# Patient Record
Sex: Male | Born: 1970 | Race: White | Hispanic: No | Marital: Married | Smoking: Former smoker
Health system: Southern US, Community
[De-identification: ages and names within clinical notes are randomized; demographics above are authoritative.]

## PROBLEM LIST (undated history)

## (undated) DIAGNOSIS — Z9221 Personal history of antineoplastic chemotherapy: Secondary | ICD-10-CM

## (undated) DIAGNOSIS — Z21 Asymptomatic human immunodeficiency virus [HIV] infection status: Secondary | ICD-10-CM

## (undated) DIAGNOSIS — N289 Disorder of kidney and ureter, unspecified: Secondary | ICD-10-CM

## (undated) DIAGNOSIS — I10 Essential (primary) hypertension: Secondary | ICD-10-CM

## (undated) DIAGNOSIS — C801 Malignant (primary) neoplasm, unspecified: Secondary | ICD-10-CM

## (undated) DIAGNOSIS — G629 Polyneuropathy, unspecified: Secondary | ICD-10-CM

## (undated) DIAGNOSIS — B20 Human immunodeficiency virus [HIV] disease: Secondary | ICD-10-CM

## (undated) HISTORY — PX: FINGER SURGERY: SHX640

## (undated) HISTORY — PX: WISDOM TOOTH EXTRACTION: SHX21

## (undated) HISTORY — PX: APPENDECTOMY: SHX54

## (undated) HISTORY — DX: Essential (primary) hypertension: I10

## (undated) HISTORY — PX: SHOULDER SURGERY: SHX246

---

## 2002-03-23 ENCOUNTER — Ambulatory Visit (HOSPITAL_COMMUNITY): Admission: RE | Admit: 2002-03-23 | Discharge: 2002-03-24 | Payer: Self-pay | Admitting: Surgery

## 2002-03-23 ENCOUNTER — Encounter (INDEPENDENT_AMBULATORY_CARE_PROVIDER_SITE_OTHER): Payer: Self-pay | Admitting: *Deleted

## 2004-10-09 ENCOUNTER — Encounter: Admission: RE | Admit: 2004-10-09 | Discharge: 2004-10-09 | Payer: Self-pay | Admitting: General Surgery

## 2008-01-11 ENCOUNTER — Emergency Department (HOSPITAL_COMMUNITY): Admission: EM | Admit: 2008-01-11 | Discharge: 2008-01-11 | Payer: Self-pay | Admitting: Emergency Medicine

## 2010-12-05 NOTE — Op Note (Signed)
NAMEGARRISON, Joshua Zhang                        ACCOUNT NO.:  [id number]   MEDICAL RECORD NO.:  [id number]                   PATIENT TYPE:  OIB   LOCATION:  2899                                 FACILITY:  [facility]   PHYSICIAN:  Velora Heckler, M.D.                DATE OF BIRTH:  03-May-1971   DATE OF PROCEDURE:  03/23/2002  DATE OF DISCHARGE:                                 OPERATIVE REPORT   PREOPERATIVE DIAGNOSIS:  Pilonidal cyst with abscess.   POSTOPERATIVE DIAGNOSIS:  Pilonidal cyst with abscess.   PROCEDURE:  Excision, pilonidal cyst, with drainage of abscess and open  packing.   SURGEON:  Velora Heckler, M.D.   ANESTHESIA:  General per Quita Skye. Krista Blue, M.D.   ESTIMATED BLOOD LOSS:  Minimal.   PREPARATION:  Betadine.   COMPLICATIONS:  None.   INDICATIONS:  The patient is a 40 year old white male who presented to our  office at [facility] on 03/22/02 with pilonidal cyst with  abscess.  The patient was seen and evaluated by Gita Kudo, M.D.  He  was scheduled for surgical intervention on 03/23/02 at [facility].   DESCRIPTION OF PROCEDURE:  The procedure was done in OR #3 at the [facility].  The patient was brought to the operating room and  following induction of general anesthesia, the patient is turned to a prone  position on the operating room table.  The buttocks and natal cleft is  prepped and draped in the usual strict aseptic fashion.  After ascertaining  that an adequate level of anesthesia had been obtained, an elliptical  incision is made in the natal cleft so as to excise the pilonidal sinus  tract in the midline.  Dissection is carried into the subcutaneous tissue.  A large abscess cavity is encountered, tracking into the left buttock.  This  is also excised and the abscess wall debrided.  The pilonidal tissue is  excised down to the sacrum and coccyx.  It is submitted to pathology for  review.  Hemostasis is  obtained with the electrocautery.  The wound is  copiously irrigated with warm saline.  Local field block is placed with  Marcaine with epinephrine.  The wound was packed with Betadine-soaked Kerlix  gauze.  Dry gauze dressings and an ABD pad are placed.  The patient is  awakened from anesthesia and brought to the recovery room in stable  condition.  The patient tolerated the procedure well.                                               Velora Heckler, M.D.    TMG/MEDQ  D:  03/23/2002  T:  03/24/2002  Job:  [id number]   cc:   Teena Irani.  Arlyce Dice, M.D.

## 2010-12-05 NOTE — Op Note (Signed)
   NAMEQASIM, DIVELEY                        ACCOUNT NO.:  [id number]   MEDICAL RECORD NO.:  [id number]                   PATIENT TYPE:  OIB   LOCATION:  2899                                 FACILITY:  [facility]   PHYSICIAN:  Velora Heckler, M.D.                DATE OF BIRTH:  02-27-1971   DATE OF PROCEDURE:  03/23/2002  DATE OF DISCHARGE:                                 OPERATIVE REPORT   ADDENDUM:  I saw him in the office yesterday.  His chart number is [id number].  He underwent excision, pilonidal cyst, with drainage of abscess and open  packing by Gerkin, no assist.  He will go up to the floor.                                               Velora Heckler, M.D.    TMG/MEDQ  D:  03/23/2002  T:  03/24/2002  Job:  [id number]

## 2016-04-22 DIAGNOSIS — B360 Pityriasis versicolor: Secondary | ICD-10-CM | POA: Insufficient documentation

## 2016-11-05 ENCOUNTER — Telehealth: Payer: Self-pay

## 2016-11-05 NOTE — Telephone Encounter (Signed)
Referral received from [facility].   Patient tested positive through testing for life insurance.   His wife tested negative but is interested in Prep.   We will schedule patient for appointment.

## 2016-11-10 ENCOUNTER — Ambulatory Visit: Payer: Self-pay | Admitting: Internal Medicine

## 2016-11-11 ENCOUNTER — Encounter: Payer: Self-pay | Admitting: Internal Medicine

## 2016-11-11 ENCOUNTER — Other Ambulatory Visit (HOSPITAL_COMMUNITY)
Admission: RE | Admit: 2016-11-11 | Discharge: 2016-11-11 | Disposition: A | Discharge status: Home / Self Care | Payer: BLUE CROSS/BLUE SHIELD | Source: Ambulatory Visit | Attending: Internal Medicine | Admitting: Internal Medicine

## 2016-11-11 ENCOUNTER — Ambulatory Visit (INDEPENDENT_AMBULATORY_CARE_PROVIDER_SITE_OTHER): Payer: BLUE CROSS/BLUE SHIELD | Admitting: Internal Medicine

## 2016-11-11 DIAGNOSIS — B20 Human immunodeficiency virus [HIV] disease: Secondary | ICD-10-CM

## 2016-11-11 LAB — CBC
HEMATOCRIT: 45.2 % (ref 38.5–50.0)
Hemoglobin: 15.8 g/dL (ref 13.2–17.1)
MCH: 32.6 pg (ref 27.0–33.0)
MCHC: 35 g/dL (ref 32.0–36.0)
MCV: 93.2 fL (ref 80.0–100.0)
MPV: 9 fL (ref 7.5–12.5)
PLATELETS: 237 10*3/uL (ref 140–400)
RBC: 4.85 MIL/uL (ref 4.20–5.80)
RDW: 12.9 % (ref 11.0–15.0)
WBC: 6.9 10*3/uL (ref 3.8–10.8)

## 2016-11-11 MED ORDER — DARUNAVIR-COBICISTAT 800-150 MG PO TABS: 1.0000 | ORAL_TABLET | Freq: Every day | ORAL | 6 refills | Status: DC

## 2016-11-11 MED ORDER — BICTEGRAVIR-EMTRICITAB-TENOFOV 50-200-25 MG PO TABS: 1.0000 | ORAL_TABLET | Freq: Every day | ORAL | 6 refills | Status: DC

## 2016-11-11 NOTE — Progress Notes (Signed)
HPI: Joshua Zhang is a 46 y.o. male who is here for his initial HIV visit today.   Allergies: Not on File  Vitals: Temp: 98 F (36.7 C) (04/25 1026) Temp Source: Oral (04/25 1026) BP: 163/107 (04/25 1026) Pulse Rate: 87 (04/25 1026)  Past Medical History: No past medical history on file.  Social History: Social History   Social History  . Marital status: Single    Spouse name: N/A  . Number of children: N/A  . Years of education: N/A   Social History Main Topics  . Smoking status: Former Research scientist (life sciences)  . Smokeless tobacco: Never Used  . Alcohol use Yes     Comment: occasionally   . Drug use: No  . Sexual activity: Not Currently     Comment: not for last 3 weeks    Other Topics Concern  . None   Social History Narrative  . None    Previous Regimen: None  Current Regimen: None  Labs: No results found for: HIV1RNAQUANT, HIV1RNAVL, CD4TABS, HEPBSAB, HEPBSAG, HCVAB  CrCl: CrCl cannot be calculated (No order found.).  Lipids: No results found for: CHOL, TRIG, HDL, CHOLHDL, VLDL, LDLCALC  Assessment: Joshua Zhang was recently dx with HIV through the health department as part of the life insurance screening. He was denied coverage because of the test. Since he has no labs, Dr. Megan Zhang will start him on Prezcobix/Biktarvy until we know the genotype. Counseled him on side effects and how to take it. He has [employer]. Both meds will require prior authorization as well has his wife Joshua Zhang for PreP. Her test was neg yesterday.   Recommendations:  Start Prezcobix/Biktarvy Joshua Zhang did his PA  Joshua Zhang, PharmD, BCPS, AAHIVP, CPP Clinical Infectious Disease Pharmacist [place] [facility] 11/11/2016, 12:10 PM

## 2016-11-11 NOTE — Patient Instructions (Signed)
We are starting you on two medications today - Bictarvy and Prezcobix. These medications will need to be taken once a day together. It is best to take them around the same time every day.   Labs today.   Back to see Dr. Megan Salon in 3 weeks.

## 2016-11-11 NOTE — Assessment & Plan Note (Addendum)
I discussed with Joshua Zhang treatment options and side effects, benefits of treatment, adherence to therapy and long-term outcomes.  I discussed the process of HIV acquisition/transmission and informed him of the benefits of treatment in preventing opportunistic infections and end organ damage. Counseled regarding routine HIV follow-up care, routine blood monitoring, importance of medication adherence, safe sex practices including abstinence for the next 4-6 weeks while he is started on therapy and Joshua Zhang starts on Prep. He interacted appropriately and all questions have been answered at this time. He spent time talking with our pharmacist Joshua Zhang regarding successful practices of ART and understands to reach out to our clinic in the future with questions.   We decided to start Joshua Zhang on Bictarvy and Prezcobix today while we await resistance testing. He will receive medications from the Joshua Zhang as of now ([facility]ua Zhang's if insurance requires specialty pharmacy). Will return to clinic in 3 weeks to review lab work and continue HIV education.

## 2016-11-11 NOTE — Progress Notes (Signed)
Patient Active Problem List   Diagnosis Date Noted  . HIV disease ([place]) 11/11/2016    Patient's Medications  New Prescriptions   BICTEGRAVIR-EMTRICITABINE-TENOFOVIR AF (BIKTARVY) 50-200-25 MG TABS TABLET    Take 1 tablet by mouth daily.   DARUNAVIR-COBICISTAT (PREZCOBIX) 800-150 MG TABLET    Take 1 tablet by mouth daily with breakfast. Swallow whole. Do NOT crush, break or chew tablets. Take with food.  Previous Medications   MULTIPLE VITAMIN (MULTIVITAMIN) TABLET    Take 1 tablet by mouth daily.  Modified Medications   No medications on file  Discontinued Medications   No medications on file    Subjective: Joshua Zhang is here today for his first visit for HIV care with his wife, Joshua Zhang. He was tested recently at the [facility] as part of a requirement for life insurance and was found to be positive. 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 He has never been tested to his knowledge in the past. Since learning of his positive test he admits that he has had some occasional episodes of sadness, concern over his wife and their relationship as well as how this will affect his life in the long-run. No other concerns regarding his health at this time. He is currently in a monogamous relationship with his wife and has not engaged in sexual activity in last 3 weeks since learning of his diagnosis. They have not used condoms as Joshua Zhang has an IUD for contraception. He denies any other sexual encounters (including MSM) outside of their marriage as well as denies IV drug use or other high risk behaviors. No other people know of his infection at this time.   Joshua Zhang is anxious to learn of her lab results today and learn about the possibility of getting started on PreP.   Review of Systems: Review of Systems  Constitutional: Negative for chills, fever, malaise/fatigue and weight loss.  HENT: Negative for sore throat.    Eyes: Negative for blurred vision and pain.  Respiratory: Negative for cough, sputum production and shortness of breath.   Cardiovascular: Negative for chest pain and leg swelling.  Gastrointestinal: Negative for abdominal pain, diarrhea and vomiting.  Genitourinary: Negative for dysuria, flank pain and urgency.  Musculoskeletal: Negative for joint pain, myalgias and neck pain.  Skin: Negative for rash.  Neurological: Negative for dizziness and headaches.  Psychiatric/Behavioral: Negative for substance abuse. Depression: Reports episodes of saddness since learning of diagnosis. The patient is nervous/anxious.    History reviewed. No pertinent past medical history.  Social History  Substance Use Topics  . Smoking status: Former Research scientist (life sciences)  . Smokeless tobacco: Never Used     Comment: Smoked in 75s  . Alcohol use Yes     Comment: occasionally     Family History  Problem Relation Age of Onset  . Aortic aneurysm Father     Not on File  Objective:  Vitals:   11/11/16 1026  BP: (!) 163/107  Pulse: 87  Temp: 98 F (36.7 C)  TempSrc: Oral  Weight: 201 lb 6.4 oz (91.4 kg)   There is no height or weight on file to calculate BMI.  Physical Exam  Constitutional: He is oriented to person, place, and time and well-developed, well-nourished, and in no distress.  Seated comfortably in chair today with his wife  HENT:  Mouth/Throat: Mucous membranes are normal. Normal dentition (small chipped tooth). No dental abscesses.  Eyes: No scleral icterus.  Cardiovascular: Normal rate, regular rhythm and normal heart sounds.   Pulmonary/Chest: Effort normal and breath sounds normal.  Abdominal: Soft. He exhibits no distension. There is no tenderness.  Musculoskeletal: Normal range of motion.  Lymphadenopathy:    He has no cervical adenopathy.  Neurological: He is alert and oriented to person, place, and time.  Skin: Skin is warm and dry. No rash noted.  Psychiatric: Mood and affect  normal.  Nervous appearing today but engaged in discussion of his care.   Vitals reviewed.   Lab Results No results found for: WBC, HGB, HCT, MCV, PLT No results found for: CREATININE, BUN, NA, K, CL, CO2 No results found for: ALT, AST, GGT, ALKPHOS, BILITOT  No results found for: CHOL, HDL, LDLCALC, LDLDIRECT, TRIG, CHOLHDL No results found for: HIV1RNAQUANT, HIV1RNAVL, CD4TABS No results found for: HIV1GENOSEQ No results found for: HAV No results found for: HEPBSAG, HEPBSAB No results found for: HCVAB No results found for: CHLAMYDIAWP, N No results found for: GCPROBEAPT No results found for: QUANTGOLD   Problem List Items Addressed This Visit      Other   HIV disease ([place])    I discussed with Joshua Zhang treatment options and side effects, benefits of treatment, adherence to therapy and long-term outcomes.  I discussed the process of HIV acquisition/transmission and informed him of the benefits of treatment in preventing opportunistic infections and end organ damage. Counseled regarding routine HIV follow-up care, routine blood monitoring, importance of medication adherence, safe sex practices including abstinence for the next 4-6 weeks while he is started on therapy and Joshua Zhang starts on Prep. He interacted appropriately and all questions have been answered at this time. He spent time talking with our pharmacist Joshua Zhang regarding successful practices of ART and understands to reach out to our clinic in the future with questions.   We decided to start [facility] on Bictarvy and Prezcobix today while we await resistance testing. He will receive medications from the Joshua Zhang as of now ([facility] Zhang's if insurance requires specialty pharmacy). Will return to clinic in 3 weeks to review lab work and continue HIV education.        Relevant Medications   bictegravir-emtricitabine-tenofovir AF (BIKTARVY) 50-200-25 MG TABS tablet   darunavir-cobicistat (PREZCOBIX) 800-150 MG tablet    Other Relevant Orders   T-helper cell (CD4)- ([facility] clinic only)   HIV 1 RNA quant-no reflex-bld   CBC   Comprehensive metabolic panel   RPR   Lipid panel   HIV-1 genotypr plus   HIV-1 Integrase Genotype   HLA B*[id number]   Urine cytology ancillary only   Quantiferon tb gold assay (blood)   Hepatitis A Ab, Total   Hepatitis B Surface AntiGEN   Hepatitis B Surface AntiBODY   Hepatitis C Antibody      Janene Madeira, MSN, NP-C [facility] Infectious Disease [facility] Cell: [phone] Pager: [phone]  11/11/16 12:20 PM

## 2016-11-12 LAB — COMPREHENSIVE METABOLIC PANEL
ALK PHOS: 82 U/L (ref 40–115)
ALT: 26 U/L (ref 9–46)
AST: 28 U/L (ref 10–40)
Albumin: 4.3 g/dL (ref 3.6–5.1)
BILIRUBIN TOTAL: 0.7 mg/dL (ref 0.2–1.2)
BUN: 17 mg/dL (ref 7–25)
CALCIUM: 9.3 mg/dL (ref 8.6–10.3)
CO2: 20 mmol/L (ref 20–31)
CREATININE: 0.95 mg/dL (ref 0.60–1.35)
Chloride: 105 mmol/L (ref 98–110)
GLUCOSE: 100 mg/dL — AB (ref 65–99)
Potassium: 3.9 mmol/L (ref 3.5–5.3)
SODIUM: 139 mmol/L (ref 135–146)
Total Protein: 7.1 g/dL (ref 6.1–8.1)

## 2016-11-12 LAB — LIPID PANEL
Cholesterol: 243 mg/dL — ABNORMAL HIGH (ref <200)
HDL: 47 mg/dL (ref >40)
LDL CALC: 134 mg/dL — AB (ref <100)
TRIGLYCERIDES: 311 mg/dL — AB (ref <150)
Total CHOL/HDL Ratio: 5.2 Ratio — ABNORMAL HIGH (ref <5.0)
VLDL: 62 mg/dL — AB (ref <30)

## 2016-11-12 LAB — HEPATITIS B SURFACE ANTIBODY,QUALITATIVE: HEP B S AB: NEGATIVE

## 2016-11-12 LAB — URINE CYTOLOGY ANCILLARY ONLY
Chlamydia: NEGATIVE
Neisseria Gonorrhea: NEGATIVE

## 2016-11-12 LAB — RPR

## 2016-11-12 LAB — HEPATITIS B SURFACE ANTIGEN: Hepatitis B Surface Ag: NEGATIVE

## 2016-11-12 LAB — HEPATITIS A ANTIBODY, TOTAL: HEP A TOTAL AB: NONREACTIVE

## 2016-11-12 LAB — HEPATITIS C ANTIBODY: HCV AB: NEGATIVE

## 2016-11-12 MED FILL — BIKTARVY 50-200-25 MG TABS: 50-200-25 | 30 days supply | Qty: 30 | Fill #0

## 2016-11-13 LAB — QUANTIFERON TB GOLD ASSAY (BLOOD)
Interferon Gamma Release Assay: NEGATIVE
Mitogen-Nil: 8.5 IU/mL
QUANTIFERON NIL VALUE: 0.05 [IU]/mL
Quantiferon Tb Ag Minus Nil Value: 0.01 IU/mL

## 2016-11-13 LAB — T-HELPER CELL (CD4) - (RCID CLINIC ONLY)
CD4 % Helper T Cell: 25 % — ABNORMAL LOW (ref 33–55)
CD4 T Cell Abs: 290 /uL — ABNORMAL LOW (ref 400–2700)

## 2016-11-13 LAB — HIV-1 RNA QUANT-NO REFLEX-BLD
HIV 1 RNA Quant: 15400 copies/mL — ABNORMAL HIGH
HIV-1 RNA Quant, Log: 4.19 Log copies/mL — ABNORMAL HIGH

## 2016-11-16 MED FILL — PREZCOBIX 800 MG-150 MG TAB: 800-150 | 30 days supply | Qty: 30 | Fill #0

## 2016-11-19 ENCOUNTER — Encounter: Payer: Self-pay | Admitting: *Deleted

## 2016-11-19 LAB — HIV-1 INTEGRASE GENOTYPE

## 2016-11-19 LAB — HLA B*5701: HLA-B 5701 W/RFLX HLA-B HIGH: POSITIVE — AB

## 2016-11-20 LAB — HIV-1 GENOTYPR PLUS

## 2016-12-01 ENCOUNTER — Encounter: Payer: Self-pay | Admitting: Internal Medicine

## 2016-12-01 ENCOUNTER — Ambulatory Visit (INDEPENDENT_AMBULATORY_CARE_PROVIDER_SITE_OTHER): Payer: BLUE CROSS/BLUE SHIELD | Admitting: Pharmacist Clinician (PhC)/ Clinical Pharmacy Specialist

## 2016-12-01 VITALS — BP 161/99 | HR 99 | Temp 98.7°F | Wt 202.0 lb

## 2016-12-01 DIAGNOSIS — B20 Human immunodeficiency virus [HIV] disease: Secondary | ICD-10-CM

## 2016-12-01 NOTE — Progress Notes (Signed)
HPI: Joshua Zhang is a 46 y.o. male who is here for follow up with [place] after starting his ART.  Allergies: Not on File  Vitals: Temp: 98.7 F (37.1 C) (05/15 0939) Temp Source: Oral (05/15 0939) BP: 161/99 (05/15 0939) Pulse Rate: 99 (05/15 0939)  Past Medical History: No past medical history on file.  Social History: Social History   Social History  . Marital status: Married    Spouse name: [name]  . Number of children: 1  . Years of education: some college    Occupational History  . Sales     Previously professional Chiropodist    Social History Main Topics  . Smoking status: Former Research scientist (life sciences)  . Smokeless tobacco: Never Used     Comment: Smoked in 48s  . Alcohol use Yes     Comment: occasionally   . Drug use: No  . Sexual activity: Not Currently    Partners: Female     Comment: not for last 3 weeks, no condom use   Other Topics Concern  . None   Social History Narrative  . None    Previous Regimen: None  Current Regimen: Prezcobix/Biktarvy  Labs: HIV 1 RNA Quant (copies/mL)  Date Value  11/11/2016 15,400 (H)   CD4 T Cell Abs (/uL)  Date Value  11/11/2016 290 (L)   Hep B S Ab (no units)  Date Value  11/11/2016 NEG   Hepatitis B Surface Ag (no units)  Date Value  11/11/2016 NEGATIVE   HCV Ab (no units)  Date Value  11/11/2016 NEGATIVE    CrCl: CrCl cannot be calculated (Unknown ideal weight.).  Lipids:    Component Value Date/Time   CHOL 243 (H) 11/11/2016 1140   TRIG 311 (H) 11/11/2016 1140   HDL 47 11/11/2016 1140   CHOLHDL 5.2 (H) 11/11/2016 1140   VLDL 62 (H) 11/11/2016 1140   LDLCALC 134 (H) 11/11/2016 1140    Assessment: [place] was recently dx with HIV and is here with his wife. He started on Biktarvy and Prezcobix while waiting for the genotype. His genotype came back with wild type virus. Therefore, we are going to stop the Prezcobix today. He tolerates them fine so far without any side effects. He is going to come  back and see me in 4 wks for labs. I'll schedule him to come back to see Dr. Megan Salon at a later time at that visit. Encourage him to not miss any doses.   Recommendations:  Stop Prezcobix Cont Biktarvy 1 PO qday F/u with me in 4 wks for labs  Onnie Boer, PharmD, BCPS, AAHIVP, CPP Clinical Infectious [place] for Infectious Disease 12/01/2016, 10:03 AM

## 2016-12-15 MED FILL — BIKTARVY 50-200-25 MG TABS: 50-200-25 | 30 days supply | Qty: 30 | Fill #1

## 2016-12-29 ENCOUNTER — Ambulatory Visit (INDEPENDENT_AMBULATORY_CARE_PROVIDER_SITE_OTHER): Payer: BLUE CROSS/BLUE SHIELD | Admitting: Pharmacist Clinician (PhC)/ Clinical Pharmacy Specialist

## 2016-12-29 DIAGNOSIS — B20 Human immunodeficiency virus [HIV] disease: Secondary | ICD-10-CM

## 2016-12-29 NOTE — Patient Instructions (Signed)
Come back and see me in July for labs F/u with Dr. Megan Salon in Sept

## 2016-12-29 NOTE — Progress Notes (Signed)
HPI: Joshua Zhang is a 46 y.o. male who is here for his f/u with pharmacy after a recent start on his ART.   Allergies: Not on File  Vitals:    Past Medical History: No past medical history on file.  Social History: Social History   Social History  . Marital status: Married    Spouse name: [name]  . Number of children: 1  . Years of education: some college    Occupational History  . Sales     Previously professional Chiropodist    Social History Main Topics  . Smoking status: Former Research scientist (life sciences)  . Smokeless tobacco: Never Used     Comment: Smoked in 61s  . Alcohol use Yes     Comment: occasionally   . Drug use: No  . Sexual activity: Not Currently    Partners: Female     Comment: not for last 3 weeks, no condom use   Other Topics Concern  . Not on file   Social History Narrative  . No narrative on file    Previous Regimen: Prezcobix/Biktarvy  Current Regimen: Biktarvy  Labs: HIV 1 RNA Quant (copies/mL)  Date Value  11/11/2016 15,400 (H)   CD4 T Cell Abs (/uL)  Date Value  11/11/2016 290 (L)   Hep B S Ab (no units)  Date Value  11/11/2016 NEG   Hepatitis B Surface Ag (no units)  Date Value  11/11/2016 NEGATIVE   HCV Ab (no units)  Date Value  11/11/2016 NEGATIVE    CrCl: CrCl cannot be calculated (Patient's most recent lab result is older than the maximum 21 days allowed.).  Lipids:    Component Value Date/Time   CHOL 243 (H) 11/11/2016 1140   TRIG 311 (H) 11/11/2016 1140   HDL 47 11/11/2016 1140   CHOLHDL 5.2 (H) 11/11/2016 1140   VLDL 62 (H) 11/11/2016 1140   LDLCALC 134 (H) 11/11/2016 1140    Assessment: [place] is doing very well on his regimen and has not experienced any side effects. He was late for 1 dose by a couple of hours but he didn't miss it. His baseline VL was not that high so I hope he will be suppressed very quickly. He will need to start several series of vaccines soon. (Hep A/B, Menveo, PNA). Bring him back next  month to make sure he is suppressed before an appt in Sept with Dr. Megan Salon.   His wife is on PreP right now. Discussed with him the low risk (none) for transmission if he is suppressed and his wife is on PreP.   Recommendations:  HIV VL, CD4, Bmet F/u in July for VL, vaccines F/u with Dr. Megan Salon in Sept  Edon Hoadley, PharmD, BCPS, [place], [place] for Infectious Disease 12/29/2016, 3:52 PM

## 2016-12-30 LAB — BASIC METABOLIC PANEL
BUN: 18 mg/dL (ref 7–25)
CALCIUM: 9.3 mg/dL (ref 8.6–10.3)
CO2: 23 mmol/L (ref 20–31)
Chloride: 103 mmol/L (ref 98–110)
Creat: 1.43 mg/dL — ABNORMAL HIGH (ref 0.60–1.35)
GLUCOSE: 91 mg/dL (ref 65–99)
POTASSIUM: 3.9 mmol/L (ref 3.5–5.3)
SODIUM: 138 mmol/L (ref 135–146)

## 2016-12-30 LAB — T-HELPER CELL (CD4) - (RCID CLINIC ONLY)
CD4 T CELL ABS: 450 /uL (ref 400–2700)
CD4 T CELL HELPER: 26 % — AB (ref 33–55)

## 2017-01-02 LAB — HIV-1 RNA QUANT-NO REFLEX-BLD
HIV 1 RNA QUANT: NOT DETECTED {copies}/mL
HIV-1 RNA QUANT, LOG: NOT DETECTED {Log_copies}/mL

## 2017-01-11 MED FILL — BIKTARVY 50-200-25 MG TABS: 50-200-25 | 30 days supply | Qty: 30 | Fill #2

## 2017-02-02 ENCOUNTER — Ambulatory Visit (INDEPENDENT_AMBULATORY_CARE_PROVIDER_SITE_OTHER): Payer: BLUE CROSS/BLUE SHIELD | Admitting: Pharmacist Clinician (PhC)/ Clinical Pharmacy Specialist

## 2017-02-02 DIAGNOSIS — B20 Human immunodeficiency virus [HIV] disease: Secondary | ICD-10-CM | POA: Diagnosis not present

## 2017-02-02 DIAGNOSIS — Z23 Encounter for immunization: Secondary | ICD-10-CM | POA: Diagnosis not present

## 2017-02-02 NOTE — Progress Notes (Signed)
HPI: Joshua Zhang is a 46 y.o. male who is here for f/u with pharmacy for his HIV.   Allergies: Not on File  Vitals:    Past Medical History: No past medical history on file.  Social History: Social History   Social History  . Marital status: Married    Spouse name: Joshua Zhang  . Number of children: 1  . Years of education: some college    Occupational History  . Sales     Previously professional Chiropodist    Social History Main Topics  . Smoking status: Former Research scientist (life sciences)  . Smokeless tobacco: Never Used     Comment: Smoked in 51s  . Alcohol use Yes     Comment: occasionally   . Drug use: No  . Sexual activity: Not Currently    Partners: Female     Comment: not for last 3 weeks, no condom use   Other Topics Concern  . Not on file   Social History Narrative  . No narrative on file    Previous Regimen: None  Current Regimen: Biktarvy  Labs: HIV 1 RNA Quant (copies/mL)  Date Value  12/29/2016 <20 NOT DETECTED  11/11/2016 15,400 (H)   CD4 T Cell Abs (/uL)  Date Value  12/29/2016 450  11/11/2016 290 (L)   Hep B S Ab (no units)  Date Value  11/11/2016 NEG   Hepatitis B Surface Ag (no units)  Date Value  11/11/2016 NEGATIVE   HCV Ab (no units)  Date Value  11/11/2016 NEGATIVE    CrCl: CrCl cannot be calculated (Patient's most recent lab result is older than the maximum 21 days allowed.).  Lipids:    Component Value Date/Time   CHOL 243 (H) 11/11/2016 1140   TRIG 311 (H) 11/11/2016 1140   HDL 47 11/11/2016 1140   CHOLHDL 5.2 (H) 11/11/2016 1140   VLDL 62 (H) 11/11/2016 1140   LDLCALC 134 (H) 11/11/2016 1140    Assessment: Joshua Zhang has done extremely well on Biktarvy. He is now suppressed and CD4 has increased to 450. He has not experience any side effects. He has if he could change the timing of his ART to evening. Told him that it would be fine to do.   He might be going to [place] for about 2 wks. He asked if the pharmacy can fill a 90  days supply instead. Advised him that it's not up to the pharmacy but the plan instead. He has about 2 wks left of the med. He can probably refill it in about 7 days. If needed, we probably can get his insurance to do a 2 month supply.   His wife is on PrEP. Explained to him that the risk of transmission is extremely low at this point. Otherwise, he is doing very well. Pharmacy was going to see him until he is suppressed before seeing Dr. Megan Zhang. His next appt will be with Dr. Megan Zhang in Sept.   We are going to give him the PNA, hep A/B, Menveo vaccine at this visit.   Recommendations:  Continue Biktarvy 1 PO qday Hep A/B #1 today PNA23 Menveo #1 F/u with Dr. Megan Zhang in Sept ([place] #2)  Onnie Boer, PharmD, BCPS, AAHIVP, CPP Clinical Infectious [place] for Infectious Disease 02/02/2017, 8:16 PM

## 2017-02-02 NOTE — Patient Instructions (Signed)
Continue your Biktavy Follow up with Dr. Megan Salon in Sept

## 2017-02-11 MED FILL — BIKTARVY 50-200-25 MG TABS: 50-200-25 | 30 days supply | Qty: 30 | Fill #3

## 2017-03-09 MED FILL — BIKTARVY 50-200-25 MG TABS: 50-200-25 | 30 days supply | Qty: 30 | Fill #4

## 2017-03-15 ENCOUNTER — Telehealth: Payer: Self-pay | Admitting: Internal Medicine

## 2017-03-15 ENCOUNTER — Other Ambulatory Visit: Payer: Self-pay | Admitting: Pharmacist

## 2017-03-15 NOTE — Telephone Encounter (Signed)
-----   Message from Darletta Moll, [facility] sent at 03/15/2017  3:34 PM EDT ----- Just FYI -- I saw his wife today for PrEP.  She said he's still having a very difficult time with his diagnosis.  She states some days he just sits and cries the whole day.  She had tried comforting him but states she thinks he would benefit from talking to someone.  He has an appt with you on 9/13 -- just a heads up that he may need some extra support when he's here.  Thanks!

## 2017-03-31 DIAGNOSIS — E785 Hyperlipidemia, unspecified: Secondary | ICD-10-CM | POA: Insufficient documentation

## 2017-03-31 DIAGNOSIS — N289 Disorder of kidney and ureter, unspecified: Secondary | ICD-10-CM | POA: Insufficient documentation

## 2017-04-01 ENCOUNTER — Ambulatory Visit (INDEPENDENT_AMBULATORY_CARE_PROVIDER_SITE_OTHER): Payer: BLUE CROSS/BLUE SHIELD | Admitting: Internal Medicine

## 2017-04-01 ENCOUNTER — Encounter: Payer: Self-pay | Admitting: Internal Medicine

## 2017-04-01 VITALS — BP 149/104 | HR 82 | Temp 98.2°F | Wt 210.0 lb

## 2017-04-01 DIAGNOSIS — Z23 Encounter for immunization: Secondary | ICD-10-CM

## 2017-04-01 DIAGNOSIS — I10 Essential (primary) hypertension: Secondary | ICD-10-CM | POA: Insufficient documentation

## 2017-04-01 DIAGNOSIS — B20 Human immunodeficiency virus [HIV] disease: Secondary | ICD-10-CM | POA: Diagnosis not present

## 2017-04-01 DIAGNOSIS — N289 Disorder of kidney and ureter, unspecified: Secondary | ICD-10-CM

## 2017-04-01 DIAGNOSIS — E785 Hyperlipidemia, unspecified: Secondary | ICD-10-CM

## 2017-04-01 NOTE — Assessment & Plan Note (Signed)
His blood pressure has been elevated all 3 times he has been here in the past few months. He has no known history of hypertension. He will be having an insurance physical work today and will follow up with his primary care provider.

## 2017-04-01 NOTE — Progress Notes (Signed)
Patient Active Problem List   Diagnosis Date Noted  . HIV disease ([place]) 11/11/2016    Priority: High  . Hypertension 04/01/2017  . Renal insufficiency 03/31/2017  . Dyslipidemia 03/31/2017    Patient's Medications  New Prescriptions   No medications on file  Previous Medications   BICTEGRAVIR-EMTRICITABINE-TENOFOVIR AF (BIKTARVY) 50-200-25 MG TABS TABLET    Take 1 tablet by mouth daily.   MULTIPLE VITAMIN (MULTIVITAMIN) TABLET    Take 1 tablet by mouth daily.  Modified Medications   No medications on file  Discontinued Medications   No medications on file    Subjective: Joshua Zhang is in for his routine HIV follow-up visit. He has had no problems obtaining, taking or tolerating his Biktarvy. He takes it each morning about 6:15 AM. He does not recall missing doses. He states that he has had a few occasions where he became very anxious and depressed about living with HIV infection but overall he is doing better. His wife, Joshua Zhang, is taking PrEP.   Review of Systems: Review of Systems  Constitutional: Negative for chills, diaphoresis, fever, malaise/fatigue and weight loss.  HENT: Negative for sore throat.   Respiratory: Negative for cough, sputum production and shortness of breath.   Cardiovascular: Negative for chest pain.  Gastrointestinal: Negative for abdominal pain, diarrhea, heartburn, nausea and vomiting.  Genitourinary: Negative for dysuria and frequency.  Musculoskeletal: Negative for joint pain and myalgias.  Skin: Negative for rash.  Neurological: Negative for dizziness and headaches.  Psychiatric/Behavioral: Positive for depression. Negative for substance abuse. The patient is nervous/anxious.     No past medical history on file.  Social History  Substance Use Topics  . Smoking status: Former Research scientist (life sciences)  . Smokeless tobacco: Never Used     Comment: Smoked in 29s  . Alcohol use Yes     Comment: occasionally     Family History  Problem Relation Age of  Onset  . Aortic aneurysm Father     Not on File  Objective:  Vitals:   04/01/17 0836  BP: (!) 149/104  Pulse: 82  Temp: 98.2 F (36.8 C)  TempSrc: Oral  Weight: 210 lb (95.3 kg)   There is no height or weight on file to calculate BMI.  Physical Exam  Constitutional: He is oriented to person, place, and time.  HENT:  Mouth/Throat: No oropharyngeal exudate.  Eyes: Conjunctivae are normal.  Cardiovascular: Normal rate and regular rhythm.   No murmur heard. Pulmonary/Chest: Effort normal and breath sounds normal.  Abdominal: Soft. He exhibits no mass. There is no tenderness.  Musculoskeletal: Normal range of motion.  Neurological: He is alert and oriented to person, place, and time.  Skin: No rash noted.  Psychiatric: Mood and affect normal.    Lab Results Lab Results  Component Value Date   WBC 6.9 11/11/2016   HGB 15.8 11/11/2016   HCT 45.2 11/11/2016   MCV 93.2 11/11/2016   PLT 237 11/11/2016    Lab Results  Component Value Date   CREATININE 1.43 (H) 12/29/2016   BUN 18 12/29/2016   NA 138 12/29/2016   K 3.9 12/29/2016   CL 103 12/29/2016   CO2 23 12/29/2016    Lab Results  Component Value Date   ALT 26 11/11/2016   AST 28 11/11/2016   ALKPHOS 82 11/11/2016   BILITOT 0.7 11/11/2016    Lab Results  Component Value Date   CHOL 243 (H) 11/11/2016   HDL 47 11/11/2016  LDLCALC 134 (H) 11/11/2016   TRIG 311 (H) 11/11/2016   CHOLHDL 5.2 (H) 11/11/2016   Lab Results  Component Value Date   LABRPR NON REAC 11/11/2016   HIV 1 RNA Quant (copies/mL)  Date Value  12/29/2016 <20 NOT DETECTED  11/11/2016 15,400 (H)   CD4 T Cell Abs (/uL)  Date Value  12/29/2016 450  11/11/2016 290 (L)     Problem List Items Addressed This Visit      High   HIV disease ([place])    His infection has come under excellent control. He will continue Biktarvy in follow-up after lab work in 6 months. He got his second hepatitis B vaccine today.      Relevant Orders    T-helper cell (CD4)- ([facility] clinic only)   HIV 1 RNA quant-no reflex-bld   CBC   Comprehensive metabolic panel   Lipid panel   RPR     Unprioritized   Dyslipidemia    He also has dyslipidemia. He will follow-up with his primary care provider. He is getting regular exercise.      Hypertension    His blood pressure has been elevated all 3 times he has been here in the past few months. He has no known history of hypertension. He will be having an insurance physical work today and will follow up with his primary care provider.      Renal insufficiency        Michel Bickers, MD [facility] for Infectious [place] [facility] [phone] pager   [phone] cell 04/01/2017, 8:56 AM

## 2017-04-01 NOTE — Addendum Note (Signed)
Addended by: Aundria Rud on: 04/01/2017 09:45 AM   Modules accepted: Orders

## 2017-04-01 NOTE — Assessment & Plan Note (Signed)
He also has dyslipidemia. He will follow-up with his primary care provider. He is getting regular exercise.

## 2017-04-01 NOTE — Assessment & Plan Note (Signed)
His infection has come under excellent control. He will continue Biktarvy in follow-up after lab work in 6 months. He got his second hepatitis B vaccine today.

## 2017-04-08 MED FILL — BIKTARVY 50-200-25 MG TABS: 50-200-25 | 30 days supply | Qty: 30 | Fill #5

## 2017-04-26 DIAGNOSIS — Z0000 Encounter for general adult medical examination without abnormal findings: Secondary | ICD-10-CM | POA: Diagnosis not present

## 2017-04-26 DIAGNOSIS — E785 Hyperlipidemia, unspecified: Secondary | ICD-10-CM | POA: Diagnosis not present

## 2017-05-05 ENCOUNTER — Ambulatory Visit: Payer: BLUE CROSS/BLUE SHIELD | Admitting: Internal Medicine

## 2017-05-11 DIAGNOSIS — R1032 Left lower quadrant pain: Secondary | ICD-10-CM | POA: Diagnosis not present

## 2017-05-11 MED FILL — BIKTARVY 50-200-25 MG TABS: 50-200-25 | 30 days supply | Qty: 30 | Fill #6

## 2017-06-03 ENCOUNTER — Other Ambulatory Visit: Payer: Self-pay | Admitting: Internal Medicine

## 2017-06-09 MED FILL — BIKTARVY 50-200-25 MG TABS: 50-200-25 | 30 days supply | Qty: 30 | Fill #0

## 2017-07-07 MED FILL — BIKTARVY 50-200-25 MG TABS: 50-200-25 | 30 days supply | Qty: 30 | Fill #1

## 2017-08-05 MED FILL — BIKTARVY 50-200-25 MG TABS: 50-200-25 | 30 days supply | Qty: 30 | Fill #2

## 2017-08-11 DIAGNOSIS — L720 Epidermal cyst: Secondary | ICD-10-CM | POA: Diagnosis not present

## 2017-08-11 DIAGNOSIS — L03211 Cellulitis of face: Secondary | ICD-10-CM | POA: Diagnosis not present

## 2017-08-13 DIAGNOSIS — L720 Epidermal cyst: Secondary | ICD-10-CM | POA: Diagnosis not present

## 2017-08-13 DIAGNOSIS — L03211 Cellulitis of face: Secondary | ICD-10-CM | POA: Diagnosis not present

## 2017-09-06 MED FILL — BIKTARVY 50-200-25 MG TABS: 50-200-25 | 30 days supply | Qty: 30 | Fill #3

## 2017-09-15 ENCOUNTER — Other Ambulatory Visit: Payer: BLUE CROSS/BLUE SHIELD

## 2017-09-15 DIAGNOSIS — B20 Human immunodeficiency virus [HIV] disease: Secondary | ICD-10-CM

## 2017-09-16 LAB — COMPREHENSIVE METABOLIC PANEL
AG Ratio: 1.5 (calc) (ref 1.0–2.5)
ALKALINE PHOSPHATASE (APISO): 88 U/L (ref 40–115)
ALT: 31 U/L (ref 9–46)
AST: 27 U/L (ref 10–40)
Albumin: 4.2 g/dL (ref 3.6–5.1)
BUN: 20 mg/dL (ref 7–25)
CO2: 27 mmol/L (ref 20–32)
CREATININE: 0.98 mg/dL (ref 0.60–1.35)
Calcium: 9.6 mg/dL (ref 8.6–10.3)
Chloride: 104 mmol/L (ref 98–110)
Globulin: 2.8 g/dL (calc) (ref 1.9–3.7)
Glucose, Bld: 89 mg/dL (ref 65–99)
Potassium: 3.8 mmol/L (ref 3.5–5.3)
Sodium: 140 mmol/L (ref 135–146)
Total Bilirubin: 0.7 mg/dL (ref 0.2–1.2)
Total Protein: 7 g/dL (ref 6.1–8.1)

## 2017-09-16 LAB — LIPID PANEL
CHOL/HDL RATIO: 5.3 (calc) — AB (ref <5.0)
Cholesterol: 253 mg/dL — ABNORMAL HIGH (ref <200)
HDL: 48 mg/dL (ref >40)
LDL Cholesterol (Calc): 145 mg/dL (calc) — ABNORMAL HIGH
NON-HDL CHOLESTEROL (CALC): 205 mg/dL — AB (ref <130)
Triglycerides: 387 mg/dL — ABNORMAL HIGH (ref <150)

## 2017-09-16 LAB — CBC
HEMATOCRIT: 44.7 % (ref 38.5–50.0)
Hemoglobin: 16.1 g/dL (ref 13.2–17.1)
MCH: 33.7 pg — ABNORMAL HIGH (ref 27.0–33.0)
MCHC: 36 g/dL (ref 32.0–36.0)
MCV: 93.5 fL (ref 80.0–100.0)
MPV: 9.8 fL (ref 7.5–12.5)
Platelets: 225 10*3/uL (ref 140–400)
RBC: 4.78 10*6/uL (ref 4.20–5.80)
RDW: 12.2 % (ref 11.0–15.0)
WBC: 6.6 10*3/uL (ref 3.8–10.8)

## 2017-09-16 LAB — RPR: RPR Ser Ql: NONREACTIVE

## 2017-09-17 LAB — T-HELPER CELL (CD4) - (RCID CLINIC ONLY)
CD4 % Helper T Cell: 27 % — ABNORMAL LOW (ref 33–55)
CD4 T Cell Abs: 560 /uL (ref 400–2700)

## 2017-09-17 LAB — HIV-1 RNA QUANT-NO REFLEX-BLD
HIV 1 RNA Quant: <20 copies/mL
HIV-1 RNA Quant, Log: <1.3 Log copies/mL

## 2017-09-29 ENCOUNTER — Encounter: Payer: Self-pay | Admitting: Internal Medicine

## 2017-09-29 ENCOUNTER — Ambulatory Visit (INDEPENDENT_AMBULATORY_CARE_PROVIDER_SITE_OTHER): Payer: BLUE CROSS/BLUE SHIELD | Admitting: Internal Medicine

## 2017-09-29 DIAGNOSIS — N289 Disorder of kidney and ureter, unspecified: Secondary | ICD-10-CM | POA: Diagnosis not present

## 2017-09-29 DIAGNOSIS — B20 Human immunodeficiency virus [HIV] disease: Secondary | ICD-10-CM | POA: Diagnosis not present

## 2017-09-29 DIAGNOSIS — M545 Low back pain, unspecified: Secondary | ICD-10-CM | POA: Insufficient documentation

## 2017-09-29 NOTE — Assessment & Plan Note (Addendum)
His infection is now under excellent control with Biktarvy.  His CD4 count is normal.  His adherence is very good.  I encouraged him to fill out his pill canister so that he will have a supply that he can use if he forgets to take it at home.  I talked to him about the risk-benefit decision of having [name] stop Truvada.  As long as his viral load is undetectable his risk of transmitting HIV remains very low.  The decision to stop will need to be up to them.  I had him meet with Onnie Boer our ID pharmacist.  We recommend against weight loss supplements because of the risk of drug drug interactions with Biktarvy.

## 2017-09-29 NOTE — Assessment & Plan Note (Signed)
His creatinine is back to normal.

## 2017-09-29 NOTE — Progress Notes (Signed)
HPI: Joshua Zhang is a 47 y.o. male who is here to see Dr. Megan Salon for his HIV follow up.  Allergies: No Known Allergies  Vitals: Temp: 98.2 F (36.8 C) (03/13 1528) Temp Source: Oral (03/13 1528) BP: 144/91 (03/13 1528) Pulse Rate: 74 (03/13 1528)  Past Medical History: No past medical history on file.  Social History: Social History   Socioeconomic History  . Marital status: Married    Spouse name: [name]  . Number of children: 1  . Years of education: some college   . Highest education level: None  Social Needs  . Financial resource strain: None  . Food insecurity - worry: None  . Food insecurity - inability: None  . Transportation needs - medical: None  . Transportation needs - non-medical: None  Occupational History  . Occupation: Press photographer    Comment: Previously Sports administrator   Tobacco Use  . Smoking status: Former Research scientist (life sciences)  . Smokeless tobacco: Never Used  . Tobacco comment: Smoked in 87s  Substance and Sexual Activity  . Alcohol use: Yes    Comment: occasionally   . Drug use: Yes    Types: Opium  . Sexual activity: Not Currently    Partners: Female    Comment: not for last 3 weeks, no condom use  Other Topics Concern  . None  Social History Narrative  . None    Previous Regimen: None  Current Regimen: Biktarvy  Labs: HIV 1 RNA Quant (copies/mL)  Date Value  09/15/2017 <20 NOT DETECTED  12/29/2016 <20 NOT DETECTED  11/11/2016 15,400 (H)   CD4 T Cell Abs (/uL)  Date Value  09/15/2017 560  12/29/2016 450  11/11/2016 290 (L)   Hep B S Ab (no units)  Date Value  11/11/2016 NEG   Hepatitis B Surface Ag (no units)  Date Value  11/11/2016 NEGATIVE   HCV Ab (no units)  Date Value  11/11/2016 NEGATIVE    CrCl: Estimated Creatinine Clearance: 109.1 mL/min (by C-G formula based on SCr of 0.98 mg/dL).  Lipids:    Component Value Date/Time   CHOL 253 (H) 09/15/2017 1522   TRIG 387 (H) 09/15/2017 1522   HDL 48  09/15/2017 1522   CHOLHDL 5.3 (H) 09/15/2017 1522   VLDL 62 (H) 11/11/2016 1140   LDLCALC 145 (H) 09/15/2017 1522    Assessment: Zavien is doing very well on his antiretrovirals. He ask Dr. Megan Salon if it is ok to take a new weight loss supplement call Forskolin at [facility]. We have no idea what this supplement is and had to look it up on the herbals interaction database. This compound has the potential to activate the 3A4 enzyme which responsible for metabolizing bictegravir, therefore, theoretically it could decrease the level of bictegravir. Told him to avoid it completely.   Recommendations:  Continue Biktarvy 1 daily Avoid Forskolin supplement  Onnie Boer, PharmD, BCPS, AAHIVP, CPP Clinical Infectious [place] for Infectious Disease 09/29/2017, 4:35 PM

## 2017-09-29 NOTE — Progress Notes (Signed)
Patient Active Problem List   Diagnosis Date Noted  . HIV disease ([place]) 11/11/2016    Priority: High  . Low back pain 09/29/2017  . Hypertension 04/01/2017  . Renal insufficiency 03/31/2017  . Dyslipidemia 03/31/2017    Patient's Medications  New Prescriptions   No medications on file  Previous Medications   BIKTARVY 50-200-25 MG TABS TABLET    TAKE 1 TABLET BY MOUTH ONCE DAILY   MULTIPLE VITAMIN (MULTIVITAMIN) TABLET    Take 1 tablet by mouth daily.  Modified Medications   No medications on file  Discontinued Medications   No medications on file    Subjective: Joshua Zhang is in for his routine HIV follow-up visit.  He has had no problems obtaining, taking or tolerating his Biktarvy.  He recalls missing only one dose since his last visit.  This occurred when he left home for work before taking his medication.  He believes that he took his medication when he got home from work.  He has a small pill canister but he has never filled it up with medication.  He would like to lose 5-10 pounds.  He wants to know if it is okay to take weight loss supplements from [facility].  He recently sustained a mild left lower back strain while exercising.  He has been applying ice but it is still bothering him some.  His wife, [name], remains on Truvada PrEP but he tells me that she wants to know if she can safely come off now.  Review of Systems: Review of Systems  Constitutional: Negative for chills, diaphoresis, fever, malaise/fatigue and weight loss.  HENT: Negative for congestion and sore throat.   Respiratory: Negative for cough, sputum production and shortness of breath.   Cardiovascular: Negative for chest pain.  Gastrointestinal: Negative for abdominal pain, diarrhea, heartburn, nausea and vomiting.  Genitourinary: Negative for dysuria and frequency.  Musculoskeletal: Positive for back pain. Negative for joint pain and myalgias.  Skin: Negative for rash.  Neurological: Negative for  dizziness and headaches.  Psychiatric/Behavioral: Negative for depression and substance abuse. The patient is not nervous/anxious.     No past medical history on file.  Social History   Tobacco Use  . Smoking status: Former Research scientist (life sciences)  . Smokeless tobacco: Never Used  . Tobacco comment: Smoked in 59s  Substance Use Topics  . Alcohol use: Yes    Comment: occasionally   . Drug use: Yes    Types: Opium    Family History  Problem Relation Age of Onset  . Aortic aneurysm Father     No Known Allergies  Health Maintenance  Topic Date Due  . TETANUS/TDAP  01/21/1988  . INFLUENZA VACCINE  02/17/2017  . HIV Screening  Completed    Objective:  Vitals:   09/29/17 1528  BP: (!) 144/91  Pulse: 74  Temp: 98.2 F (36.8 C)  TempSrc: Oral  Weight: 212 lb 4 oz (96.3 kg)  Height: 5\' 11"  (1.803 m)   Body mass index is 29.6 kg/m.  Physical Exam  Constitutional: He is oriented to person, place, and time.  He is in good spirits.  HENT:  Mouth/Throat: No oropharyngeal exudate.  Eyes: Conjunctivae are normal.  Cardiovascular: Normal rate and regular rhythm.  No murmur heard. Pulmonary/Chest: Effort normal and breath sounds normal. He has no wheezes. He has no rales.  Abdominal: Soft. He exhibits no mass. There is no tenderness.  Musculoskeletal: Normal range of motion.  Neurological: He  is alert and oriented to person, place, and time. Gait normal.  Skin: No rash noted.  Psychiatric: Mood and affect normal.    Lab Results Lab Results  Component Value Date   WBC 6.6 09/15/2017   HGB 16.1 09/15/2017   HCT 44.7 09/15/2017   MCV 93.5 09/15/2017   PLT 225 09/15/2017    Lab Results  Component Value Date   CREATININE 0.98 09/15/2017   BUN 20 09/15/2017   NA 140 09/15/2017   K 3.8 09/15/2017   CL 104 09/15/2017   CO2 27 09/15/2017    Lab Results  Component Value Date   ALT 31 09/15/2017   AST 27 09/15/2017   ALKPHOS 82 11/11/2016   BILITOT 0.7 09/15/2017    Lab  Results  Component Value Date   CHOL 253 (H) 09/15/2017   HDL 48 09/15/2017   LDLCALC 145 (H) 09/15/2017   TRIG 387 (H) 09/15/2017   CHOLHDL 5.3 (H) 09/15/2017   Lab Results  Component Value Date   LABRPR NON-REACTIVE 09/15/2017   HIV 1 RNA Quant (copies/mL)  Date Value  09/15/2017 <20 NOT DETECTED  12/29/2016 <20 NOT DETECTED  11/11/2016 15,400 (H)   CD4 T Cell Abs (/uL)  Date Value  09/15/2017 560  12/29/2016 450  11/11/2016 290 (L)     Problem List Items Addressed This Visit      High   HIV disease ([place])    His infection is now under excellent control with Biktarvy.  His CD4 count is normal.  His adherence is very good.  I encouraged him to fill out his pill canister so that he will have a supply that he can use if he forgets to take it at home.  I talked to him about the risk-benefit decision of having [name] stop Truvada.  As long as his viral load is undetectable his risk of transmitting HIV remains very low.  The decision to stop will need to be up to them.  I had him meet with Onnie Boer our ID pharmacist.  We recommend against weight loss supplements because of the risk of drug drug interactions with Biktarvy.      Relevant Orders   CBC   T-helper cell (CD4)- ([facility] clinic only)   Comprehensive metabolic panel   Lipid panel   RPR   HIV 1 RNA quant-no reflex-bld     Unprioritized   Low back pain    I recommended gentle stretching, continued exercise and nonsteroidal anti-inflammatories as needed.      Renal insufficiency    His creatinine is back to normal.           Michel Bickers, MD [facility] for [facility] [phone] pager   [phone] cell 09/29/2017, 4:22 PM

## 2017-09-29 NOTE — Assessment & Plan Note (Signed)
I recommended gentle stretching, continued exercise and nonsteroidal anti-inflammatories as needed.

## 2017-10-04 MED FILL — BIKTARVY 50-200-25 MG TABS: 50-200-25 | 30 days supply | Qty: 30 | Fill #4

## 2017-11-02 MED FILL — BIKTARVY 50-200-25 MG TABS: 50-200-25 | 30 days supply | Qty: 30 | Fill #5

## 2017-11-29 MED FILL — BIKTARVY 50-200-25 MG TABS: 50-200-25 | 30 days supply | Qty: 30 | Fill #6

## 2017-12-31 ENCOUNTER — Other Ambulatory Visit: Payer: Self-pay | Admitting: Internal Medicine

## 2018-01-03 DIAGNOSIS — H1033 Unspecified acute conjunctivitis, bilateral: Secondary | ICD-10-CM | POA: Diagnosis not present

## 2018-01-03 MED FILL — BIKTARVY 50-200-25 MG TABS: 50-200-25 | 30 days supply | Qty: 30 | Fill #0

## 2018-02-02 MED FILL — BIKTARVY 50-200-25 MG TABS: 50-200-25 | 30 days supply | Qty: 30 | Fill #1

## 2018-03-02 MED FILL — BIKTARVY 50-200-25 MG TABS: 50-200-25 | 30 days supply | Qty: 30 | Fill #2

## 2018-04-04 MED FILL — BIKTARVY 50-200-25 MG TABS: 50-200-25 | 30 days supply | Qty: 30 | Fill #3

## 2018-04-27 ENCOUNTER — Telehealth: Payer: Self-pay | Admitting: Pharmacist

## 2018-04-27 NOTE — Telephone Encounter (Signed)
Patient called with some questions.  He is having some diarrhea every now and then with some blood in his stool. He states it has only happened a few times and it is not every day.  I told him that Biktarvy should not cause blood in his stool.  I told him that it could be due to hemorrhoids. Advised him to call his PCP to discuss if it continues.  He has also started a new diet medication and wants to make sure it does not interact.  He will send me a picture of what he is taking so I can do a drug interaction check.  He is doing well otherwise with no other side effects or missed doses.

## 2018-04-28 NOTE — Telephone Encounter (Signed)
There was a moderate interaction between the ingredients in his supplement and his Biktarvy.  I told him to take his Biktarvy in the morning and take the supplement at least 6 hours later, preferable at bedtime. He agreed.

## 2018-05-04 MED FILL — BIKTARVY 50-200-25 MG TABS: 50-200-25 | 30 days supply | Qty: 30 | Fill #4

## 2018-06-02 MED FILL — BIKTARVY 50-200-25 MG TABS: 50-200-25 | 30 days supply | Qty: 30 | Fill #5

## 2018-07-01 DIAGNOSIS — M545 Low back pain: Secondary | ICD-10-CM | POA: Diagnosis not present

## 2018-07-04 MED FILL — BIKTARVY 50-200-25 MG TABS: 50-200-25 | 30 days supply | Qty: 30 | Fill #6

## 2018-07-26 DIAGNOSIS — I10 Essential (primary) hypertension: Secondary | ICD-10-CM | POA: Diagnosis not present

## 2018-07-26 DIAGNOSIS — Z23 Encounter for immunization: Secondary | ICD-10-CM | POA: Diagnosis not present

## 2018-07-26 DIAGNOSIS — Z0000 Encounter for general adult medical examination without abnormal findings: Secondary | ICD-10-CM | POA: Diagnosis not present

## 2018-07-26 DIAGNOSIS — E782 Mixed hyperlipidemia: Secondary | ICD-10-CM | POA: Insufficient documentation

## 2018-08-04 MED FILL — BIKTARVY 50-200-25 MG TABS: 50-200-25 | 30 days supply | Qty: 30 | Fill #7

## 2018-09-01 MED FILL — BIKTARVY 50-200-25 MG TABS: 50-200-25 | 30 days supply | Qty: 30 | Fill #8

## 2018-09-15 ENCOUNTER — Other Ambulatory Visit: Payer: BLUE CROSS/BLUE SHIELD

## 2018-09-15 DIAGNOSIS — B20 Human immunodeficiency virus [HIV] disease: Secondary | ICD-10-CM | POA: Diagnosis not present

## 2018-09-15 DIAGNOSIS — M25521 Pain in right elbow: Secondary | ICD-10-CM | POA: Diagnosis not present

## 2018-09-15 DIAGNOSIS — M7021 Olecranon bursitis, right elbow: Secondary | ICD-10-CM | POA: Diagnosis not present

## 2018-09-16 LAB — T-HELPER CELL (CD4) - (RCID CLINIC ONLY)
CD4 T CELL HELPER: 22 % — AB (ref 33–55)
CD4 T Cell Abs: 350 /uL — ABNORMAL LOW (ref 400–2700)

## 2018-09-18 DIAGNOSIS — L03113 Cellulitis of right upper limb: Secondary | ICD-10-CM | POA: Diagnosis not present

## 2018-09-18 DIAGNOSIS — I10 Essential (primary) hypertension: Secondary | ICD-10-CM | POA: Diagnosis not present

## 2018-09-19 LAB — COMPREHENSIVE METABOLIC PANEL
AG Ratio: 1.5 (calc) (ref 1.0–2.5)
ALBUMIN MSPROF: 4.1 g/dL (ref 3.6–5.1)
ALKALINE PHOSPHATASE (APISO): 81 U/L (ref 36–130)
ALT: 23 U/L (ref 9–46)
AST: 18 U/L (ref 10–40)
BILIRUBIN TOTAL: 1.7 mg/dL — AB (ref 0.2–1.2)
BUN: 12 mg/dL (ref 7–25)
CALCIUM: 9.5 mg/dL (ref 8.6–10.3)
CO2: 26 mmol/L (ref 20–32)
Chloride: 104 mmol/L (ref 98–110)
Creat: 1.16 mg/dL (ref 0.60–1.35)
Globulin: 2.8 g/dL (calc) (ref 1.9–3.7)
Glucose, Bld: 113 mg/dL — ABNORMAL HIGH (ref 65–99)
Potassium: 3.6 mmol/L (ref 3.5–5.3)
Sodium: 139 mmol/L (ref 135–146)
TOTAL PROTEIN: 6.9 g/dL (ref 6.1–8.1)

## 2018-09-19 LAB — CBC
HEMATOCRIT: 43.2 % (ref 38.5–50.0)
HEMOGLOBIN: 15.5 g/dL (ref 13.2–17.1)
MCH: 34 pg — ABNORMAL HIGH (ref 27.0–33.0)
MCHC: 35.9 g/dL (ref 32.0–36.0)
MCV: 94.7 fL (ref 80.0–100.0)
MPV: 10.5 fL (ref 7.5–12.5)
Platelets: 238 10*3/uL (ref 140–400)
RBC: 4.56 10*6/uL (ref 4.20–5.80)
RDW: 11.8 % (ref 11.0–15.0)
WBC: 9.4 10*3/uL (ref 3.8–10.8)

## 2018-09-19 LAB — RPR: RPR: NONREACTIVE

## 2018-09-19 LAB — HIV-1 RNA QUANT-NO REFLEX-BLD
HIV 1 RNA Quant: 21 copies/mL — ABNORMAL HIGH
HIV-1 RNA Quant, Log: 1.32 Log copies/mL — ABNORMAL HIGH

## 2018-09-19 LAB — LIPID PANEL
CHOL/HDL RATIO: 3.3 (calc) (ref <5.0)
CHOLESTEROL: 177 mg/dL (ref <200)
HDL: 54 mg/dL (ref >=40)
LDL Cholesterol (Calc): 104 mg/dL (calc) — ABNORMAL HIGH
NON-HDL CHOLESTEROL (CALC): 123 mg/dL (ref <130)
Triglycerides: 94 mg/dL (ref <150)

## 2018-09-23 ENCOUNTER — Other Ambulatory Visit: Payer: Self-pay | Admitting: Infectious Diseases

## 2018-09-29 ENCOUNTER — Ambulatory Visit (INDEPENDENT_AMBULATORY_CARE_PROVIDER_SITE_OTHER): Payer: BLUE CROSS/BLUE SHIELD | Admitting: Internal Medicine

## 2018-09-29 ENCOUNTER — Encounter: Payer: Self-pay | Admitting: Internal Medicine

## 2018-09-29 ENCOUNTER — Other Ambulatory Visit: Payer: Self-pay

## 2018-09-29 DIAGNOSIS — B20 Human immunodeficiency virus [HIV] disease: Secondary | ICD-10-CM | POA: Diagnosis not present

## 2018-09-29 NOTE — Assessment & Plan Note (Signed)
His infection remains under very good, long-term control.  He will continue Biktarvy and follow-up after lab work in 1 year.

## 2018-09-29 NOTE — Progress Notes (Signed)
Patient Active Problem List   Diagnosis Date Noted  . HIV disease ([place]) 11/11/2016    Priority: High  . Low back pain 09/29/2017  . Hypertension 04/01/2017  . Renal insufficiency 03/31/2017  . Dyslipidemia 03/31/2017    Patient's Medications  New Prescriptions   No medications on file  Previous Medications   AMLODIPINE (NORVASC) 5 MG TABLET    Take by mouth.   ATORVASTATIN (LIPITOR) 10 MG TABLET    Take by mouth.   BIKTARVY 50-200-25 MG TABS TABLET    TAKE 1 TABLET BY MOUTH ONCE DAILY   MULTIPLE VITAMIN (MULTIVITAMIN) TABLET    Take 1 tablet by mouth daily.  Modified Medications   No medications on file  Discontinued Medications   No medications on file    Subjective: Joshua Zhang is in for his routine HIV follow-up visit.  He has had no problems obtaining, taking or tolerating his Biktarvy.  He takes it each morning and never misses a single dose.  He recently saw his primary care provider and was told that his blood pressure and cholesterol were elevated.  He was started on 2 new medications.  He knows that one is amlodipine.  He has had some intermittent bright red blood in his stool recently.  He will be scheduled for a colonoscopy when he turns 21 in July.  His wife is no longer taking Truvada PrEP.  Review of Systems: Review of Systems  Constitutional: Negative for chills, diaphoresis, fever, malaise/fatigue and weight loss.  HENT: Negative for sore throat.   Respiratory: Negative for cough, sputum production and shortness of breath.   Cardiovascular: Negative for chest pain.  Gastrointestinal: Positive for blood in stool. Negative for abdominal pain, diarrhea, heartburn, nausea and vomiting.  Genitourinary: Negative for dysuria and frequency.  Musculoskeletal: Negative for joint pain and myalgias.  Skin: Negative for rash.  Neurological: Negative for dizziness and headaches.  Psychiatric/Behavioral: Negative for depression and substance abuse. The patient is not  nervous/anxious.     No past medical history on file.  Social History   Tobacco Use  . Smoking status: Former Research scientist (life sciences)  . Smokeless tobacco: Never Used  . Tobacco comment: Smoked in 73s  Substance Use Topics  . Alcohol use: Yes    Comment: occasionally   . Drug use: Yes    Types: Opium    Family History  Problem Relation Age of Onset  . Aortic aneurysm Father     No Known Allergies  Health Maintenance  Topic Date Due  . TETANUS/TDAP  01/21/1988  . INFLUENZA VACCINE  02/17/2018  . HIV Screening  Completed    Objective:  Vitals:   09/29/18 1020  BP: (!) 129/93  Pulse: 98  Weight: 198 lb (89.8 kg)   Body mass index is 27.62 kg/m.  Physical Exam Constitutional:      Comments: He is in good spirits.  HENT:     Mouth/Throat:     Pharynx: No oropharyngeal exudate.  Eyes:     Conjunctiva/sclera: Conjunctivae normal.  Cardiovascular:     Rate and Rhythm: Normal rate and regular rhythm.     Heart sounds: No murmur.  Pulmonary:     Effort: Pulmonary effort is normal.     Breath sounds: Normal breath sounds.  Abdominal:     Palpations: Abdomen is soft. There is no mass.     Tenderness: There is no abdominal tenderness.  Musculoskeletal: Normal range of motion.  Skin:  Findings: No rash.  Neurological:     Mental Status: He is alert and oriented to person, place, and time.  Psychiatric:        Mood and Affect: Mood normal.     Lab Results Lab Results  Component Value Date   WBC 9.4 09/15/2018   HGB 15.5 09/15/2018   HCT 43.2 09/15/2018   MCV 94.7 09/15/2018   PLT 238 09/15/2018    Lab Results  Component Value Date   CREATININE 1.16 09/15/2018   BUN 12 09/15/2018   NA 139 09/15/2018   K 3.6 09/15/2018   CL 104 09/15/2018   CO2 26 09/15/2018    Lab Results  Component Value Date   ALT 23 09/15/2018   AST 18 09/15/2018   ALKPHOS 82 11/11/2016   BILITOT 1.7 (H) 09/15/2018    Lab Results  Component Value Date   CHOL 177 09/15/2018   HDL  54 09/15/2018   LDLCALC 104 (H) 09/15/2018   TRIG 94 09/15/2018   CHOLHDL 3.3 09/15/2018   Lab Results  Component Value Date   LABRPR NON-REACTIVE 09/15/2018   HIV 1 RNA Quant (copies/mL)  Date Value  09/15/2018 21 (H)  09/15/2017 <20 NOT DETECTED  12/29/2016 <20 NOT DETECTED   CD4 T Cell Abs (/uL)  Date Value  09/15/2018 350 (L)  09/15/2017 560  12/29/2016 450     Problem List Items Addressed This Visit      High   HIV disease ([place])    His infection remains under very good, long-term control.  He will continue Biktarvy and follow-up after lab work in 1 year.           Michel Bickers, MD [facility] for Infectious [place] [facility] [phone] pager   [phone] cell 09/29/2018, 10:24 AM

## 2018-10-03 MED FILL — BIKTARVY 50-200-25 MG TABS: 50-200-25 | 30 days supply | Qty: 30 | Fill #0

## 2018-10-25 DIAGNOSIS — E782 Mixed hyperlipidemia: Secondary | ICD-10-CM | POA: Diagnosis not present

## 2018-10-25 DIAGNOSIS — I10 Essential (primary) hypertension: Secondary | ICD-10-CM | POA: Diagnosis not present

## 2018-10-31 DIAGNOSIS — I10 Essential (primary) hypertension: Secondary | ICD-10-CM | POA: Diagnosis not present

## 2018-10-31 DIAGNOSIS — E782 Mixed hyperlipidemia: Secondary | ICD-10-CM | POA: Diagnosis not present

## 2018-10-31 MED FILL — BIKTARVY 50-200-25 MG TABS: 50-200-25 | 30 days supply | Qty: 30 | Fill #1

## 2018-11-30 ENCOUNTER — Telehealth: Payer: Self-pay | Admitting: Pharmacy Technician

## 2018-11-30 NOTE — Telephone Encounter (Signed)
[facility] Patient Advocate Encounter   Received notification from [employer] that prior authorization for Joshua Zhang is required.   PA submitted on 11/30/2018 by fax. Status is pending    [facility] will continue to follow.   Joshua Zhang. Joshua Zhang Joshua Zhang Patient [name] for Infectious Disease Phone: [phone] Fax:  [phone]

## 2018-12-02 MED FILL — BIKTARVY 50-200-25 MG TABS: 50-200-25 | 30 days supply | Qty: 30 | Fill #2

## 2018-12-05 NOTE — Telephone Encounter (Signed)
[facility] Patient Advocate Encounter  Prior Authorization for Joshua Zhang has been approved.    PA# [id number] Effective dates: 12/01/2018 through 11/30/2019  Patients co-pay is $200 which is covered by a [place] copay card to make it $0.    Joshua Zhang [place] Patient [facility] for Infectious Disease Phone: [phone] Fax:  [phone]

## 2018-12-29 MED FILL — BIKTARVY 50-200-25 MG TABS: 50-200-25 | 30 days supply | Qty: 30 | Fill #3

## 2019-01-26 MED FILL — BIKTARVY 50-200-25 MG TABS: 50-200-25 | 30 days supply | Qty: 30 | Fill #4

## 2019-03-01 MED FILL — BIKTARVY 50-200-25 MG TABS: 50-200-25 | 30 days supply | Qty: 30 | Fill #5

## 2019-03-03 DIAGNOSIS — S46391A Other injury of muscle, fascia and tendon of triceps, right arm, initial encounter: Secondary | ICD-10-CM | POA: Diagnosis not present

## 2019-03-03 DIAGNOSIS — S46309A Unspecified injury of muscle, fascia and tendon of triceps, unspecified arm, initial encounter: Secondary | ICD-10-CM | POA: Insufficient documentation

## 2019-03-17 DIAGNOSIS — M25521 Pain in right elbow: Secondary | ICD-10-CM | POA: Diagnosis not present

## 2019-03-24 DIAGNOSIS — S46391D Other injury of muscle, fascia and tendon of triceps, right arm, subsequent encounter: Secondary | ICD-10-CM | POA: Diagnosis not present

## 2019-03-28 MED FILL — BIKTARVY 50-200-25 MG TABS: 50-200-25 | 30 days supply | Qty: 30 | Fill #6

## 2019-04-14 DIAGNOSIS — S46391D Other injury of muscle, fascia and tendon of triceps, right arm, subsequent encounter: Secondary | ICD-10-CM | POA: Diagnosis not present

## 2019-04-25 MED FILL — BIKTARVY 50-200-25 MG TABS: 50-200-25 | 30 days supply | Qty: 30 | Fill #7

## 2019-05-01 DIAGNOSIS — I10 Essential (primary) hypertension: Secondary | ICD-10-CM | POA: Diagnosis not present

## 2019-05-01 DIAGNOSIS — Z23 Encounter for immunization: Secondary | ICD-10-CM | POA: Diagnosis not present

## 2019-05-01 DIAGNOSIS — E782 Mixed hyperlipidemia: Secondary | ICD-10-CM | POA: Diagnosis not present

## 2019-05-01 DIAGNOSIS — Z1211 Encounter for screening for malignant neoplasm of colon: Secondary | ICD-10-CM | POA: Diagnosis not present

## 2019-05-23 MED FILL — BIKTARVY 50-200-25 MG TABS: 50-200-25 | 30 days supply | Qty: 30 | Fill #8

## 2019-06-20 DIAGNOSIS — B20 Human immunodeficiency virus [HIV] disease: Secondary | ICD-10-CM | POA: Diagnosis not present

## 2019-06-20 DIAGNOSIS — E669 Obesity, unspecified: Secondary | ICD-10-CM | POA: Diagnosis not present

## 2019-06-20 DIAGNOSIS — Z1211 Encounter for screening for malignant neoplasm of colon: Secondary | ICD-10-CM | POA: Diagnosis not present

## 2019-06-20 DIAGNOSIS — K625 Hemorrhage of anus and rectum: Secondary | ICD-10-CM | POA: Diagnosis not present

## 2019-06-22 ENCOUNTER — Other Ambulatory Visit: Payer: Self-pay | Admitting: Infectious Diseases

## 2019-06-24 ENCOUNTER — Other Ambulatory Visit: Payer: Self-pay

## 2019-06-24 DIAGNOSIS — Z20822 Contact with and (suspected) exposure to covid-19: Secondary | ICD-10-CM

## 2019-06-26 MED FILL — BIKTARVY 50-200-25 MG TABS: 50-200-25 | 30 days supply | Qty: 30 | Fill #0

## 2019-06-27 LAB — NOVEL CORONAVIRUS, NAA: SARS-CoV-2, NAA: NOT DETECTED

## 2019-07-03 DIAGNOSIS — D123 Benign neoplasm of transverse colon: Secondary | ICD-10-CM | POA: Diagnosis not present

## 2019-07-03 DIAGNOSIS — K625 Hemorrhage of anus and rectum: Secondary | ICD-10-CM | POA: Diagnosis not present

## 2019-07-03 DIAGNOSIS — K635 Polyp of colon: Secondary | ICD-10-CM | POA: Diagnosis not present

## 2019-07-03 DIAGNOSIS — D122 Benign neoplasm of ascending colon: Secondary | ICD-10-CM | POA: Diagnosis not present

## 2019-07-03 DIAGNOSIS — D124 Benign neoplasm of descending colon: Secondary | ICD-10-CM | POA: Diagnosis not present

## 2019-07-03 DIAGNOSIS — Z1211 Encounter for screening for malignant neoplasm of colon: Secondary | ICD-10-CM | POA: Diagnosis not present

## 2019-07-03 DIAGNOSIS — D125 Benign neoplasm of sigmoid colon: Secondary | ICD-10-CM | POA: Diagnosis not present

## 2019-07-03 DIAGNOSIS — C20 Malignant neoplasm of rectum: Secondary | ICD-10-CM | POA: Diagnosis not present

## 2019-07-04 ENCOUNTER — Other Ambulatory Visit: Payer: Self-pay | Admitting: Gastroenterology

## 2019-07-04 DIAGNOSIS — R7301 Impaired fasting glucose: Secondary | ICD-10-CM | POA: Diagnosis not present

## 2019-07-04 DIAGNOSIS — I10 Essential (primary) hypertension: Secondary | ICD-10-CM | POA: Diagnosis not present

## 2019-07-04 DIAGNOSIS — K6389 Other specified diseases of intestine: Secondary | ICD-10-CM

## 2019-07-04 DIAGNOSIS — E782 Mixed hyperlipidemia: Secondary | ICD-10-CM | POA: Diagnosis not present

## 2019-07-04 DIAGNOSIS — C20 Malignant neoplasm of rectum: Secondary | ICD-10-CM | POA: Diagnosis not present

## 2019-07-05 ENCOUNTER — Other Ambulatory Visit: Payer: Self-pay | Admitting: Gastroenterology

## 2019-07-05 DIAGNOSIS — K6389 Other specified diseases of intestine: Secondary | ICD-10-CM

## 2019-07-06 ENCOUNTER — Other Ambulatory Visit: Payer: Self-pay | Admitting: Gastroenterology

## 2019-07-06 DIAGNOSIS — C20 Malignant neoplasm of rectum: Secondary | ICD-10-CM

## 2019-07-12 ENCOUNTER — Other Ambulatory Visit: Payer: BLUE CROSS/BLUE SHIELD

## 2019-07-12 ENCOUNTER — Other Ambulatory Visit: Payer: Self-pay

## 2019-07-12 ENCOUNTER — Ambulatory Visit
Admission: RE | Admit: 2019-07-12 | Discharge: 2019-07-12 | Disposition: A | Discharge status: Home / Self Care | Payer: BC Managed Care – PPO | Source: Ambulatory Visit | Attending: Gastroenterology | Admitting: Gastroenterology

## 2019-07-12 DIAGNOSIS — K6389 Other specified diseases of intestine: Secondary | ICD-10-CM

## 2019-07-12 DIAGNOSIS — J8410 Pulmonary fibrosis, unspecified: Secondary | ICD-10-CM | POA: Diagnosis not present

## 2019-07-12 MED ORDER — IOPAMIDOL (ISOVUE-300) INJECTION 61%
100.0000 mL | Freq: Once | INTRAVENOUS | Status: AC | PRN
  Administered 2019-07-12: 100 mL via INTRAVENOUS

## 2019-07-26 MED FILL — BIKTARVY 50-200-25 MG TABS: 50-200-25 | 30 days supply | Qty: 30 | Fill #1

## 2019-08-02 ENCOUNTER — Other Ambulatory Visit: Payer: Self-pay

## 2019-08-02 ENCOUNTER — Ambulatory Visit
Admission: RE | Admit: 2019-08-02 | Discharge: 2019-08-02 | Disposition: A | Discharge status: Home / Self Care | Payer: BC Managed Care – PPO | Source: Ambulatory Visit | Attending: Gastroenterology | Admitting: Gastroenterology

## 2019-08-02 DIAGNOSIS — C20 Malignant neoplasm of rectum: Secondary | ICD-10-CM

## 2019-08-15 DIAGNOSIS — C20 Malignant neoplasm of rectum: Secondary | ICD-10-CM | POA: Diagnosis not present

## 2019-08-16 ENCOUNTER — Telehealth: Payer: Self-pay

## 2019-08-16 NOTE — Telephone Encounter (Signed)
Patient called office today to inform MD he was recently diagnosed with cancer. Patient would like to know if there would be any contraindications between [facility] and radiation treatment. Patient is not sure when he will begin radiation treatment.   [place]

## 2019-08-16 NOTE — Telephone Encounter (Signed)
Not that I am aware of. If he starts chemo, he will need to check back with [place] for drug-drug interactions.

## 2019-08-17 ENCOUNTER — Telehealth: Payer: Self-pay | Admitting: Hematology

## 2019-08-17 NOTE — Progress Notes (Signed)
[place]   [phone] [phone]   Clinic New Consult Note   Patient Care Team: Merwyn Katos as PCP - General (Physician Assistant)  Date of Service:  08/21/2019   CHIEF COMPLAINTS/PURPOSE OF CONSULTATION:  Newly Diagnosed Rectal Cancer   REFERRING PHYSICIAN:  Dr. Marcello Moores  Oncology History Overview Note  Cancer Staging Rectal adenocarcinoma [facility]) Staging form: Colon and Rectum, AJCC 8th Edition - Clinical stage from 08/21/2019: Stage IIIB (cT3, cN1, cM0) - Signed by Truitt Merle, MD on 08/21/2019    Rectal adenocarcinoma ([place])  07/03/2019 Procedure   Colonoscopy by Dr. Collene Mares  IMPRESSION -Likely Malignant 5cm, circumferential, bleeding tumor in the rectum, 304 cm from the anal verge -biopsy done  -One 8 mm sessile polyp in the distal sigmoid colon, removed with a hot snare x2; resected and retrieved.  -Two small sessile polyps, 1 in the mid-descending colon and 1 in the mid transverse colon - removed by cold biopsies.  -One 31m sessile polyp in the mid transverse colon, removed with a hot snare x1; resected and retrieved.  -One 83msessile polyp in the proximal ascending colon, removed with a hot snare X1; resected and retrieved.  -few large scattered diverticula.   07/03/2019 Initial Biopsy   FINAL DIAGNOSIS 07/03/19  A. Colon, Descending, Polyp, Polypectomy:   -TUBULAR ADENOMA   -No high grade dysplasia or malignancy.  B. Colon, transverse, polyp, polypectomy:   -TUBULAR ADENOMA  -No high grade dysplasia or malignancy. C. Colon, Ascending, Polyp, Polypectomy:   -Fragments of sessile serrated adenoma/polyp D. Colon, sigmoid, polyp, polypectomy:   -Fragments of Tubular Adenoma  -No high grade dysplasia or malignancy. E. Rectum, Mass, Biopsy:   -INVASIVE COLONIC ADENOCARCINOMA, MODERATELY-DIFFERENTIATED, see comment     07/12/2019 Imaging   CT AP W Contrast 07/12/19  IMPRESSION: 1. No evidence of metastatic disease within  the chest, abdomen or pelvis. 2. Possible mild wall thickening in the sigmoid colon, although no focal colonic mass is identified. There are few distal colonic diverticula. 3. Sequela of prior granulomatous disease. 4. Possible cholelithiasis with mild wall thickening of the gallbladder fundus. 5. Aortic Atherosclerosis (ICD10-I70.0).   08/21/2019 Initial Diagnosis   [place]  08/21/2019 Cancer Staging   Staging form: Colon and Rectum, AJCC 8th Edition - Clinical stage from 08/21/2019: Stage IIIB (cT3, cN1, cM0) - Signed by FeTruitt MerleMD on 08/21/2019      HISTORY OF PRESENTING ILLNESS:  Joshua Doom49o. male is a here because of newly diagnosed rectal cancer. The patient was referred by Dr. ThMarcello MooresThe patient presents to the clinic today accompanied by hie wife [name].   He has had intermittent rectal bleeding over the past 2 years. This has not worsened. He notes ongoing loose stool once a day lately. He denies N&V and is able to eat adequately. He notes stable and adequate energy level. He denies any other recent changes.  He went for his first screening colonoscopy with Dr. MaCollene Maresnd a mass was found. He had several polyps removed and exam showed diverticulosis. One of his polyps was found to be cancerous.    Socially he is married. He does not have biological children but 1 stepdaughter. 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 He has h/o smoking intermittently for 2 years in the past. He drinks wine or beer moderately in 1 week with 12-15 drinks totally a week. He feels he has no issues to stop. He denies recreational or IV drug  use.   They have a PMHx of HTN, on medication. His BP is normal when calm. He notes he has HIV and is on treatment with Biktavy. This is managed by Dr. Megan Salon. He notes he had left shoulder in 1993 due to injury and appendectomy in the past. His mother had breast cancer diagnosed at 48.    REVIEW OF SYSTEMS:    Constitutional: Denies  fevers, chills or abnormal night sweats Eyes: Denies blurriness of vision, double vision or watery eyes Ears, nose, mouth, throat, and face: Denies mucositis or sore throat Respiratory: Denies cough, dyspnea or wheezes Cardiovascular: Denies palpitation, chest discomfort or lower extremity swelling Gastrointestinal:  Denies nausea, heartburn (+) intermittent rectal bleeding (+) Loose stool  Skin: Denies abnormal skin rashes Lymphatics: Denies new lymphadenopathy or easy bruising Neurological:Denies numbness, tingling or new weaknesses Behavioral/Psych: Mood is stable, no new changes  All other systems were reviewed with the patient and are negative.   MEDICAL HISTORY:  Past Medical History:  Diagnosis Date  . Hypertension     SURGICAL HISTORY: Past Surgical History:  Procedure Laterality Date  . APPENDECTOMY     49 years old   . SHOULDER SURGERY Bilateral 49 years old    SOCIAL HISTORY: Social History   Socioeconomic History  . Marital status: Married    Spouse name: [name]  . Number of children: 1  . Years of education: some college   . Highest education level: Not on file  Occupational History  . Occupation: Press photographer    Comment: Previously Sports administrator   Tobacco Use  . Smoking status: Never Smoker  . Smokeless tobacco: Never Used  . Tobacco comment: Smoked in 49s  Substance and Sexual Activity  . Alcohol use: Yes    Alcohol/week: 24.0 standard drinks    Types: 12 Glasses of wine, 12 Cans of beer per week  . Drug use: Yes    Types: Opium  . Sexual activity: Not Currently    Partners: Female    Comment: not for last 3 weeks, no condom use  Other Topics Concern  . Not on file  Social History Narrative  . Not on file   Social Determinants of Health   Financial Resource Strain:   . Difficulty of Paying Living Expenses: Not on file  Food Insecurity:   . Worried About Charity fundraiser in the Last Year: Not on file  . Ran Out of Food in the Last  Year: Not on file  Transportation Needs:   . Lack of Transportation (Medical): Not on file  . Lack of Transportation (Non-Medical): Not on file  Physical Activity:   . Days of Exercise per Week: Not on file  . Minutes of Exercise per Session: Not on file  Stress:   . Feeling of Stress : Not on file  Social Connections:   . Frequency of Communication with Friends and Family: Not on file  . Frequency of Social Gatherings with Friends and Family: Not on file  . Attends Religious Services: Not on file  . Active Member of Clubs or Organizations: Not on file  . Attends Archivist Meetings: Not on file  . Marital Status: Not on file  Intimate Partner Violence:   . Fear of Current or Ex-Partner: Not on file  . Emotionally Abused: Not on file  . Physically Abused: Not on file  . Sexually Abused: Not on file    FAMILY HISTORY: Family History  Problem Relation Age of Onset  .  Cancer Mother 42       breasst cancer  . Aortic aneurysm Father     ALLERGIES:  has No Known Allergies.  MEDICATIONS:  Current Outpatient Medications  Medication Sig Dispense Refill  . amLODipine (NORVASC) 5 MG tablet Take by mouth.    Marland Kitchen atorvastatin (LIPITOR) 10 MG tablet Take by mouth.    Marland Kitchen BIKTARVY 50-200-25 MG TABS tablet TAKE 1 TABLET BY MOUTH ONCE DAILY 30 tablet 5  . Multiple Vitamin (MULTIVITAMIN) tablet Take 1 tablet by mouth daily.     No current facility-administered medications for this visit.    PHYSICAL EXAMINATION: ECOG PERFORMANCE STATUS: 0 - Asymptomatic  Vitals:   08/21/19 1502  BP: (!) 171/97  Pulse: 69  Resp: 18  Temp: 98 F (36.7 C)  SpO2: 100%   Filed Weights   08/21/19 1502  Weight: 212 lb 1.6 oz (96.2 kg)    GENERAL:alert, no distress and comfortable SKIN: skin color, texture, turgor are normal, no rashes or significant lesions EYES: normal, Conjunctiva are pink and non-injected, sclera clear  NECK: supple, thyroid normal size, non-tender, without  nodularity LYMPH:  no palpable lymphadenopathy in the cervical, axillary or inguinal LUNGS: clear to auscultation and percussion with normal breathing effort HEART: regular rate & rhythm and no murmurs and no lower extremity edema ABDOMEN:abdomen soft, non-tender and normal bowel sounds Musculoskeletal:no cyanosis of digits and no clubbing  NEURO: alert & oriented x 3 with fluent speech, no focal motor/sensory deficits RECTAL: I was only able to get into about 3cm due to sphincter contraction, no palpable mass in low rectum or anal canal. No blood on glove.   LABORATORY DATA:  I have reviewed the data as listed CBC Latest Ref Rng & Units 09/15/2018 09/15/2017 11/11/2016  WBC 3.8 - 10.8 Thousand/uL 9.4 6.6 6.9  Hemoglobin 13.2 - 17.1 g/dL 15.5 16.1 15.8  Hematocrit 38.5 - 50.0 % 43.2 44.7 45.2  Platelets 140 - 400 Thousand/uL 238 225 237    CMP Latest Ref Rng & Units 09/15/2018 09/15/2017 12/29/2016  Glucose 65 - 99 mg/dL 113(H) 89 91  BUN 7 - 25 mg/dL '12 20 18  '$ Creatinine 0.60 - 1.35 mg/dL 1.16 0.98 1.43(H)  Sodium 135 - 146 mmol/L 139 140 138  Potassium 3.5 - 5.3 mmol/L 3.6 3.8 3.9  Chloride 98 - 110 mmol/L 104 104 103  CO2 20 - 32 mmol/L '26 27 23  '$ Calcium 8.6 - 10.3 mg/dL 9.5 9.6 9.3  Total Protein 6.1 - 8.1 g/dL 6.9 7.0 -  Total Bilirubin 0.2 - 1.2 mg/dL 1.7(H) 0.7 -  Alkaline Phos 40 - 115 U/L - - -  AST 10 - 40 U/L 18 27 -  ALT 9 - 46 U/L 23 31 -   His recent CBC and CMP at [facility] from 07/04/2019 reviewed, unremarkable   Will get his base CEA from Dr. Lorie Apley office   RADIOGRAPHIC STUDIES: I have personally reviewed the radiological images as listed and agreed with the findings in the report. MR PELVIS WO CONTRAST  Result Date: 08/02/2019 CLINICAL DATA:  Newly diagnosed rectal carcinoma.  Local staging. EXAM: MRI PELVIS WITHOUT CONTRAST TECHNIQUE: Multiplanar multisequence MR imaging of the pelvis was performed. No intravenous contrast was administered. Small amount of [place] gel was  administered per rectum to optimize tumor evaluation. COMPARISON:  None. FINDINGS: TUMOR LOCATION Tumor distance from Anal Verge/Skin Surface:  5.0 cm Tumor distance to Internal Anal Sphincter: 0 cm TUMOR DESCRIPTION Circumferential Extent: Right anterior rectal wall, from 10-1  o'clock positions Tumor Length: 3.6 cm T - CATEGORY Extension through Muscularis Propria: Yes, measuring approximately 9 mm= T3c Shortest Distance of any tumor/node from Mesorectal Fascia: 7 mm from right lateral perirectal lymph node to adjacent mesorectal fascia (image 16/4) Extramural Vascular Invasion/Tumor Thrombus: No Invasion of Anterior Peritoneal Reflection: No Involvement of Adjacent Organs or Pelvic Sidewall: No Levator Ani Involvement: No N - CATEGORY Mesorectal Lymph Nodes >=27m: 6 mm lymph node in right perirectal space on image 16/4, and 5 mm lymph node in left perirectal space =N1 Extra-mesorectal Lymphadenopathy: No Other:  None. IMPRESSION: Rectal adenocarcinoma T stage: T3c Rectal adenocarcinoma N stage:  N1 Distance from tumor to the internal anal sphincter is 0 cm. Electronically Signed   By: JMarlaine HindM.D.   On: 08/02/2019 11:26    ASSESSMENT & PLAN:  BBERNAL LUHMANis a 49y.o. male with a history of HLD, HTN, Tinea Versicolor, HIV positive   1. Low rectal adenocarcinoma, cT3N1M0, Stage IIIB -He presented with intermittent rectal bleeding for 2 years, but otherwise asymptomatic, this was discovered on screening colonoscopy.   -We reviewed and discussed his image findings and pathology report in great detail with patient and his wife.  -colonoscopy and scan shows he has low rectum mass which is close to sphincter. Biopsy from screening colonoscopy showed this is moderately-differentiated adenocarcinoma locally advanced. Based on MRI, this is cT3N1 stage III disease.  -I recommend total neoadjuvant chemotherapy and chemoRT  before proceeding with curative surgery. He was seen by colorectal surgeon Dr.  TMarcello Mooreswho agrees with neoadjuvant chemoRT  -He plans to consult with Dr. MLisbeth Renshawon 08/22/19.  -I discussed neoadjuvant treatment with chemo FOLFOX q2weeks for 8 cycles or CAPOX with oral Xeloda q3weeks for 5 cycles before proceeding concurrent chemo RT with Xeloda. I will check if there is interaction between Xeloda and his HIV meds  --Chemotherapy consent: Side effects including but does not limited to, fatigue, nausea, vomiting, diarrhea, hair loss, cold sensitivity, neuropathy, fluid retention, renal and kidney dysfunction, neutropenic fever, needed for blood transfusion, bleeding, were discussed with patient in great detail. He is interested in chemo and will think about which option to proceed with. Plan to start next week. I gave print out of medications.  -The goal of chemotherapy is curative -I discussed option to forego surgery if he has complete response to neoadjuvant treatment, but given young age and low possibility of complete response from chemo and radiation, I recommend surgery which is the standard care now.  -Will discuss his case this week's GI Tumor board later this week.  -Given he is on Biktavy, will given growth factor after chemo to reduce his risk of infection if he gets FOLFOX -We discussed chemo-induced infertility, he does not plan to have children.  I strongly encourage him to use condom for contraceptive and protect his partner  -He will have chemo education class before start of treatment.  -I will obtain MMR to rule out Lynch syndrome  -I reviewed Labs from PCP on 07/04/19.  -plan to start chemo later next week   2. Rectal bleeding, secondary to #1 -Has been intermittent and stable for the last 2 years.  -He has had loose stool once daily lately.  -He did not have anemia on 07/04/19 labs with PCP. Will monitor on neoadjuvant treatment.    3. HIV, controlled  -He denies h/o or current use of recreational or IV drugs. He is unsure how he initially got infected.    -Controlled  and undetectable. 09/15/18 CD4 was 350.  -He is on Biktavy. This is managed by Dr. Megan Salon.  -I will check for any drug interaction with his chemo.  -I recommend he f/u with ID physician for updated CD4 level.    4. HLD, HTN -He will continue medications and f/u with PCP    PLAN:  -Chemo education class in 1-2 weeks  -he will think about FOLFOX vs CAPOX, I will check the drug interaction between Xeloda and Biiktarvy -Lab, F/u and chemo next week. -will copy Frs. Fredda Hammed and [place]    No orders of the defined types were placed in this encounter.   All questions were answered. The patient knows to call the clinic with any problems, questions or concerns. The total time spent in the appointment was 60 minutes.     Truitt Merle, MD 08/21/2019 10:52 PM  I, Joslyn Devon, am acting as scribe for Truitt Merle, MD.   I have reviewed the above documentation for accuracy and completeness, and I agree with the above.

## 2019-08-17 NOTE — Telephone Encounter (Signed)
Received a new pt referral from Dr. Marcello Moores for rectal cancer. Mr. Joshua Zhang has been scheduled to see Dr. Burr Medico on 2/1 at 3pm. Appt date and time was given to the pt's wife. She's been made aware for them to arrive 15 minutes early.

## 2019-08-21 ENCOUNTER — Telehealth: Payer: Self-pay

## 2019-08-21 ENCOUNTER — Telehealth: Payer: Self-pay | Admitting: Hematology

## 2019-08-21 ENCOUNTER — Inpatient Hospital Stay: Payer: BC Managed Care – PPO | Attending: Hematology | Admitting: Hematology

## 2019-08-21 ENCOUNTER — Other Ambulatory Visit: Payer: Self-pay

## 2019-08-21 ENCOUNTER — Encounter: Payer: Self-pay | Admitting: Hematology

## 2019-08-21 DIAGNOSIS — Z21 Asymptomatic human immunodeficiency virus [HIV] infection status: Secondary | ICD-10-CM | POA: Diagnosis not present

## 2019-08-21 DIAGNOSIS — Z803 Family history of malignant neoplasm of breast: Secondary | ICD-10-CM | POA: Diagnosis not present

## 2019-08-21 DIAGNOSIS — R5383 Other fatigue: Secondary | ICD-10-CM | POA: Insufficient documentation

## 2019-08-21 DIAGNOSIS — E785 Hyperlipidemia, unspecified: Secondary | ICD-10-CM | POA: Insufficient documentation

## 2019-08-21 DIAGNOSIS — D124 Benign neoplasm of descending colon: Secondary | ICD-10-CM | POA: Insufficient documentation

## 2019-08-21 DIAGNOSIS — Z5111 Encounter for antineoplastic chemotherapy: Secondary | ICD-10-CM | POA: Diagnosis not present

## 2019-08-21 DIAGNOSIS — D125 Benign neoplasm of sigmoid colon: Secondary | ICD-10-CM | POA: Diagnosis not present

## 2019-08-21 DIAGNOSIS — D122 Benign neoplasm of ascending colon: Secondary | ICD-10-CM | POA: Diagnosis not present

## 2019-08-21 DIAGNOSIS — Z7289 Other problems related to lifestyle: Secondary | ICD-10-CM | POA: Diagnosis not present

## 2019-08-21 DIAGNOSIS — Z79899 Other long term (current) drug therapy: Secondary | ICD-10-CM | POA: Insufficient documentation

## 2019-08-21 DIAGNOSIS — M25519 Pain in unspecified shoulder: Secondary | ICD-10-CM | POA: Insufficient documentation

## 2019-08-21 DIAGNOSIS — K625 Hemorrhage of anus and rectum: Secondary | ICD-10-CM | POA: Insufficient documentation

## 2019-08-21 DIAGNOSIS — Z5189 Encounter for other specified aftercare: Secondary | ICD-10-CM | POA: Diagnosis not present

## 2019-08-21 DIAGNOSIS — D123 Benign neoplasm of transverse colon: Secondary | ICD-10-CM | POA: Diagnosis not present

## 2019-08-21 DIAGNOSIS — I700 Atherosclerosis of aorta: Secondary | ICD-10-CM | POA: Diagnosis not present

## 2019-08-21 DIAGNOSIS — C20 Malignant neoplasm of rectum: Secondary | ICD-10-CM | POA: Diagnosis not present

## 2019-08-21 DIAGNOSIS — I10 Essential (primary) hypertension: Secondary | ICD-10-CM | POA: Insufficient documentation

## 2019-08-21 DIAGNOSIS — Z87891 Personal history of nicotine dependence: Secondary | ICD-10-CM | POA: Insufficient documentation

## 2019-08-21 DIAGNOSIS — Z8249 Family history of ischemic heart disease and other diseases of the circulatory system: Secondary | ICD-10-CM | POA: Insufficient documentation

## 2019-08-21 DIAGNOSIS — K5730 Diverticulosis of large intestine without perforation or abscess without bleeding: Secondary | ICD-10-CM | POA: Insufficient documentation

## 2019-08-21 MED ORDER — CAPECITABINE 500 MG PO TABS: ORAL_TABLET | ORAL | 0 refills | Status: DC

## 2019-08-21 NOTE — Telephone Encounter (Signed)
I cld Dr. Lorie Apley office, [facility],  to request path rpt and colonoscopy to be faxed to our office before pt's appt tomorrow w/Dr. Burr Medico. I lft a vm to [place] w/ the pt's info.

## 2019-08-21 NOTE — Telephone Encounter (Signed)
Spoke with Joshua Zhang at [employer] [facility] regarding pathology report Accession [id number], inquired if there was MMR or MSI and he noted there was not.  He faxed an additional test requisition form, filled out and faxed back a request for MMR as it is a one day turnaround (per Dr. Burr Medico)  MSI was a send out.

## 2019-08-21 NOTE — Progress Notes (Signed)
GI Location of Tumor / Histology: Rectal Cancer-Low rectal adenocarcinoma  Joshua Zhang presented  Staging form: Colon and Rectum, AJCC 8th Edition - Clinical stage from 08/21/2019: Stage IIIB (cT3, cN1, cM0) - Signed by Truitt Merle, MD on 08/21/2019  MRI Pelvis 08/02/2019: Distal rectal lesion which appears to be abutting the internal sphincter complex.  Tumor Length: 3.6 cm Rectal adenocarcinoma T stage: T3c  Rectal adenocarcinoma N stage:  N1  Distance from tumor to the internal anal sphincter is 0 cm.  CT CAP 07/12/2019: 1. No evidence of metastatic disease within the chest, abdomen or pelvis.  2. Possible mild wall thickening in the sigmoid colon, although no focal colonic mass is identified. There are few distal colonic diverticula.  3. Sequela of prior granulomatous disease.  4.Possible cholelithiasis with mild wall thickening of the gallbladder fundus.  Colonoscopy 07/03/2019 IMPRESSION -Likely Malignant 5cm, circumferential, bleeding tumor in the rectum, 304 cm from the anal verge -biopsy done  -One 8 mm sessile polyp in the distal sigmoid colon, removed with a hot snare x2; resected and retrieved.  -Two small sessile polyps, 1 in the mid-descending colon and 1 in the mid transverse colon - removed by cold biopsies.  -One 76mm sessile polyp in the mid transverse colon, removed with a hot snare x1; resected and retrieved.  -One 31mm sessile polyp in the proximal ascending colon, removed with a hot snare X1; resected and retrieved.  -few large scattered diverticula.   Biopsies of  Rectum 07/03/2019 A. Colon, Descending, Polyp, Polypectomy:              -TUBULAR ADENOMA                  -No high grade dysplasia or malignancy.  B. Colon, transverse, polyp, polypectomy:              -TUBULAR ADENOMA             -No high grade dysplasia or malignancy. C. Colon, Ascending, Polyp, Polypectomy:              -Fragments of sessile serrated adenoma/polyp D. Colon, sigmoid, polyp,  polypectomy:              -Fragments of Tubular Adenoma             -No high grade dysplasia or malignancy. E. Rectum, Mass, Biopsy:              -INVASIVE COLONIC ADENOCARCINOMA, MODERATELY-DIFFERENTIATED, see comment    Past/Anticipated interventions by surgeon, if any:  Dr. Marcello Moores 08/15/2019 -Surgical resection will be extremely difficult given the location of the tumor.   -I have recommended standard rectal cancer treatment including preoperative chemotherapy and radiation. -We discussed that if there is no improvement in the size of the tumor, he will not have enough margin to perform a coloanal anastomosis and will require an abdominal perineal resection. -If he does get significant improvement, we will attempt a coloanal anastomosis. -We discussed that this would require a temporary ileostomy. -I have referred him for oncology and radiation oncology appointments. -Follow-up in the office in 3 months.   Past/Anticipated interventions by medical oncology, if any:  Dr. Burr Medico 08/21/2019  -He went for his first screening colonoscopy with Dr. Collene Mares and a mass was found. He had several polyps removed and exam showed diverticulosis. One of his polyps was found to be cancerous. -I recommend total neoadjuvant chemotherapy and chemoRT  before proceeding with curative surgery. He was seen by colorectal  surgeon Dr. Marcello Moores who agrees with neoadjuvant chemoRT  -He plans to consult with Dr. Lisbeth Renshaw on 08/22/19.  -I discussed neoadjuvant treatment with chemo FOLFOX q2weeks for 8 cycles or CAPOX with oral Xeloda q3weeks for 5 cycles before proceeding concurrent chemo RT with Xeloda. I will check if there is interaction between Xeloda and his HIV meds. -I discussed option to forego surgery if he has complete response to neoadjuvant treatment, but given young age and low possibility of complete response from chemo and radiation, I recommend surgery which is the standard care now.   Weight changes, if any:  No  Bowel/Bladder complaints, if any: No  Nausea / Vomiting, if any: No  Pain issues, if any:  No  Any blood per rectum: Occasional, small amounts.     SAFETY ISSUES:  Prior radiation? No  Pacemaker/ICD? No  Possible current pregnancy? n/a  Is the patient on methotrexate? No  Current Complaints/Details:

## 2019-08-21 NOTE — Telephone Encounter (Signed)
Requested colonscopy notes and pathology from Dr. Lorie Apley office. Stated they would fax.

## 2019-08-22 ENCOUNTER — Telehealth: Payer: Self-pay | Admitting: Pharmacist

## 2019-08-22 ENCOUNTER — Encounter: Payer: Self-pay | Admitting: Radiation Oncology

## 2019-08-22 ENCOUNTER — Ambulatory Visit
Admission: RE | Admit: 2019-08-22 | Discharge: 2019-08-22 | Disposition: A | Discharge status: Home / Self Care | Payer: BC Managed Care – PPO | Source: Ambulatory Visit | Attending: Radiation Oncology | Admitting: Radiation Oncology

## 2019-08-22 ENCOUNTER — Telehealth: Payer: Self-pay

## 2019-08-22 ENCOUNTER — Encounter: Payer: Self-pay | Admitting: Hematology

## 2019-08-22 ENCOUNTER — Telehealth: Payer: Self-pay | Admitting: Hematology

## 2019-08-22 DIAGNOSIS — C20 Malignant neoplasm of rectum: Secondary | ICD-10-CM

## 2019-08-22 DIAGNOSIS — Z21 Asymptomatic human immunodeficiency virus [HIV] infection status: Secondary | ICD-10-CM | POA: Diagnosis not present

## 2019-08-22 MED ORDER — CAPECITABINE 500 MG PO TABS: ORAL_TABLET | ORAL | 0 refills | Status: DC

## 2019-08-22 NOTE — Progress Notes (Addendum)
Radiation Oncology         [phone] ________________________________  Name: Joshua Zhang        MRN: [id number]  Date of Service: 08/22/2019 DOB: 1970-09-29  CC:Heywood Bene, PA-C  [place], [place], *     REFERRING PHYSICIAN: Heywood Bene, * Dr. Michel Bickers   DIAGNOSIS: The encounter diagnosis was Rectal adenocarcinoma [facility]).   HISTORY OF PRESENT ILLNESS: Joshua Zhang is a 49 y.o. male seen at the request of Dr. Burr Medico with a newly diagnosed cancer of the distal rectum.  The patient has been experiencing symptoms of bleeding for approximately 2 years off and on, his symptoms progressed and he was seen by GI.  He underwent colonoscopy with Dr. Collene Mares recently, the date of his procedure and pathology report are unavailable for review but per Dr. Manon Hilding notes this revealed multiple polyps throughout the colon and the lesion in the distal rectum, biopsies confirmed adenocarcinoma within the rectal lesion.  He did undergo CT imaging of the chest abdomen and pelvis on 07/12/2019 which were negative for metastatic disease.  He did undergo an MRI on 08/02/2019 revealing a T3c lesion in the distal rectum with nodal involvement classified as N1 disease.  He has met with Dr. Marcello Moores and is aware of the location of his tumor being possibly problematic as it abuts the internal sphincter complex, he may require APR but will determine next steps with her following neoadjuvant therapy.  His CEA is 2.5.  He met with Dr. Renaee Munda yesterday who plans total neoadjuvant chemotherapy upfront followed by chemoradiation.  He is seen via my chart today to discuss the rationale for chemo RT at the appropriate interval.   PREVIOUS RADIATION THERAPY: No   PAST MEDICAL HISTORY:  Past Medical History:  Diagnosis Date  . Hypertension        PAST SURGICAL HISTORY: Past Surgical History:  Procedure Laterality Date  . APPENDECTOMY     49 years old   . SHOULDER SURGERY Bilateral 49  years old     FAMILY HISTORY:  Family History  Problem Relation Age of Onset  . Cancer Mother 5       breasst cancer  . Aortic aneurysm Father      SOCIAL HISTORY:  reports that he has never smoked. He has never used smokeless tobacco. He reports current alcohol use of about 24.0 standard drinks of alcohol per week. He reports current drug use. Drug: Opium. The patient is married and lives in [place]. He works in Data processing manager for [employer].   ALLERGIES: Patient has no known allergies.   MEDICATIONS:  Current Outpatient Medications  Medication Sig Dispense Refill  . amLODipine (NORVASC) 5 MG tablet Take by mouth.    Marland Kitchen atorvastatin (LIPITOR) 10 MG tablet Take by mouth.    Marland Kitchen BIKTARVY 50-200-25 MG TABS tablet TAKE 1 TABLET BY MOUTH ONCE DAILY 30 tablet 5  . capecitabine (XELODA) 500 MG tablet Take 4 tabs in morning and 3 tabs in evening, every 12 hours, 2 weeks on and one week off 84 tablet 0  . Multiple Vitamin (MULTIVITAMIN) tablet Take 1 tablet by mouth daily.     No current facility-administered medications for this encounter.     REVIEW OF SYSTEMS: On review of systems, the patient reports that he is doing well overall. He denies any chest pain, shortness of breath, cough, fevers, chills, night sweats, unintended weight changes. He reports his rectal bleeding has improved. He  denies any bowel or bladder disturbances, and denies abdominal pain, nausea or vomiting. He denies any new musculoskeletal or joint aches or pains. A complete review of systems is obtained and is otherwise negative.     PHYSICAL EXAM:  Unable to assess due to encounter type.  In general this is a well appearing caucasian male in no acute distress. He's alert and oriented x4 and appropriate throughout the examination. Cardiopulmonary assessment is negative for acute distress and he exhibits normal effort.    ECOG = 0  0 - Asymptomatic (Fully active, able to carry on all  predisease activities without restriction)  1 - Symptomatic but completely ambulatory (Restricted in physically strenuous activity but ambulatory and able to carry out work of a light or sedentary nature. For example, light housework, office work)  2 - Symptomatic, <50% in bed during the day (Ambulatory and capable of all self care but unable to carry out any work activities. Up and about more than 50% of waking hours)  3 - Symptomatic, >50% in bed, but not bedbound (Capable of only limited self-care, confined to bed or chair 50% or more of waking hours)  4 - Bedbound (Completely disabled. Cannot carry on any self-care. Totally confined to bed or chair)  5 - Death   Eustace Pen MM, Creech RH, Tormey DC, et al. [phone]). "Toxicity and response criteria of the [facility] Group". [place] Oncol. 5 (6): 649-55    LABORATORY DATA:  Lab Results  Component Value Date   WBC 9.4 09/15/2018   HGB 15.5 09/15/2018   HCT 43.2 09/15/2018   MCV 94.7 09/15/2018   PLT 238 09/15/2018   Lab Results  Component Value Date   NA 139 09/15/2018   K 3.6 09/15/2018   CL 104 09/15/2018   CO2 26 09/15/2018   Lab Results  Component Value Date   ALT 23 09/15/2018   AST 18 09/15/2018   ALKPHOS 82 11/11/2016   BILITOT 1.7 (H) 09/15/2018      RADIOGRAPHY: MR PELVIS WO CONTRAST  Result Date: 08/02/2019 CLINICAL DATA:  Newly diagnosed rectal carcinoma.  Local staging. EXAM: MRI PELVIS WITHOUT CONTRAST TECHNIQUE: Multiplanar multisequence MR imaging of the pelvis was performed. No intravenous contrast was administered. Small amount of [place] gel was administered per rectum to optimize tumor evaluation. COMPARISON:  None. FINDINGS: TUMOR LOCATION Tumor distance from Anal Verge/Skin Surface:  5.0 cm Tumor distance to Internal Anal Sphincter: 0 cm TUMOR DESCRIPTION Circumferential Extent: Right anterior rectal wall, from 10-1 o'clock positions Tumor Length: 3.6 cm T - CATEGORY Extension through  Muscularis Propria: Yes, measuring approximately 9 mm= T3c Shortest Distance of any tumor/node from Mesorectal Fascia: 7 mm from right lateral perirectal lymph node to adjacent mesorectal fascia (image 16/4) Extramural Vascular Invasion/Tumor Thrombus: No Invasion of Anterior Peritoneal Reflection: No Involvement of Adjacent Organs or Pelvic Sidewall: No Levator Ani Involvement: No N - CATEGORY Mesorectal Lymph Nodes >=54m: 6 mm lymph node in right perirectal space on image 16/4, and 5 mm lymph node in left perirectal space =N1 Extra-mesorectal Lymphadenopathy: No Other:  None. IMPRESSION: Rectal adenocarcinoma T stage: T3c Rectal adenocarcinoma N stage:  N1 Distance from tumor to the internal anal sphincter is 0 cm. Electronically Signed   By: JMarlaine HindM.D.   On: 08/02/2019 11:26       IMPRESSION/PLAN: 1. Stage IIIB, cT3N1M0 adenocarcinoma of the distal rectum. Dr. MLisbeth Renshawdiscusses the pathology findings and reviews the nature of rectal cancer. He is contemplating which  systemic option he will move forward with with total neoadjuvant chemotherapy will be administered first. We reviewed the rationale for chemoRT followed by surgical resection We discussed the risks, benefits, short, and long term effects of radiotherapy, and the patient is interested in proceeding. Dr. Lisbeth Renshaw discusses the delivery and logistics of radiotherapy and anticipates a course of 5 1/2 weeks of radiotherapy following completion of his systemic chemotherapy. We will plan to see him back in about 2-3 months to consider proceeding with chemoRT. He is in agreement with this plan. 2. HIV infection. He has an undetectable viral load and is followed by Dr. Megan Salon. He will be followed during systemic therapy as well with Dr. Burr Medico regarding counts.  This encounter was provided by telemedicine platform MyChart.  The patient has given verbal consent for this type of encounter and has been advised to only accept a meeting of this type in a  secure network environment. The time spent during this encounter was 45 minutes. The attendants for this meeting include Blenda Nicely, RN, Dr. Lisbeth Renshaw, Hayden Pedro  and Greta Doom. His wife [name] Rierson was also present. During the encounter,  Blenda Nicely, RN, Dr. Lisbeth Renshaw, and Hayden Pedro were located at [facility] Radiation Oncology Department.  Greta Doom and his wife were located at home.    The above documentation reflects my direct findings during this shared patient visit. Please see the separate note by Dr. Lisbeth Renshaw on this date for the remainder of the patient's plan of care.    Carola Rhine, PAC

## 2019-08-22 NOTE — Telephone Encounter (Signed)
Scheduled appt per 2/1 los.  Spoke with pt spouse and she is aware of the appt date and time

## 2019-08-22 NOTE — Telephone Encounter (Signed)
Oral Oncology Pharmacist Encounter  Received new prescription for Xeloda (capecitabine) for the neoadjuvant treatment of newly diagnosed stage IIIB rectal cancer in conjunction with oxilaplatin, planned duration 5 cycles.  CMP from 07/04/2019 assessed, no relevant lab abnormalities (Care Everywhere). Recommend obtaining a more recent CMP. Prescription dose and frequency assessed.   Current medication list in Epic reviewed, no DDIs with capecitabine identified.  Prescription has been e-scribed to the [facility] for benefits analysis and approval.  Oral Oncology Clinic will continue to follow for insurance authorization, copayment issues, initial counseling and start date.  Darl Pikes, PharmD, BCPS, [facility] Hematology/Oncology Clinical Pharmacist [facility] [place] [facility] [phone]  08/22/2019 8:58 AM

## 2019-08-22 NOTE — Telephone Encounter (Signed)
Patient's wife called office with additional questions regarding patients recent diagnoses of stage 3 rectal cancer. Would like to know if MD would be able to speak with them before scheduled appointment. Would like to know if chemo will have any effects on patient's HIV or treatment. Would also like to know what effects this might have on patient. Patient is supposed to receive 8 weeks of chemo. Will forward message to MD. Aundria Rud, [place]

## 2019-08-22 NOTE — Telephone Encounter (Signed)
Oral Oncology Patient Advocate Encounter  Received notification from [employer] that prior authorization for Xeloda is required.  PA submitted on CoverMyMeds Key [id number] Status is pending  Oral Oncology Clinic will continue to follow.  [place] Patient [place] Phone [phone] Fax [phone] 08/22/2019 11:33 AM

## 2019-08-23 ENCOUNTER — Telehealth: Payer: Self-pay | Admitting: Internal Medicine

## 2019-08-23 ENCOUNTER — Other Ambulatory Visit: Payer: Self-pay

## 2019-08-23 ENCOUNTER — Other Ambulatory Visit: Payer: BC Managed Care – PPO

## 2019-08-23 ENCOUNTER — Telehealth: Payer: Self-pay

## 2019-08-23 ENCOUNTER — Other Ambulatory Visit: Payer: Self-pay | Admitting: Hematology

## 2019-08-23 DIAGNOSIS — C20 Malignant neoplasm of rectum: Secondary | ICD-10-CM

## 2019-08-23 DIAGNOSIS — B20 Human immunodeficiency virus [HIV] disease: Secondary | ICD-10-CM | POA: Diagnosis not present

## 2019-08-23 MED ORDER — PROCHLORPERAZINE MALEATE 10 MG PO TABS: 10.0000 mg | ORAL_TABLET | Freq: Four times a day (QID) | ORAL | 1 refills | Status: DC | PRN

## 2019-08-23 MED ORDER — LIDOCAINE-PRILOCAINE 2.5-2.5 % EX CREA: TOPICAL_CREAM | CUTANEOUS | 3 refills | Status: DC

## 2019-08-23 MED ORDER — ONDANSETRON HCL 8 MG PO TABS: 8.0000 mg | ORAL_TABLET | Freq: Two times a day (BID) | ORAL | 1 refills | Status: DC | PRN

## 2019-08-23 MED FILL — ONDANSETRON HCL 8 MG TABLET: 8 | 30 days supply | Qty: 24 | Fill #0

## 2019-08-23 MED FILL — PROCHLORPERAZINE 10 MG TAB: 10 | 7 days supply | Qty: 30 | Fill #0

## 2019-08-23 MED FILL — LIDOCAINE-PRILOCAINE CREAM: 2.5-2.5 | 10 days supply | Qty: 30 | Fill #0

## 2019-08-23 NOTE — Telephone Encounter (Signed)
Dr. Lorie Apley office will fax over lates lab results

## 2019-08-23 NOTE — Telephone Encounter (Signed)
I called and spoke with Joshua Zhang's wife, Joshua Zhang, today.  They wanted to make sure that I was aware of his recent diagnosis of rectal cancer and I told him that I was.  I have scheduled him for a visit on 09/07/2019.  We will make sure that there are not any significant drug drug interactions between [place] and his chemotherapy.  I also told her that his CD4 count and HIV viral load on a more regular basis during his treatment.

## 2019-08-23 NOTE — Telephone Encounter (Signed)
Oral Chemotherapy Pharmacist Encounter   Received notification that patient has decided to proceed with an all IV regimen.  Darl Pikes, PharmD, BCPS, [facility] Hematology/Oncology Clinical Pharmacist [facility] Oral [place] [facility] [phone]  08/23/2019 3:12 PM

## 2019-08-23 NOTE — Progress Notes (Unsigned)
IMG 

## 2019-08-23 NOTE — Progress Notes (Signed)
START ON PATHWAY REGIMEN - Colorectal     A cycle is every 14 days:     Oxaliplatin      Leucovorin      Fluorouracil      Fluorouracil   **Always confirm dose/schedule in your pharmacy ordering system**  Patient Characteristics: Preoperative or Nonsurgical Candidate (Clinical Staging), Rectal, cT3 - cT4, cN0 or Any cT, cN+ Tumor Location: Rectal Therapeutic Status: Preoperative or Nonsurgical Candidate (Clinical Staging) AJCC T Category: cT3 AJCC N Category: cN1 AJCC M Category: cM0 AJCC 8 Stage Grouping: IIIB Intent of Therapy: Curative Intent, Discussed with Patient 

## 2019-08-23 NOTE — Telephone Encounter (Signed)
Oral Oncology Patient Advocate Encounter  Prior Authorization for Xeloda has been approved.    PA# [id number] Effective dates: 08/22/19 through 01/18/20  Patients co-pay is $200  Oral Oncology Clinic will continue to follow.   [place] Patient [place] Phone [phone] Fax [phone] 08/23/2019 8:44 AM

## 2019-08-23 NOTE — Telephone Encounter (Signed)
Spoke with patient's wife [name] regarding patient's My Chart message deciding on IV chemotherapy.  She stated he prefers this option.  Explained he will have to have a port placed and she stated Dr. Burr Medico had already discussed this.  I informed her I am attempting to get this scheduled and will call them back with date and time for procedure.  She was given my direct contact number to reach me should she have any additional questions.

## 2019-08-24 ENCOUNTER — Telehealth: Payer: Self-pay

## 2019-08-24 ENCOUNTER — Other Ambulatory Visit: Payer: Self-pay

## 2019-08-24 ENCOUNTER — Telehealth (HOSPITAL_COMMUNITY): Payer: Self-pay

## 2019-08-24 DIAGNOSIS — C20 Malignant neoplasm of rectum: Secondary | ICD-10-CM

## 2019-08-24 LAB — T-HELPER CELL (CD4) - (RCID CLINIC ONLY)
CD4 % Helper T Cell: 34 % (ref 33–65)
CD4 T Cell Abs: 562 /uL (ref 400–1790)

## 2019-08-24 MED FILL — BIKTARVY 50-200-25 MG TABS: 50-200-25 | 30 days supply | Qty: 30 | Fill #2

## 2019-08-24 NOTE — Telephone Encounter (Signed)
Spoke with patient's wife [name] regarding appointment for port placement on Wednesday 2/10 at [facility] to arrive at 10:00 am to admitting, procedure is at 12:00, NPO for 6 hours prior to procedure, must have a driver.  She verbalized an understanding.  I explained waiting on chemotherapy approval and hopefully will be able to get scheduled for 2/11 or 2/12.

## 2019-08-24 NOTE — Telephone Encounter (Signed)
Called to schedule port placement, no answer, left vm. AW  

## 2019-08-25 LAB — CBC
HCT: 42.1 % (ref 38.5–50.0)
Hemoglobin: 15.2 g/dL (ref 13.2–17.1)
MCH: 34.5 pg — ABNORMAL HIGH (ref 27.0–33.0)
MCHC: 36.1 g/dL — ABNORMAL HIGH (ref 32.0–36.0)
MCV: 95.7 fL (ref 80.0–100.0)
MPV: 10.1 fL (ref 7.5–12.5)
Platelets: 219 10*3/uL (ref 140–400)
RBC: 4.4 10*6/uL (ref 4.20–5.80)
RDW: 12 % (ref 11.0–15.0)
WBC: 5.8 10*3/uL (ref 3.8–10.8)

## 2019-08-25 LAB — LIPID PANEL
Cholesterol: 173 mg/dL (ref <200)
HDL: 43 mg/dL (ref >=40)
LDL Cholesterol (Calc): 105 mg/dL (calc) — ABNORMAL HIGH
Non-HDL Cholesterol (Calc): 130 mg/dL (calc) — ABNORMAL HIGH (ref <130)
Total CHOL/HDL Ratio: 4 (calc) (ref <5.0)
Triglycerides: 139 mg/dL (ref <150)

## 2019-08-25 LAB — COMPREHENSIVE METABOLIC PANEL
AG Ratio: 1.7 (calc) (ref 1.0–2.5)
ALT: 31 U/L (ref 9–46)
AST: 23 U/L (ref 10–35)
Albumin: 4.2 g/dL (ref 3.6–5.1)
Alkaline phosphatase (APISO): 84 U/L (ref 35–144)
BUN: 14 mg/dL (ref 7–25)
CO2: 26 mmol/L (ref 20–32)
Calcium: 9.5 mg/dL (ref 8.6–10.3)
Chloride: 106 mmol/L (ref 98–110)
Creat: 1.12 mg/dL (ref 0.70–1.33)
Globulin: 2.5 g/dL (calc) (ref 1.9–3.7)
Glucose, Bld: 98 mg/dL (ref 65–99)
Potassium: 4.4 mmol/L (ref 3.5–5.3)
Sodium: 140 mmol/L (ref 135–146)
Total Bilirubin: 0.8 mg/dL (ref 0.2–1.2)
Total Protein: 6.7 g/dL (ref 6.1–8.1)

## 2019-08-25 LAB — HIV-1 RNA QUANT-NO REFLEX-BLD
HIV 1 RNA Quant: 22 copies/mL — ABNORMAL HIGH
HIV-1 RNA Quant, Log: 1.34 Log copies/mL — ABNORMAL HIGH

## 2019-08-25 LAB — RPR: RPR Ser Ql: NONREACTIVE

## 2019-08-25 NOTE — Progress Notes (Signed)
Joshua Zhang   [phone] [phone]   Clinic Follow up Note   Patient Care Team: Joshua Zhang as PCP - General (Physician Assistant) Joshua Bickers, MD as Consulting Physician (Infectious Diseases) Joshua Ruff, MD as Consulting Physician (General Surgery) Joshua Merle, MD as Consulting Physician (Hematology) Joshua Rudd, MD as Consulting Physician (Radiation Oncology) Joshua Finner, RN as Oncology Nurse Navigator  Date of Service:  08/31/2019  CHIEF COMPLAINT: F/u of rectal cancer   SUMMARY OF ONCOLOGIC HISTORY: Oncology History Overview Note  Cancer Staging Rectal adenocarcinoma [facility]) Staging form: Colon and Rectum, AJCC 8th Edition - Clinical stage from 08/21/2019: Stage IIIB (cT3, cN1, cM0) - Signed by Joshua Merle, MD on 08/21/2019    Rectal adenocarcinoma ([place])  07/03/2019 Procedure   Colonoscopy by Dr. Collene Mares  IMPRESSION -Likely Malignant 5cm, circumferential, bleeding tumor in the rectum, 304 cm from the anal verge -biopsy done  -One 8 mm sessile polyp in the distal sigmoid colon, removed with a hot snare x2; resected and retrieved.  -Two small sessile polyps, 1 in the mid-descending colon and 1 in the mid transverse colon - removed by cold biopsies.  -One 28m sessile polyp in the mid transverse colon, removed with a hot snare x1; resected and retrieved.  -One 889msessile polyp in the proximal ascending colon, removed with a hot snare X1; resected and retrieved.  -few large scattered diverticula.   07/03/2019 Initial Biopsy   FINAL DIAGNOSIS 07/03/19  A. Colon, Descending, Polyp, Polypectomy:   -TUBULAR ADENOMA   -No high grade dysplasia or malignancy.  B. Colon, transverse, polyp, polypectomy:   -TUBULAR ADENOMA  -No high grade dysplasia or malignancy. C. Colon, Ascending, Polyp, Polypectomy:   -Fragments of sessile serrated adenoma/polyp D. Colon, sigmoid, polyp, polypectomy:   -Fragments of Tubular Adenoma  -No  high grade dysplasia or malignancy. E. Rectum, Mass, Biopsy:   -INVASIVE COLONIC ADENOCARCINOMA, MODERATELY-DIFFERENTIATED, see comment     07/12/2019 Imaging   CT AP W Contrast 07/12/19  IMPRESSION: 1. No evidence of metastatic disease within the chest, abdomen or pelvis. 2. Possible mild wall thickening in the sigmoid colon, although no focal colonic mass is identified. There are few distal colonic diverticula. 3. Sequela of prior granulomatous disease. 4. Possible cholelithiasis with mild wall thickening of the gallbladder fundus. 5. Aortic Atherosclerosis (ICD10-I70.0).   08/21/2019 Initial Diagnosis   Rectal adenocarcinoma ([facility]  08/21/2019 Cancer Staging   Staging form: Colon and Rectum, AJCC 8th Edition - Clinical stage from 08/21/2019: Stage IIIB (cT3, cN1, cM0) - Signed by FeTruitt MerleMD on 08/21/2019   08/31/2019 -  Chemotherapy   FOLFOX q2weeks for 8 cycles starting  08/31/19      CURRENT THERAPY:  FOLFOX q2weeks starting 08/31/19   INTERVAL HISTORY:  BrALBEN JEPSENs here for a follow up and treatment. He presents to the clinic with his wife.  He had PAC placed yesterday and is only mildly sore. He went to chemo class with his wife. He is overall ready to proceed with chemo today. I reviewed his medication list with him.     REVIEW OF SYSTEMS:   Constitutional: Denies fevers, chills or abnormal weight loss Eyes: Denies blurriness of vision Ears, nose, mouth, throat, and face: Denies mucositis or sore throat Respiratory: Denies cough, dyspnea or wheezes Cardiovascular: Denies palpitation, chest discomfort or lower extremity swelling Gastrointestinal:  Denies nausea, heartburn or change in bowel habits Skin: Denies abnormal skin rashes Lymphatics: Denies new lymphadenopathy or  easy bruising Neurological:Denies numbness, tingling or new weaknesses Behavioral/Psych: Mood is stable, no new changes  All other systems were reviewed with the patient and are  negative.  MEDICAL HISTORY:  Past Medical History:  Diagnosis Date   Hypertension     SURGICAL HISTORY: Past Surgical History:  Procedure Laterality Date   APPENDECTOMY     49 years old    IR IMAGING GUIDED PORT INSERTION  08/30/2019   SHOULDER SURGERY Bilateral 49 years old    I have reviewed the social history and family history with the patient and they are unchanged from previous note.  ALLERGIES:  has No Known Allergies.  MEDICATIONS:  Current Outpatient Medications  Medication Sig Dispense Refill   amLODipine (NORVASC) 5 MG tablet Take by mouth.     atorvastatin (LIPITOR) 10 MG tablet Take by mouth.     BIKTARVY 50-200-25 MG TABS tablet TAKE 1 TABLET BY MOUTH ONCE DAILY 30 tablet 5   lidocaine-prilocaine (EMLA) cream Apply to affected area once 30 g 3   losartan (COZAAR) 25 MG tablet Take by mouth.     Multiple Vitamin (MULTIVITAMIN) tablet Take 1 tablet by mouth daily.     ondansetron (ZOFRAN) 8 MG tablet Take 1 tablet (8 mg total) by mouth 2 (two) times daily as needed for refractory nausea / vomiting. Start on day 3 after chemotherapy. 30 tablet 1   prochlorperazine (COMPAZINE) 10 MG tablet Take 1 tablet (10 mg total) by mouth every 6 (six) hours as needed (Nausea or vomiting). 30 tablet 1   No current facility-administered medications for this visit.    PHYSICAL EXAMINATION: ECOG PERFORMANCE STATUS: 0 - Asymptomatic  Vitals:   08/31/19 0835  BP: (!) 166/100  Pulse: 87  Resp: 17  Temp: 97.8 F (36.6 C)  SpO2: 100%   Filed Weights   08/31/19 0835  Weight: 211 lb 9.6 oz (96 kg)   Due to COVID19 we will limit examination to appearance. Patient had no complaints.  GENERAL:alert, no distress and comfortable SKIN: skin color normal, no rashes or significant lesions (+) right chest PAC in place  EYES: normal, Conjunctiva are pink and non-injected, sclera clear  NEURO: alert & oriented x 3 with fluent speech   LABORATORY DATA:  I have reviewed  the data as listed CBC Latest Ref Rng & Units 08/31/2019 08/23/2019 09/15/2018  WBC 4.0 - 10.5 K/uL 5.3 5.8 9.4  Hemoglobin 13.0 - 17.0 g/dL 14.8 15.2 15.5  Hematocrit 39.0 - 52.0 % 39.8 42.1 43.2  Platelets 150 - 400 K/uL 199 219 238     CMP Latest Ref Rng & Units 08/31/2019 08/23/2019 09/15/2018  Glucose 70 - 99 mg/dL 106(H) 98 113(H)  BUN 6 - 20 mg/dL '15 14 12  '$ Creatinine 0.61 - 1.24 mg/dL 0.96 1.12 1.16  Sodium 135 - 145 mmol/L 138 140 139  Potassium 3.5 - 5.1 mmol/L 4.0 4.4 3.6  Chloride 98 - 111 mmol/L 108 106 104  CO2 22 - 32 mmol/L '24 26 26  '$ Calcium 8.9 - 10.3 mg/dL 8.4(L) 9.5 9.5  Total Protein 6.5 - 8.1 g/dL 6.7 6.7 6.9  Total Bilirubin 0.3 - 1.2 mg/dL 0.8 0.8 1.7(H)  Alkaline Phos 38 - 126 U/L 91 - -  AST 15 - 41 U/L '25 23 18  '$ ALT 0 - 44 U/L 34 31 23      RADIOGRAPHIC STUDIES: I have personally reviewed the radiological images as listed and agreed with the findings in the report. IR IMAGING GUIDED PORT  INSERTION  Result Date: 08/30/2019 INDICATION: 49 year old male with rectal carcinoma, referred for port catheter EXAM: IMPLANTED PORT A CATH PLACEMENT WITH ULTRASOUND AND FLUOROSCOPIC GUIDANCE MEDICATIONS: 2 g Ancef; The antibiotic was administered within an appropriate time interval prior to skin puncture. ANESTHESIA/SEDATION: Moderate (conscious) sedation was employed during this procedure. A total of Versed 3.0 mg and Fentanyl 100 mcg was administered intravenously. Moderate Sedation Time: 27 minutes. The patient's level of consciousness and vital signs were monitored continuously by radiology nursing throughout the procedure under my direct supervision. FLUOROSCOPY TIME:  0 minutes, 6 seconds (1 mGy) COMPLICATIONS: None PROCEDURE: The procedure, risks, benefits, and alternatives were explained to the patient. Questions regarding the procedure were encouraged and answered. The patient understands and consents to the procedure. Ultrasound survey was performed with images stored and  sent to PACs. The right neck and chest was prepped with chlorhexidine, and draped in the usual sterile fashion using maximum barrier technique (cap and mask, sterile gown, sterile gloves, large sterile sheet, hand hygiene and cutaneous antiseptic). Antibiotic prophylaxis was provided with 2.0g Ancef administered IV one hour prior to skin incision. Local anesthesia was attained by infiltration with 1% lidocaine without epinephrine. Ultrasound demonstrated patency of the right internal jugular vein, and this was documented with an image. Under real-time ultrasound guidance, this vein was accessed with a 21 gauge micropuncture needle and image documentation was performed. A small dermatotomy was made at the access site with an 11 scalpel. A 0.018" wire was advanced into the SVC and used to estimate the length of the internal catheter. The access needle exchanged for a 11F micropuncture vascular sheath. The 0.018" wire was then removed and a 0.035" wire advanced into the IVC. An appropriate location for the subcutaneous reservoir was selected below the clavicle and an incision was made through the skin and underlying soft tissues. The subcutaneous tissues were then dissected using a combination of blunt and sharp surgical technique and a pocket was formed. A single lumen power injectable portacatheter was then tunneled through the subcutaneous tissues from the pocket to the dermatotomy and the port reservoir placed within the subcutaneous pocket. The venous access site was then serially dilated and a peel away vascular sheath placed over the wire. The wire was removed and the port catheter advanced into position under fluoroscopic guidance. The catheter tip is positioned in the cavoatrial junction. This was documented with a spot image. The portacatheter was then tested and found to flush and aspirate well. The port was flushed with saline followed by 100 units/mL heparinized saline. The pocket was then closed in two  layers using first subdermal inverted interrupted absorbable sutures followed by a running subcuticular suture. The epidermis was then sealed with Dermabond. The dermatotomy at the venous access site was also seal with Dermabond. Patient tolerated the procedure well and remained hemodynamically stable throughout. No complications encountered and no significant blood loss encountered . IMPRESSION: Status post right IJ port catheter placement. Signed, Dulcy Fanny. Dellia Nims, RPVI [facility] Radiology Electronically Signed   By: Corrie Mckusick D.O.   On: 08/30/2019 12:54     ASSESSMENT & PLAN:  Joshua Zhang is a 49 y.o. male with    1. Low rectal adenocarcinoma, cT3N1M0, Stage IIIB, MMR normal  -He presented with intermittent rectal bleeding for 2 years, but otherwise asymptomatic, this was discovered on screening colonoscopy in 06/2019.  -Colonoscopy and scan shows he has low rectum mass which is close to sphincter. Biopsy from  screening colonoscopy showed this is moderately-differentiated adenocarcinoma locally advanced. Based on MRI, this is cT3N1 stage III disease.  -I recommend total neoadjuvant chemotherapy and chemoRT before proceeding with curative surgery. He was seen by colorectal surgeon Dr. Marcello Moores who agrees with neoadjuvant chemoRT and has consulted with Dr Lisbeth Renshaw.  -I discussed option to forego surgery if he has complete response to neoadjuvant treatment, but given young age and low possibility of complete response from chemo and radiation, I recommend surgery which is the standard care now.  -His tumor MMR was intact, lynch syndrome is ruled out  -He will start total adjuvant chemo with FOLFOX q2weeks for 8 cycles on 08/31/19. He has PAC in place.  -Labs reviewed and adequate to proceed with C1 FOLFOX today. I encouraged him to watch for cold sensitivity, fatigue, nausea, dehydration, bowel movements and low appetite. I also encouraged him  to continue COVID19 precautions and reviewed use of antiemetics and Claritin for possible bone pain with injection.  -due to his HIV status, I will hold on 5-fu bolus for first cycle and see how he dose  -F/u in 2 weeks    2. Rectal bleeding, secondary to #1 -Has been intermittent and stable for the last 2 years.  -He has had loose stool once daily lately.  -He did not have anemia on 07/04/19 labs with PCP. Will monitor on neoadjuvant treatment.    3. HIV, controlled  -He denies h/o or current use of recreational or IV drugs. He is unsure how he initially got infected.  -Controlled and undetectable. 09/15/18 CD4 was 350. His 08/23/19 CD4 is 562 -He is on Biktavy. This is managed by Dr. Megan Salon.  -I will check for any drug interaction with his chemo.    4. HLD, HTN -He will continue medications and f/u with PCP    PLAN:  -Labs reviewed and adequate to proceed with C1 FOLFOX today, will hold 5-fu bolus today and add back from next cycle if he tolerates first cycle chemo well  -lab, flush, f/u and chemo FOLFOX in 2, 3, 4, and 8 weeks    No problem-specific Assessment & Plan notes found for this encounter.   No orders of the defined types were placed in this encounter.  All questions were answered. The patient knows to call the clinic with any problems, questions or concerns. No barriers to learning was detected. The total time spent in the appointment was 30 minutes.     Joshua Merle, MD 08/31/2019   I, Joslyn Devon, am acting as scribe for Joshua Merle, MD.   I have reviewed the above documentation for accuracy and completeness, and I agree with the above.

## 2019-08-28 ENCOUNTER — Other Ambulatory Visit: Payer: Self-pay | Admitting: Hematology

## 2019-08-29 ENCOUNTER — Other Ambulatory Visit: Payer: Self-pay | Admitting: Radiology

## 2019-08-29 ENCOUNTER — Inpatient Hospital Stay: Payer: BC Managed Care – PPO

## 2019-08-30 ENCOUNTER — Other Ambulatory Visit: Payer: Self-pay

## 2019-08-30 ENCOUNTER — Ambulatory Visit (HOSPITAL_COMMUNITY)
Admission: RE | Admit: 2019-08-30 | Discharge: 2019-08-30 | Disposition: A | Discharge status: Home / Self Care | Payer: BC Managed Care – PPO | Source: Ambulatory Visit | Attending: Hematology | Admitting: Hematology

## 2019-08-30 ENCOUNTER — Encounter: Payer: Self-pay | Admitting: Hematology

## 2019-08-30 ENCOUNTER — Encounter (HOSPITAL_COMMUNITY): Payer: Self-pay

## 2019-08-30 DIAGNOSIS — I10 Essential (primary) hypertension: Secondary | ICD-10-CM | POA: Diagnosis not present

## 2019-08-30 DIAGNOSIS — C20 Malignant neoplasm of rectum: Secondary | ICD-10-CM

## 2019-08-30 DIAGNOSIS — Z803 Family history of malignant neoplasm of breast: Secondary | ICD-10-CM | POA: Insufficient documentation

## 2019-08-30 DIAGNOSIS — Z452 Encounter for adjustment and management of vascular access device: Secondary | ICD-10-CM | POA: Diagnosis not present

## 2019-08-30 DIAGNOSIS — Z79899 Other long term (current) drug therapy: Secondary | ICD-10-CM | POA: Insufficient documentation

## 2019-08-30 DIAGNOSIS — C218 Malignant neoplasm of overlapping sites of rectum, anus and anal canal: Secondary | ICD-10-CM | POA: Diagnosis not present

## 2019-08-30 HISTORY — PX: IR IMAGING GUIDED PORT INSERTION: IMG5740

## 2019-08-30 MED ORDER — LIDOCAINE HCL (PF) 1 % IJ SOLN
INTRAMUSCULAR | Status: AC | PRN
  Administered 2019-08-30: 20 mL

## 2019-08-30 MED ORDER — LIDOCAINE HCL 1 % IJ SOLN
INTRAMUSCULAR | Status: AC
  Filled 2019-08-30: qty 20

## 2019-08-30 MED ORDER — FENTANYL CITRATE (PF) 100 MCG/2ML IJ SOLN
INTRAMUSCULAR | Status: AC | PRN
  Administered 2019-08-30 (×2): 50 ug via INTRAVENOUS

## 2019-08-30 MED ORDER — CEFAZOLIN SODIUM-DEXTROSE 2-4 GM/100ML-% IV SOLN
2.0000 g | INTRAVENOUS | Status: AC
  Administered 2019-08-30: 12:00:00 2 g via INTRAVENOUS

## 2019-08-30 MED ORDER — CEFAZOLIN SODIUM-DEXTROSE 2-4 GM/100ML-% IV SOLN
INTRAVENOUS | Status: AC
  Filled 2019-08-30: qty 100

## 2019-08-30 MED ORDER — MIDAZOLAM HCL 2 MG/2ML IJ SOLN
INTRAMUSCULAR | Status: AC | PRN
  Administered 2019-08-30 (×2): 1 mg via INTRAVENOUS

## 2019-08-30 MED ORDER — FENTANYL CITRATE (PF) 100 MCG/2ML IJ SOLN
INTRAMUSCULAR | Status: AC
  Filled 2019-08-30: qty 2

## 2019-08-30 MED ORDER — LORAZEPAM 2 MG/ML IJ SOLN
INTRAMUSCULAR | Status: AC | PRN
  Administered 2019-08-30: 1 mg via INTRAVENOUS

## 2019-08-30 MED ORDER — HEPARIN SOD (PORK) LOCK FLUSH 100 UNIT/ML IV SOLN
INTRAVENOUS | Status: AC
  Administered 2019-08-30: 500 [IU]
  Filled 2019-08-30: qty 5

## 2019-08-30 MED ORDER — SODIUM CHLORIDE 0.9 % IV SOLN: INTRAVENOUS | Status: DC

## 2019-08-30 MED ORDER — MIDAZOLAM HCL 2 MG/2ML IJ SOLN
INTRAMUSCULAR | Status: AC
  Filled 2019-08-30: qty 4

## 2019-08-30 NOTE — Progress Notes (Signed)
Discharge instructions reviewed with patient and family. Verbalized understanding. 

## 2019-08-30 NOTE — Procedures (Signed)
Interventional Radiology Procedure Note  Procedure: Placement of a right IJ approach single lumen PowerPort.  Tip is positioned at the superior cavoatrial junction and catheter is ready for immediate use.  Complications: None Recommendations:  - Ok to shower tomorrow - Do not submerge for 7 days - Routine line care   Signed,  Giovani Neumeister S. Saleen Peden, DO   

## 2019-08-30 NOTE — Progress Notes (Signed)
Called ptto introduce myself as his Arboriculturist, discuss copay assistance and the [employer].  Pt was getting a port placement so I spoke to his wife.  She gave me consent to apply in his behalf so I enrolled him in the [place] Completeprogramfor [place] $15,000 for 12 monthsfrom 08/30/19.  Pt is eligible to have $0 out-of-pocket costs for each [place].  He isoverqualifiedfor the [employer].I will give himmy card on 08/30/19 for any questions or concerns he may have in the future.

## 2019-08-30 NOTE — H&P (Signed)
Chief Complaint: Rectal cancer  Referring Physician(s): Feng,Yan  Supervising Physician: Corrie Mckusick  Patient Status: [facility] - Out-pt  History of Present Illness: Joshua Zhang is a 49 y.o. male with recent diagnosis of rectal cancer.  He had some intermittent rectal bleeding for about 2 years. He went in for a colonoscopy and a 5 cm mass was in the rectum.  Biopsy confirmed moderately differentiated adenocarcinoma.  He is here today for placement of a Port A Cath.  He is NPO. He does not take blood thinners. No nausea/vomiting. No Fever/chills. ROS negative.   Past Medical History:  Diagnosis Date  . Hypertension     Past Surgical History:  Procedure Laterality Date  . APPENDECTOMY     49 years old   . SHOULDER SURGERY Bilateral 49 years old    Allergies: Patient has no known allergies.  Medications: Prior to Admission medications   Medication Sig Start Date End Date Taking? Authorizing Provider  amLODipine (NORVASC) 5 MG tablet Take by mouth. 07/26/18  Yes [provider]  atorvastatin (LIPITOR) 10 MG tablet Take by mouth. 07/26/18  Yes [provider]  BIKTARVY 50-200-25 MG TABS tablet TAKE 1 TABLET BY MOUTH ONCE DAILY 06/22/19  Yes Campbell Riches, MD  losartan (COZAAR) 25 MG tablet Take by mouth. 07/04/19  Yes [provider]  Multiple Vitamin (MULTIVITAMIN) tablet Take 1 tablet by mouth daily.   Yes [provider]  capecitabine (XELODA) 500 MG tablet Take 4 tablets by mouth in AM and 3 tablets in PM. Take with food. Take for 14 days then hold for 7 days. Repeat every 21 days. 08/22/19   Truitt Merle, MD  lidocaine-prilocaine (EMLA) cream Apply to affected area once 08/23/19   Truitt Merle, MD  ondansetron (ZOFRAN) 8 MG tablet Take 1 tablet (8 mg total) by mouth 2 (two) times daily as needed for refractory nausea / vomiting. Start on day 3 after chemotherapy. 08/23/19   Truitt Merle, MD  prochlorperazine (COMPAZINE) 10 MG tablet  Take 1 tablet (10 mg total) by mouth every 6 (six) hours as needed (Nausea or vomiting). 08/23/19   Truitt Merle, MD     Family History  Problem Relation Age of Onset  . Breast cancer Mother   . Aortic aneurysm Father     Social History   Socioeconomic History  . Marital status: Married    Spouse name: [name]  . Number of children: 1  . Years of education: some college   . Highest education level: Not on file  Occupational History  . Occupation: Press photographer    Comment: Previously Sports administrator   Tobacco Use  . Smoking status: Never Smoker  . Smokeless tobacco: Never Used  . Tobacco comment: Smoked in 44s  Substance and Sexual Activity  . Alcohol use: Yes    Alcohol/week: 24.0 standard drinks    Types: 12 Glasses of wine, 12 Cans of beer per week  . Drug use: Yes    Types: Opium  . Sexual activity: Not Currently    Partners: Female    Comment: not for last 3 weeks, no condom use  Other Topics Concern  . Not on file  Social History Narrative  . Not on file   Social Determinants of Health   Financial Resource Strain:   . Difficulty of Paying Living Expenses: Not on file  Food Insecurity:   . Worried About Charity fundraiser in the Last Year: Not on file  .  Ran Out of Food in the Last Year: Not on file  Transportation Needs:   . Lack of Transportation (Medical): Not on file  . Lack of Transportation (Non-Medical): Not on file  Physical Activity:   . Days of Exercise per Week: Not on file  . Minutes of Exercise per Session: Not on file  Stress:   . Feeling of Stress : Not on file  Social Connections:   . Frequency of Communication with Friends and Family: Not on file  . Frequency of Social Gatherings with Friends and Family: Not on file  . Attends Religious Services: Not on file  . Active Member of Clubs or Organizations: Not on file  . Attends Archivist Meetings: Not on file  . Marital Status: Not on file     Review of Systems: A 12 point ROS  discussed and pertinent positives are indicated in the HPI above.  All other systems are negative.  Review of Systems  Vital Signs: Pulse 79   Temp 98.3 F (36.8 C) (Skin)   Resp 18   Ht 5\' 10"  (1.778 m)   Wt 93 kg   SpO2 97%   BMI 29.41 kg/m   Physical Exam Vitals reviewed.  Constitutional:      Appearance: Normal appearance.  HENT:     Head: Normocephalic and atraumatic.  Eyes:     Extraocular Movements: Extraocular movements intact.  Cardiovascular:     Rate and Rhythm: Normal rate and regular rhythm.  Pulmonary:     Effort: Pulmonary effort is normal. No respiratory distress.     Breath sounds: Normal breath sounds.  Abdominal:     General: There is no distension.     Palpations: Abdomen is soft.     Tenderness: There is no abdominal tenderness.  Musculoskeletal:        General: Normal range of motion.     Cervical back: Normal range of motion.  Skin:    General: Skin is warm and dry.  Neurological:     General: No focal deficit present.     Mental Status: He is alert and oriented to person, place, and time.  Psychiatric:        Mood and Affect: Mood normal.        Behavior: Behavior normal.        Thought Content: Thought content normal.        Judgment: Judgment normal.     Imaging: MR PELVIS WO CONTRAST  Result Date: 08/02/2019 CLINICAL DATA:  Newly diagnosed rectal carcinoma.  Local staging. EXAM: MRI PELVIS WITHOUT CONTRAST TECHNIQUE: Multiplanar multisequence MR imaging of the pelvis was performed. No intravenous contrast was administered. Small amount of [place] gel was administered per rectum to optimize tumor evaluation. COMPARISON:  None. FINDINGS: TUMOR LOCATION Tumor distance from Anal Verge/Skin Surface:  5.0 cm Tumor distance to Internal Anal Sphincter: 0 cm TUMOR DESCRIPTION Circumferential Extent: Right anterior rectal wall, from 10-1 o'clock positions Tumor Length: 3.6 cm T - CATEGORY Extension through Muscularis Propria: Yes, measuring approximately  9 mm= T3c Shortest Distance of any tumor/node from Mesorectal Fascia: 7 mm from right lateral perirectal lymph node to adjacent mesorectal fascia (image 16/4) Extramural Vascular Invasion/Tumor Thrombus: No Invasion of Anterior Peritoneal Reflection: No Involvement of Adjacent Organs or Pelvic Sidewall: No Levator Ani Involvement: No N - CATEGORY Mesorectal Lymph Nodes >=47mm: 6 mm lymph node in right perirectal space on image 16/4, and 5 mm lymph node in left perirectal space =N1 Extra-mesorectal Lymphadenopathy: No  Other:  None. IMPRESSION: Rectal adenocarcinoma T stage: T3c Rectal adenocarcinoma N stage:  N1 Distance from tumor to the internal anal sphincter is 0 cm. Electronically Signed   By: Marlaine Hind M.D.   On: 08/02/2019 11:26    Labs:  CBC: Recent Labs    09/15/18 0840 08/23/19 1037  WBC 9.4 5.8  HGB 15.5 15.2  HCT 43.2 42.1  PLT 238 219    COAGS: No results for input(s): INR, APTT in the last 8760 hours.  BMP: Recent Labs    09/15/18 0840 08/23/19 1037  NA 139 140  K 3.6 4.4  CL 104 106  CO2 26 26  GLUCOSE 113* 98  BUN 12 14  CALCIUM 9.5 9.5  CREATININE 1.16 1.12    LIVER FUNCTION TESTS: Recent Labs    09/15/18 0840 08/23/19 1037  BILITOT 1.7* 0.8  AST 18 23  ALT 23 31  PROT 6.9 6.7    TUMOR MARKERS: No results for input(s): AFPTM, CEA, CA199, CHROMGRNA in the last 8760 hours.  Assessment and Plan:  Rectal cancer  Will proceed with placement of a Port A Cath today by Dr. Earleen Newport.  Risks and benefits of image guided port-a-catheter placement was discussed with the patient including, but not limited to bleeding, infection, pneumothorax, or fibrin sheath development and need for additional procedures.  All of the patient's questions were answered, patient is agreeable to proceed. Consent signed and in chart.  Thank you for this interesting consult.  I greatly enjoyed meeting Joshua Zhang and look forward to participating in their care.  A copy  of this report was sent to the requesting provider on this date.  Electronically Signed: Murrell Redden, PA-C   08/30/2019, 10:35 AM      I spent a total of  30 Minutes   in face to face in clinical consultation, greater than 50% of which was counseling/coordinating care for [facility] A Cath placement.

## 2019-08-30 NOTE — Discharge Instructions (Signed)
Moderate Conscious Sedation, Adult, Care After These instructions provide you with information about caring for yourself after your procedure. Your health care provider may also give you more specific instructions. Your treatment has been planned according to current medical practices, but problems sometimes occur. Call your health care provider if you have any problems or questions after your procedure. What can I expect after the procedure? After your procedure, it is common:  To feel sleepy for several hours.  To feel clumsy and have poor balance for several hours.  To have poor judgment for several hours.  To vomit if you eat too soon. Follow these instructions at home: For at least 24 hours after the procedure:   Do not: ? Participate in activities where you could fall or become injured. ? Drive. ? Use heavy machinery. ? Drink alcohol. ? Take sleeping pills or medicines that cause drowsiness. ? Make important decisions or sign legal documents. ? Take care of children on your own.  Rest. Eating and drinking  Follow the diet recommended by your health care provider.  If you vomit: ? Drink water, juice, or soup when you can drink without vomiting. ? Make sure you have little or no nausea before eating solid foods. General instructions  Have a responsible adult stay with you until you are awake and alert.  Take over-the-counter and prescription medicines only as told by your health care provider.  If you smoke, do not smoke without supervision.  Keep all follow-up visits as told by your health care provider. This is important. Contact a health care provider if:  You keep feeling nauseous or you keep vomiting.  You feel light-headed.  You develop a rash.  You have a fever. Get help right away if:  You have trouble breathing. This information is not intended to replace advice given to you by your health care provider. Make sure you discuss any questions you have  with your health care provider. Document Revised: 06/18/2017 Document Reviewed: 10/26/2015 Elsevier Patient Education  2020 [employer] Implanted Port Home Guide An implanted port is a device that is placed under the skin. It is usually placed in the chest. The device can be used to give IV medicine, to take blood, or for dialysis. You may have an implanted port if:  You need IV medicine that would be irritating to the small veins in your hands or arms.  You need IV medicines, such as antibiotics, for a long period of time.  You need IV nutrition for a long period of time.  You need dialysis. Having a port means that your health care provider will not need to use the veins in your arms for these procedures. You may have fewer limitations when using a port than you would if you used other types of long-term IVs, and you will likely be able to return to normal activities after your incision heals. An implanted port has two main parts:  Reservoir. The reservoir is the part where a needle is inserted to give medicines or draw blood. The reservoir is round. After it is placed, it appears as a small, raised area under your skin.  Catheter. The catheter is a thin, flexible tube that connects the reservoir to a vein. Medicine that is inserted into the reservoir goes into the catheter and then into the vein. How is my port accessed? To access your port:  A numbing cream may be placed on the skin over the port site.  Your health care provider   will put on a mask and sterile gloves.  The skin over your port will be cleaned carefully with a germ-killing soap and allowed to dry.  Your health care provider will gently pinch the port and insert a needle into it.  Your health care provider will check for a blood return to make sure the port is in the vein and is not clogged.  If your port needs to remain accessed to get medicine continuously (constant infusion), your health care provider will place  a clear bandage (dressing) over the needle site. The dressing and needle will need to be changed every week, or as told by your health care provider. What is flushing? Flushing helps keep the port from getting clogged. Follow instructions from your health care provider about how and when to flush the port. Ports are usually flushed with saline solution or a medicine called heparin. The need for flushing will depend on how the port is used:  If the port is only used from time to time to give medicines or draw blood, the port may need to be flushed: ? Before and after medicines have been given. ? Before and after blood has been drawn. ? As part of routine maintenance. Flushing may be recommended every 4-6 weeks.  If a constant infusion is running, the port may not need to be flushed.  Throw away any syringes in a disposal container that is meant for sharp items (sharps container). You can buy a sharps container from a pharmacy, or you can make one by using an empty hard plastic bottle with a cover. How long will my port stay implanted? The port can stay in for as long as your health care provider thinks it is needed. When it is time for the port to come out, a surgery will be done to remove it. The surgery will be similar to the procedure that was done to put the port in. Follow these instructions at home:   Flush your port as told by your health care provider.  If you need an infusion over several days, follow instructions from your health care provider about how to take care of your port site. Make sure you: ? Wash your hands with soap and water before you change your dressing. If soap and water are not available, use alcohol-based hand sanitizer. ? Change your dressing as told by your health care provider. ? Place any used dressings or infusion bags into a plastic bag. Throw that bag in the trash. ? Keep the dressing that covers the needle clean and dry. Do not get it wet. ? Do not use  scissors or sharp objects near the tube. ? Keep the tube clamped, unless it is being used.  Check your port site every day for signs of infection. Check for: ? Redness, swelling, or pain. ? Fluid or blood. ? Pus or a bad smell.  Protect the skin around the port site. ? Avoid wearing bra straps that rub or irritate the site. ? Protect the skin around your port from seat belts. Place a soft pad over your chest if needed.  Bathe or shower as told by your health care provider. The site may get wet as long as you are not actively receiving an infusion.  Return to your normal activities as told by your health care provider. Ask your health care provider what activities are safe for you.  Carry a medical alert card or wear a medical alert bracelet at all times. This will  let health care providers know that you have an implanted port in case of an emergency. Get help right away if:  You have redness, swelling, or pain at the port site.  You have fluid or blood coming from your port site.  You have pus or a bad smell coming from the port site.  You have a fever. Summary  Implanted ports are usually placed in the chest for long-term IV access.  Follow instructions from your health care provider about flushing the port and changing bandages (dressings).  Take care of the area around your port by avoiding clothing that puts pressure on the area, and by watching for signs of infection.  Protect the skin around your port from seat belts. Place a soft pad over your chest if needed.  Get help right away if you have a fever or you have redness, swelling, pain, drainage, or a bad smell at the port site. This information is not intended to replace advice given to you by your health care provider. Make sure you discuss any questions you have with your health care provider. Document Revised: 10/28/2018 Document Reviewed: 08/08/2016 Elsevier Patient Education  [place]  Insertion, Care After This sheet gives you information about how to care for yourself after your procedure. Your health care provider may also give you more specific instructions. If you have problems or questions, contact your health care provider. What can I expect after the procedure? After the procedure, it is common to have:  Discomfort at the port insertion site.  Bruising on the skin over the port. This should improve over 3-4 days. Follow these instructions at home: [facility] care  After your port is placed, you will get a manufacturer's information card. The card has information about your port. Keep this card with you at all times.  Take care of the port as told by your health care provider. Ask your health care provider if you or a family member can get training for taking care of the port at home. A home health care nurse may also take care of the port.  Make sure to remember what type of port you have. Incision care      Follow instructions from your health care provider about how to take care of your port insertion site. Make sure you: ? Wash your hands with soap and water before and after you change your bandage (dressing). If soap and water are not available, use hand sanitizer. ? Change your dressing as told by your health care provider. ? Leave stitches (sutures), skin glue, or adhesive strips in place. These skin closures may need to stay in place for 2 weeks or longer. If adhesive strip edges start to loosen and curl up, you may trim the loose edges. Do not remove adhesive strips completely unless your health care provider tells you to do that.  Check your port insertion site every day for signs of infection. Check for: ? Redness, swelling, or pain. ? Fluid or blood. ? Warmth. ? Pus or a bad smell. Activity  Return to your normal activities as told by your health care provider. Ask your health care provider what activities are safe for you.  Do not lift anything that  is heavier than 10 lb (4.5 kg), or the limit that you are told, until your health care provider says that it is safe. General instructions  Take over-the-counter and prescription medicines only as told by your health care provider.  Do not  take baths, swim, or use a hot tub until your health care provider approves. Ask your health care provider if you may take showers. You may only be allowed to take sponge baths.  Do not drive for 24 hours if you were given a sedative during your procedure.  Wear a medical alert bracelet in case of an emergency. This will tell any health care providers that you have a port.  Keep all follow-up visits as told by your health care provider. This is important. Contact a health care provider if:  You cannot flush your port with saline as directed, or you cannot draw blood from the port.  You have a fever or chills.  You have redness, swelling, or pain around your port insertion site.  You have fluid or blood coming from your port insertion site.  Your port insertion site feels warm to the touch.  You have pus or a bad smell coming from the port insertion site. Get help right away if:  You have chest pain or shortness of breath.  You have bleeding from your port that you cannot control. Summary  Take care of the port as told by your health care provider. Keep the manufacturer's information card with you at all times.  Change your dressing as told by your health care provider.  Contact a health care provider if you have a fever or chills or if you have redness, swelling, or pain around your port insertion site.  Keep all follow-up visits as told by your health care provider. This information is not intended to replace advice given to you by your health care provider. Make sure you discuss any questions you have with your health care provider. Document Revised: 02/01/2018 Document Reviewed: 02/01/2018 Elsevier Patient Education  [place].

## 2019-08-31 ENCOUNTER — Inpatient Hospital Stay: Payer: BC Managed Care – PPO

## 2019-08-31 ENCOUNTER — Encounter: Payer: Self-pay | Admitting: Hematology

## 2019-08-31 ENCOUNTER — Inpatient Hospital Stay: Payer: BC Managed Care – PPO | Admitting: Hematology

## 2019-08-31 ENCOUNTER — Other Ambulatory Visit: Payer: Self-pay

## 2019-08-31 VITALS — BP 166/100 | HR 87 | Temp 97.8°F | Resp 17 | Ht 70.0 in | Wt 211.6 lb

## 2019-08-31 DIAGNOSIS — C20 Malignant neoplasm of rectum: Secondary | ICD-10-CM | POA: Diagnosis not present

## 2019-08-31 DIAGNOSIS — R5383 Other fatigue: Secondary | ICD-10-CM | POA: Diagnosis not present

## 2019-08-31 DIAGNOSIS — D122 Benign neoplasm of ascending colon: Secondary | ICD-10-CM | POA: Diagnosis not present

## 2019-08-31 DIAGNOSIS — E785 Hyperlipidemia, unspecified: Secondary | ICD-10-CM | POA: Diagnosis not present

## 2019-08-31 DIAGNOSIS — M25519 Pain in unspecified shoulder: Secondary | ICD-10-CM | POA: Diagnosis not present

## 2019-08-31 DIAGNOSIS — K5730 Diverticulosis of large intestine without perforation or abscess without bleeding: Secondary | ICD-10-CM | POA: Diagnosis not present

## 2019-08-31 DIAGNOSIS — Z5189 Encounter for other specified aftercare: Secondary | ICD-10-CM | POA: Diagnosis not present

## 2019-08-31 DIAGNOSIS — I10 Essential (primary) hypertension: Secondary | ICD-10-CM | POA: Diagnosis not present

## 2019-08-31 DIAGNOSIS — K625 Hemorrhage of anus and rectum: Secondary | ICD-10-CM | POA: Diagnosis not present

## 2019-08-31 DIAGNOSIS — Z21 Asymptomatic human immunodeficiency virus [HIV] infection status: Secondary | ICD-10-CM | POA: Diagnosis not present

## 2019-08-31 DIAGNOSIS — D123 Benign neoplasm of transverse colon: Secondary | ICD-10-CM | POA: Diagnosis not present

## 2019-08-31 DIAGNOSIS — Z95828 Presence of other vascular implants and grafts: Secondary | ICD-10-CM

## 2019-08-31 DIAGNOSIS — D124 Benign neoplasm of descending colon: Secondary | ICD-10-CM | POA: Diagnosis not present

## 2019-08-31 DIAGNOSIS — D125 Benign neoplasm of sigmoid colon: Secondary | ICD-10-CM | POA: Diagnosis not present

## 2019-08-31 DIAGNOSIS — Z7289 Other problems related to lifestyle: Secondary | ICD-10-CM | POA: Diagnosis not present

## 2019-08-31 DIAGNOSIS — Z5111 Encounter for antineoplastic chemotherapy: Secondary | ICD-10-CM | POA: Diagnosis not present

## 2019-08-31 DIAGNOSIS — I700 Atherosclerosis of aorta: Secondary | ICD-10-CM | POA: Diagnosis not present

## 2019-08-31 LAB — CBC WITH DIFFERENTIAL (CANCER CENTER ONLY)
Abs Immature Granulocytes: 0.01 10*3/uL (ref 0.00–0.07)
Basophils Absolute: 0 10*3/uL (ref 0.0–0.1)
Basophils Relative: 0 %
Eosinophils Absolute: 0.2 10*3/uL (ref 0.0–0.5)
Eosinophils Relative: 4 %
HCT: 39.8 % (ref 39.0–52.0)
Hemoglobin: 14.8 g/dL (ref 13.0–17.0)
Immature Granulocytes: 0 %
Lymphocytes Relative: 23 %
Lymphs Abs: 1.3 10*3/uL (ref 0.7–4.0)
MCH: 34.3 pg — ABNORMAL HIGH (ref 26.0–34.0)
MCHC: 37.2 g/dL — ABNORMAL HIGH (ref 30.0–36.0)
MCV: 92.3 fL (ref 80.0–100.0)
Monocytes Absolute: 0.4 10*3/uL (ref 0.1–1.0)
Monocytes Relative: 8 %
Neutro Abs: 3.4 10*3/uL (ref 1.7–7.7)
Neutrophils Relative %: 65 %
Platelet Count: 199 10*3/uL (ref 150–400)
RBC: 4.31 MIL/uL (ref 4.22–5.81)
RDW: 11.5 % (ref 11.5–15.5)
WBC Count: 5.3 10*3/uL (ref 4.0–10.5)
nRBC: 0 % (ref 0.0–0.2)

## 2019-08-31 LAB — CMP (CANCER CENTER ONLY)
ALT: 34 U/L (ref 0–44)
AST: 25 U/L (ref 15–41)
Albumin: 3.7 g/dL (ref 3.5–5.0)
Alkaline Phosphatase: 91 U/L (ref 38–126)
Anion gap: 6 (ref 5–15)
BUN: 15 mg/dL (ref 6–20)
CO2: 24 mmol/L (ref 22–32)
Calcium: 8.4 mg/dL — ABNORMAL LOW (ref 8.9–10.3)
Chloride: 108 mmol/L (ref 98–111)
Creatinine: 0.96 mg/dL (ref 0.61–1.24)
GFR, Est AFR Am: >60 mL/min (ref >60)
GFR, Estimated: >60 mL/min (ref >60)
Glucose, Bld: 106 mg/dL — ABNORMAL HIGH (ref 70–99)
Potassium: 4 mmol/L (ref 3.5–5.1)
Sodium: 138 mmol/L (ref 135–145)
Total Bilirubin: 0.8 mg/dL (ref 0.3–1.2)
Total Protein: 6.7 g/dL (ref 6.5–8.1)

## 2019-08-31 MED ORDER — SODIUM CHLORIDE 0.9 % IV SOLN
2400.0000 mg/m2 | INTRAVENOUS | Status: DC
  Administered 2019-08-31: 5300 mg via INTRAVENOUS
  Filled 2019-08-31: qty 106

## 2019-08-31 MED ORDER — PALONOSETRON HCL INJECTION 0.25 MG/5ML
INTRAVENOUS | Status: AC
  Filled 2019-08-31: qty 5

## 2019-08-31 MED ORDER — SODIUM CHLORIDE 0.9% FLUSH
10.0000 mL | INTRAVENOUS | Status: DC | PRN
  Filled 2019-08-31: qty 10

## 2019-08-31 MED ORDER — DEXAMETHASONE SODIUM PHOSPHATE 10 MG/ML IJ SOLN
INTRAMUSCULAR | Status: AC
  Filled 2019-08-31: qty 1

## 2019-08-31 MED ORDER — DEXTROSE 5 % IV SOLN
Freq: Once | INTRAVENOUS | Status: AC
  Filled 2019-08-31: qty 250

## 2019-08-31 MED ORDER — SODIUM CHLORIDE 0.9% FLUSH
10.0000 mL | INTRAVENOUS | Status: DC | PRN
  Administered 2019-08-31: 10 mL via INTRAVENOUS
  Filled 2019-08-31: qty 10

## 2019-08-31 MED ORDER — LEUCOVORIN CALCIUM INJECTION 350 MG
400.0000 mg/m2 | Freq: Once | INTRAVENOUS | Status: AC
  Administered 2019-08-31: 880 mg via INTRAVENOUS
  Filled 2019-08-31: qty 44

## 2019-08-31 MED ORDER — SODIUM CHLORIDE 0.9 % IV SOLN: 10.0000 mg | Freq: Once | INTRAVENOUS | Status: DC

## 2019-08-31 MED ORDER — DEXAMETHASONE SODIUM PHOSPHATE 10 MG/ML IJ SOLN
10.0000 mg | Freq: Once | INTRAMUSCULAR | Status: AC
  Administered 2019-08-31: 10 mg via INTRAVENOUS

## 2019-08-31 MED ORDER — OXALIPLATIN CHEMO INJECTION 100 MG/20ML
85.0000 mg/m2 | Freq: Once | INTRAVENOUS | Status: AC
  Administered 2019-08-31: 185 mg via INTRAVENOUS
  Filled 2019-08-31: qty 37

## 2019-08-31 MED ORDER — PALONOSETRON HCL INJECTION 0.25 MG/5ML
0.2500 mg | Freq: Once | INTRAVENOUS | Status: AC
  Administered 2019-08-31: 0.25 mg via INTRAVENOUS

## 2019-08-31 MED ORDER — HEPARIN SOD (PORK) LOCK FLUSH 100 UNIT/ML IV SOLN
500.0000 [IU] | Freq: Once | INTRAVENOUS | Status: DC | PRN
  Filled 2019-08-31: qty 5

## 2019-08-31 NOTE — Patient Instructions (Signed)

## 2019-08-31 NOTE — Patient Instructions (Signed)
Oxaliplatin Injection What is this medicine? OXALIPLATIN (ox AL i PLA tin) is a chemotherapy drug. It targets fast dividing cells, like cancer cells, and causes these cells to die. This medicine is used to treat cancers of the colon and rectum, and many other cancers. This medicine may be used for other purposes; ask your health care provider or pharmacist if you have questions. COMMON BRAND NAME(S): Eloxatin What should I tell my health care provider before I take this medicine? They need to know if you have any of these conditions:  heart disease  history of irregular heartbeat  liver disease  low blood counts, like white cells, platelets, or red blood cells  lung or breathing disease, like asthma  take medicines that treat or prevent blood clots  tingling of the fingers or toes, or other nerve disorder  an unusual or allergic reaction to oxaliplatin, other chemotherapy, other medicines, foods, dyes, or preservatives  pregnant or trying to get pregnant  breast-feeding How should I use this medicine? This drug is given as an infusion into a vein. It is administered in a hospital or clinic by a specially trained health care professional. Talk to your pediatrician regarding the use of this medicine in children. Special care may be needed. Overdosage: If you think you have taken too much of this medicine contact a poison control center or emergency room at once. NOTE: This medicine is only for you. Do not share this medicine with others. What if I miss a dose? It is important not to miss a dose. Call your doctor or health care professional if you are unable to keep an appointment. What may interact with this medicine? Do not take this medicine with any of the following medications:  cisapride  dronedarone  pimozide  thioridazine This medicine may also interact with the following medications:  aspirin and aspirin-like medicines  certain medicines that treat or prevent  blood clots like warfarin, apixaban, dabigatran, and rivaroxaban  cisplatin  cyclosporine  diuretics  medicines for infection like acyclovir, adefovir, amphotericin B, bacitracin, cidofovir, foscarnet, ganciclovir, gentamicin, pentamidine, vancomycin  NSAIDs, medicines for pain and inflammation, like ibuprofen or naproxen  other medicines that prolong the QT interval (an abnormal heart rhythm)  pamidronate  zoledronic acid This list may not describe all possible interactions. Give your health care provider a list of all the medicines, herbs, non-prescription drugs, or dietary supplements you use. Also tell them if you smoke, drink alcohol, or use illegal drugs. Some items may interact with your medicine. What should I watch for while using this medicine? Your condition will be monitored carefully while you are receiving this medicine. You may need blood work done while you are taking this medicine. This medicine may make you feel generally unwell. This is not uncommon as chemotherapy can affect healthy cells as well as cancer cells. Report any side effects. Continue your course of treatment even though you feel ill unless your healthcare professional tells you to stop. This medicine can make you more sensitive to cold. Do not drink cold drinks or use ice. Cover exposed skin before coming in contact with cold temperatures or cold objects. When out in cold weather wear warm clothing and cover your mouth and nose to warm the air that goes into your lungs. Tell your doctor if you get sensitive to the cold. Do not become pregnant while taking this medicine or for 9 months after stopping it. Women should inform their health care professional if they wish to become   pregnant or think they might be pregnant. Men should not father a child while taking this medicine and for 6 months after stopping it. There is potential for serious side effects to an unborn child. Talk to your health care professional  for more information. Do not breast-feed a child while taking this medicine or for 3 months after stopping it. This medicine has caused ovarian failure in some women. This medicine may make it more difficult to get pregnant. Talk to your health care professional if you are concerned about your fertility. This medicine has caused decreased sperm counts in some men. This may make it more difficult to father a child. Talk to your health care professional if you are concerned about your fertility. This medicine may increase your risk of getting an infection. Call your health care professional for advice if you get a fever, chills, or sore throat, or other symptoms of a cold or flu. Do not treat yourself. Try to avoid being around people who are sick. Avoid taking medicines that contain aspirin, acetaminophen, ibuprofen, naproxen, or ketoprofen unless instructed by your health care professional. These medicines may hide a fever. Be careful brushing or flossing your teeth or using a toothpick because you may get an infection or bleed more easily. If you have any dental work done, tell your dentist you are receiving this medicine. What side effects may I notice from receiving this medicine? Side effects that you should report to your doctor or health care professional as soon as possible:  allergic reactions like skin rash, itching or hives, swelling of the face, lips, or tongue  breathing problems  cough  low blood counts - this medicine may decrease the number of white blood cells, red blood cells, and platelets. You may be at increased risk for infections and bleeding  nausea, vomiting  pain, redness, or irritation at site where injected  pain, tingling, numbness in the hands or feet  signs and symptoms of bleeding such as bloody or black, tarry stools; red or dark brown urine; spitting up blood or brown material that looks like coffee grounds; red spots on the skin; unusual bruising or bleeding  from the eyes, gums, or nose  signs and symptoms of a dangerous change in heartbeat or heart rhythm like chest pain; dizziness; fast, irregular heartbeat; palpitations; feeling faint or lightheaded; falls  signs and symptoms of infection like fever; chills; cough; sore throat; pain or trouble passing urine  signs and symptoms of liver injury like dark yellow or brown urine; general ill feeling or flu-like symptoms; light-colored stools; loss of appetite; nausea; right upper belly pain; unusually weak or tired; yellowing of the eyes or skin  signs and symptoms of low red blood cells or anemia such as unusually weak or tired; feeling faint or lightheaded; falls  signs and symptoms of muscle injury like dark urine; trouble passing urine or change in the amount of urine; unusually weak or tired; muscle pain; back pain Side effects that usually do not require medical attention (report to your doctor or health care professional if they continue or are bothersome):  changes in taste  diarrhea  gas  hair loss  loss of appetite  mouth sores This list may not describe all possible side effects. Call your doctor for medical advice about side effects. You may report side effects to FDA at 1-800-FDA-1088. Where should I keep my medicine? This drug is given in a hospital or clinic and will not be stored at home. NOTE:   This sheet is a summary. It may not cover all possible information. If you have questions about this medicine, talk to your doctor, pharmacist, or health care provider.  2020 Elsevier/Gold Standard (2018-11-23 12:20:35)  Leucovorin injection What is this medicine? LEUCOVORIN (loo koe VOR in) is used to prevent or treat the harmful effects of some medicines. This medicine is used to treat anemia caused by a low amount of folic acid in the body. It is also used with 5-fluorouracil (5-FU) to treat colon cancer. This medicine may be used for other purposes; ask your health care provider  or pharmacist if you have questions. What should I tell my health care provider before I take this medicine? They need to know if you have any of these conditions:  anemia from low levels of vitamin B-12 in the blood  an unusual or allergic reaction to leucovorin, folic acid, other medicines, foods, dyes, or preservatives  pregnant or trying to get pregnant  breast-feeding How should I use this medicine? This medicine is for injection into a muscle or into a vein. It is given by a health care professional in a hospital or clinic setting. Talk to your pediatrician regarding the use of this medicine in children. Special care may be needed. Overdosage: If you think you have taken too much of this medicine contact a poison control center or emergency room at once. NOTE: This medicine is only for you. Do not share this medicine with others. What if I miss a dose? This does not apply. What may interact with this medicine?  capecitabine  fluorouracil  phenobarbital  phenytoin  primidone  trimethoprim-sulfamethoxazole This list may not describe all possible interactions. Give your health care provider a list of all the medicines, herbs, non-prescription drugs, or dietary supplements you use. Also tell them if you smoke, drink alcohol, or use illegal drugs. Some items may interact with your medicine. What should I watch for while using this medicine? Your condition will be monitored carefully while you are receiving this medicine. This medicine may increase the side effects of 5-fluorouracil, 5-FU. Tell your doctor or health care professional if you have diarrhea or mouth sores that do not get better or that get worse. What side effects may I notice from receiving this medicine? Side effects that you should report to your doctor or health care professional as soon as possible:  allergic reactions like skin rash, itching or hives, swelling of the face, lips, or tongue  breathing  problems  fever, infection  mouth sores  unusual bleeding or bruising  unusually weak or tired Side effects that usually do not require medical attention (report to your doctor or health care professional if they continue or are bothersome):  constipation or diarrhea  loss of appetite  nausea, vomiting This list may not describe all possible side effects. Call your doctor for medical advice about side effects. You may report side effects to FDA at 1-800-FDA-1088. Where should I keep my medicine? This drug is given in a hospital or clinic and will not be stored at home. NOTE: This sheet is a summary. It may not cover all possible information. If you have questions about this medicine, talk to your doctor, pharmacist, or health care provider.  2020 Elsevier/Gold Standard (2008-01-10 16:50:29)  Fluorouracil, 5-FU injection What is this medicine? FLUOROURACIL, 5-FU (flure oh YOOR a sil) is a chemotherapy drug. It slows the growth of cancer cells. This medicine is used to treat many types of cancer like breast cancer,   colon or rectal cancer, pancreatic cancer, and stomach cancer. This medicine may be used for other purposes; ask your health care provider or pharmacist if you have questions. COMMON BRAND NAME(S): Adrucil What should I tell my health care provider before I take this medicine? They need to know if you have any of these conditions:  blood disorders  dihydropyrimidine dehydrogenase (DPD) deficiency  infection (especially a virus infection such as chickenpox, cold sores, or herpes)  kidney disease  liver disease  malnourished, poor nutrition  recent or ongoing radiation therapy  an unusual or allergic reaction to fluorouracil, other chemotherapy, other medicines, foods, dyes, or preservatives  pregnant or trying to get pregnant  breast-feeding How should I use this medicine? This drug is given as an infusion or injection into a vein. It is administered in a  hospital or clinic by a specially trained health care professional. Talk to your pediatrician regarding the use of this medicine in children. Special care may be needed. Overdosage: If you think you have taken too much of this medicine contact a poison control center or emergency room at once. NOTE: This medicine is only for you. Do not share this medicine with others. What if I miss a dose? It is important not to miss your dose. Call your doctor or health care professional if you are unable to keep an appointment. What may interact with this medicine?  allopurinol  cimetidine  dapsone  digoxin  hydroxyurea  leucovorin  levamisole  medicines for seizures like ethotoin, fosphenytoin, phenytoin  medicines to increase blood counts like filgrastim, pegfilgrastim, sargramostim  medicines that treat or prevent blood clots like warfarin, enoxaparin, and dalteparin  methotrexate  metronidazole  pyrimethamine  some other chemotherapy drugs like busulfan, cisplatin, estramustine, vinblastine  trimethoprim  trimetrexate  vaccines Talk to your doctor or health care professional before taking any of these medicines:  acetaminophen  aspirin  ibuprofen  ketoprofen  naproxen This list may not describe all possible interactions. Give your health care provider a list of all the medicines, herbs, non-prescription drugs, or dietary supplements you use. Also tell them if you smoke, drink alcohol, or use illegal drugs. Some items may interact with your medicine. What should I watch for while using this medicine? Visit your doctor for checks on your progress. This drug may make you feel generally unwell. This is not uncommon, as chemotherapy can affect healthy cells as well as cancer cells. Report any side effects. Continue your course of treatment even though you feel ill unless your doctor tells you to stop. In some cases, you may be given additional medicines to help with side  effects. Follow all directions for their use. Call your doctor or health care professional for advice if you get a fever, chills or sore throat, or other symptoms of a cold or flu. Do not treat yourself. This drug decreases your body's ability to fight infections. Try to avoid being around people who are sick. This medicine may increase your risk to bruise or bleed. Call your doctor or health care professional if you notice any unusual bleeding. Be careful brushing and flossing your teeth or using a toothpick because you may get an infection or bleed more easily. If you have any dental work done, tell your dentist you are receiving this medicine. Avoid taking products that contain aspirin, acetaminophen, ibuprofen, naproxen, or ketoprofen unless instructed by your doctor. These medicines may hide a fever. Do not become pregnant while taking this medicine. Women should inform their   doctor if they wish to become pregnant or think they might be pregnant. There is a potential for serious side effects to an unborn child. Talk to your health care professional or pharmacist for more information. Do not breast-feed an infant while taking this medicine. Men should inform their doctor if they wish to father a child. This medicine may lower sperm counts. Do not treat diarrhea with over the counter products. Contact your doctor if you have diarrhea that lasts more than 2 days or if it is severe and watery. This medicine can make you more sensitive to the sun. Keep out of the sun. If you cannot avoid being in the sun, wear protective clothing and use sunscreen. Do not use sun lamps or tanning beds/booths. What side effects may I notice from receiving this medicine? Side effects that you should report to your doctor or health care professional as soon as possible:  allergic reactions like skin rash, itching or hives, swelling of the face, lips, or tongue  low blood counts - this medicine may decrease the number of  white blood cells, red blood cells and platelets. You may be at increased risk for infections and bleeding.  signs of infection - fever or chills, cough, sore throat, pain or difficulty passing urine  signs of decreased platelets or bleeding - bruising, pinpoint red spots on the skin, black, tarry stools, blood in the urine  signs of decreased red blood cells - unusually weak or tired, fainting spells, lightheadedness  breathing problems  changes in vision  chest pain  mouth sores  nausea and vomiting  pain, swelling, redness at site where injected  pain, tingling, numbness in the hands or feet  redness, swelling, or sores on hands or feet  stomach pain  unusual bleeding Side effects that usually do not require medical attention (report to your doctor or health care professional if they continue or are bothersome):  changes in finger or toe nails  diarrhea  dry or itchy skin  hair loss  headache  loss of appetite  sensitivity of eyes to the light  stomach upset  unusually teary eyes This list may not describe all possible side effects. Call your doctor for medical advice about side effects. You may report side effects to FDA at 1-800-FDA-1088. Where should I keep my medicine? This drug is given in a hospital or clinic and will not be stored at home. NOTE: This sheet is a summary. It may not cover all possible information. If you have questions about this medicine, talk to your doctor, pharmacist, or health care provider.  2020 Elsevier/Gold Standard (2007-11-09 13:53:16)  

## 2019-09-01 ENCOUNTER — Encounter: Payer: Self-pay | Admitting: Licensed Clinical Social Worker

## 2019-09-01 ENCOUNTER — Telehealth: Payer: Self-pay | Admitting: Hematology

## 2019-09-01 NOTE — Telephone Encounter (Signed)
Scheduled appt per 2/11 los.  Left a vm of the appt date and time. 

## 2019-09-01 NOTE — Telephone Encounter (Signed)
Left message at both listed ph #'s to call back to let us know how he did with his treatment yest.

## 2019-09-01 NOTE — Telephone Encounter (Signed)
-----   Message from Manuella Ghazi, RN sent at 08/31/2019  1:17 PM EST ----- Regarding: Feng-first time follow up Please follow up with patient. First time folfox 08/31/19

## 2019-09-01 NOTE — Progress Notes (Signed)
[place] Psychosocial Distress Screening Clinical Social Work  Clinical Social Work was referred by distress screening protocol.  The patient scored a 7 on the Psychosocial Distress Thermometer which indicates moderate distress. Clinical Social Worker attempted to contact patient by phone and left a generic VM.  Called second number on file and reached wife, [name], to assess for distress and other psychosocial needs. Per wife, they are both doing "okay" right now. Feeling some stress but getting through it. Per [name], patient is doing well today, experiencing minimal side effects. CSW described support services and encouraged call back if patient or family would like further support.   ONCBCN DISTRESS SCREENING 08/22/2019  Screening Type Initial Screening  Distress experienced in past week (1-10) 7  Emotional problem type Depression;Nervousness/Anxiety;Adjusting to illness  Information Concerns Type Lack of info about diagnosis;Lack of info about treatment  Other Contact via phone    Clinical Social Worker follow up needed: No.    Brezlyn Manrique E, LCSW

## 2019-09-02 ENCOUNTER — Inpatient Hospital Stay: Payer: BC Managed Care – PPO

## 2019-09-02 VITALS — BP 161/94 | HR 70 | Temp 98.7°F | Resp 18 | Ht 70.0 in

## 2019-09-02 DIAGNOSIS — Z5189 Encounter for other specified aftercare: Secondary | ICD-10-CM | POA: Diagnosis not present

## 2019-09-02 DIAGNOSIS — K625 Hemorrhage of anus and rectum: Secondary | ICD-10-CM | POA: Diagnosis not present

## 2019-09-02 DIAGNOSIS — C20 Malignant neoplasm of rectum: Secondary | ICD-10-CM

## 2019-09-02 DIAGNOSIS — R5383 Other fatigue: Secondary | ICD-10-CM | POA: Diagnosis not present

## 2019-09-02 DIAGNOSIS — D122 Benign neoplasm of ascending colon: Secondary | ICD-10-CM | POA: Diagnosis not present

## 2019-09-02 DIAGNOSIS — K5730 Diverticulosis of large intestine without perforation or abscess without bleeding: Secondary | ICD-10-CM | POA: Diagnosis not present

## 2019-09-02 DIAGNOSIS — Z5111 Encounter for antineoplastic chemotherapy: Secondary | ICD-10-CM | POA: Diagnosis not present

## 2019-09-02 DIAGNOSIS — D123 Benign neoplasm of transverse colon: Secondary | ICD-10-CM | POA: Diagnosis not present

## 2019-09-02 DIAGNOSIS — E785 Hyperlipidemia, unspecified: Secondary | ICD-10-CM | POA: Diagnosis not present

## 2019-09-02 DIAGNOSIS — Z21 Asymptomatic human immunodeficiency virus [HIV] infection status: Secondary | ICD-10-CM | POA: Diagnosis not present

## 2019-09-02 DIAGNOSIS — D124 Benign neoplasm of descending colon: Secondary | ICD-10-CM | POA: Diagnosis not present

## 2019-09-02 DIAGNOSIS — I10 Essential (primary) hypertension: Secondary | ICD-10-CM | POA: Diagnosis not present

## 2019-09-02 DIAGNOSIS — D125 Benign neoplasm of sigmoid colon: Secondary | ICD-10-CM | POA: Diagnosis not present

## 2019-09-02 DIAGNOSIS — M25519 Pain in unspecified shoulder: Secondary | ICD-10-CM | POA: Diagnosis not present

## 2019-09-02 DIAGNOSIS — Z7289 Other problems related to lifestyle: Secondary | ICD-10-CM | POA: Diagnosis not present

## 2019-09-02 DIAGNOSIS — I700 Atherosclerosis of aorta: Secondary | ICD-10-CM | POA: Diagnosis not present

## 2019-09-02 MED ORDER — PEGFILGRASTIM-CBQV 6 MG/0.6ML ~~LOC~~ SOSY
6.0000 mg | PREFILLED_SYRINGE | Freq: Once | SUBCUTANEOUS | Status: AC
  Administered 2019-09-02: 6 mg via SUBCUTANEOUS

## 2019-09-02 MED ORDER — HEPARIN SOD (PORK) LOCK FLUSH 100 UNIT/ML IV SOLN
500.0000 [IU] | Freq: Once | INTRAVENOUS | Status: AC | PRN
  Administered 2019-09-02: 500 [IU]
  Filled 2019-09-02: qty 5

## 2019-09-02 MED ORDER — SODIUM CHLORIDE 0.9% FLUSH
10.0000 mL | INTRAVENOUS | Status: DC | PRN
  Administered 2019-09-02: 10 mL
  Filled 2019-09-02: qty 10

## 2019-09-02 NOTE — Patient Instructions (Signed)

## 2019-09-07 ENCOUNTER — Ambulatory Visit (INDEPENDENT_AMBULATORY_CARE_PROVIDER_SITE_OTHER): Payer: BC Managed Care – PPO | Admitting: Internal Medicine

## 2019-09-07 DIAGNOSIS — B20 Human immunodeficiency virus [HIV] disease: Secondary | ICD-10-CM | POA: Diagnosis not present

## 2019-09-11 NOTE — Progress Notes (Signed)
Virtual Visit via Telephone Note  I connected with Joshua Zhang on 09/11/19 at  9:00 AM EST by telephone and verified that I am speaking with the correct person using two identifiers.  Location: Patient: Home Provider: [facility]   I discussed the limitations, risks, security and privacy concerns of performing an evaluation and management service by telephone and the availability of in person appointments. I also discussed with the patient that there may be a patient responsible charge related to this service. The patient expressed understanding and agreed to proceed.   History of Present Illness: I called and spoke with Mardene Celeste and his wife, [name].  Apolonio Schneiders has not had any problems obtaining taking or tolerating his Biktarvy and does not recall missing doses.  Her was recently diagnosed with anal cancer.  He started FOLFOX chemotherapy last week.  He completed they are worried that the chemotherapy may cause his HIV infection to go out of control.   Observations/Objective: HIV 1 RNA Quant (copies/mL)  Date Value  08/23/2019 22 (H)  09/15/2018 21 (H)  09/15/2017 <20 NOT DETECTED   CD4 T Cell Abs (/uL)  Date Value  08/23/2019 562  09/15/2018 350 (L)  09/15/2017 560   HIV 1 RNA Quant (copies/mL)  Date Value  08/23/2019 22 (H)  09/15/2018 21 (H)  09/15/2017 <20 NOT DETECTED   CD4 T Cell Abs (/uL)  Date Value  08/23/2019 562  09/15/2018 350 (L)  09/15/2017 560    Assessment and Plan: He continues to have excellent, long-term control of his HIV infection.  Letter know that chemotherapy may cause his CD4 count to decline but should not impact his viral suppression as long as he continues to take [employer].  Follow Up Instructions: Continue Biktarvy Repeat CD4 and viral load in 1 month Follow-up here in 3 months   I discussed the assessment and treatment plan with the patient. The patient was provided an opportunity to ask questions and all were answered. The patient agreed  with the plan and demonstrated an understanding of the instructions.   The patient was advised to call back or seek an in-person evaluation if the symptoms worsen or if the condition fails to improve as anticipated.  I provided 14 minutes of non-face-to-face time during this encounter.   Michel Bickers, MD

## 2019-09-13 ENCOUNTER — Ambulatory Visit: Payer: BC Managed Care – PPO | Admitting: Nurse Practitioner

## 2019-09-13 ENCOUNTER — Other Ambulatory Visit: Payer: BC Managed Care – PPO

## 2019-09-13 ENCOUNTER — Ambulatory Visit: Payer: BC Managed Care – PPO

## 2019-09-13 NOTE — Progress Notes (Signed)
[place]   [phone]) [phone] Fax:([phone]   Clinic Follow up Note   Patient Care Team: Joshua Zhang as PCP - General (Physician Assistant) Joshua Bickers, MD as Consulting Physician (Infectious Diseases) Joshua Ruff, MD as Consulting Physician (General Surgery) Joshua Merle, MD as Consulting Physician (Hematology) Joshua Rudd, MD as Consulting Physician (Radiation Oncology) Joshua Finner, RN as Oncology Nurse Navigator 09/14/2019  CHIEF COMPLAINT: F/u rectal cancer   SUMMARY OF ONCOLOGIC HISTORY: Oncology History Overview Note  Cancer Staging Rectal adenocarcinoma [facility]) Staging form: Colon and Rectum, AJCC 8th Edition - Clinical stage from 08/21/2019: Stage IIIB (cT3, cN1, cM0) - Signed by Joshua Merle, MD on 08/21/2019    Rectal adenocarcinoma ([place])  07/03/2019 Procedure   Colonoscopy by Dr. Collene Zhang  IMPRESSION -Likely Malignant 5cm, circumferential, bleeding tumor in the rectum, 304 cm from the anal verge -biopsy done  -One 8 mm sessile polyp in the distal sigmoid colon, removed with a hot snare x2; resected and retrieved.  -Two small sessile polyps, 1 in the mid-descending colon and 1 in the mid transverse colon - removed by cold biopsies.  -One 18m sessile polyp in the mid transverse colon, removed with a hot snare x1; resected and retrieved.  -One 822msessile polyp in the proximal ascending colon, removed with a hot snare X1; resected and retrieved.  -few large scattered diverticula.   07/03/2019 Initial Biopsy   FINAL DIAGNOSIS 07/03/19  A. Colon, Descending, Polyp, Polypectomy:   -TUBULAR ADENOMA   -No high grade dysplasia or malignancy.  B. Colon, transverse, polyp, polypectomy:   -TUBULAR ADENOMA  -No high grade dysplasia or malignancy. C. Colon, Ascending, Polyp, Polypectomy:   -Fragments of sessile serrated adenoma/polyp D. Colon, sigmoid, polyp, polypectomy:   -Fragments of Tubular Adenoma  -No high grade dysplasia or  malignancy. E. Rectum, Mass, Biopsy:   -INVASIVE COLONIC ADENOCARCINOMA, MODERATELY-DIFFERENTIATED, see comment     07/12/2019 Imaging   CT AP W Contrast 07/12/19  IMPRESSION: 1. No evidence of metastatic disease within the chest, abdomen or pelvis. 2. Possible mild wall thickening in the sigmoid colon, although no focal colonic mass is identified. There are few distal colonic diverticula. 3. Sequela of prior granulomatous disease. 4. Possible cholelithiasis with mild wall thickening of the gallbladder fundus. 5. Aortic Atherosclerosis (ICD10-I70.0).   08/21/2019 Initial Diagnosis   [place]  08/21/2019 Cancer Staging   Staging form: Colon and Rectum, AJCC 8th Edition - Clinical stage from 08/21/2019: Stage IIIB (cT3, cN1, cM0) - Signed by FeTruitt MerleMD on 08/21/2019   08/31/2019 -  Chemotherapy   FOLFOX q2weeks for 8 cycles starting  08/31/19     CURRENT THERAPY:  Total neoadjuvant chemo starting with FOLFOX q2 weeks on 08/31/19  INTERVAL HISTORY: Mr. ViFeinsteineturns for f/u and treatment as scheduled. He completed cycle 1 FOLFOX with Udenyca on 08/31/19. He feels well today. Had mild fatigue, cold sensitivity, and shoulder pain for 2 days after pump d/c and injection. Pain resolved with massage. No residual neuropathy. Baseline loose stools are unchanged, has BM at least once daily. Denies recent rectal bleeding or pain. Mouth was sore for 1 day then resolved, did not limit po intake. No loss of appetite, he feels this is his normal weight, before chemo he was "stress eating." Denies fever, chills, cough, chest pain, dyspnea, leg swelling, or n/v.    MEDICAL HISTORY:  Past Medical History:  Diagnosis Date  . Hypertension     SURGICAL HISTORY: Past  Surgical History:  Procedure Laterality Date  . APPENDECTOMY     49 years old   . IR IMAGING GUIDED PORT INSERTION  08/30/2019  . SHOULDER SURGERY Bilateral 49 years old    I have reviewed the social history  and family history with the patient and they are unchanged from previous note.  ALLERGIES:  has No Known Allergies.  MEDICATIONS:  Current Outpatient Medications  Medication Sig Dispense Refill  . amLODipine (NORVASC) 5 MG tablet Take by mouth.    Marland Kitchen atorvastatin (LIPITOR) 10 MG tablet Take by mouth.    Marland Kitchen BIKTARVY 50-200-25 MG TABS tablet TAKE 1 TABLET BY MOUTH ONCE DAILY 30 tablet 5  . lidocaine-prilocaine (EMLA) cream Apply to affected area once 30 g 3  . losartan (COZAAR) 25 MG tablet Take by mouth.    . Multiple Vitamin (MULTIVITAMIN) tablet Take 1 tablet by mouth daily.    . ondansetron (ZOFRAN) 8 MG tablet Take 1 tablet (8 mg total) by mouth 2 (two) times daily as needed for refractory nausea / vomiting. Start on day 3 after chemotherapy. 30 tablet 1  . prochlorperazine (COMPAZINE) 10 MG tablet Take 1 tablet (10 mg total) by mouth every 6 (six) hours as needed (Nausea or vomiting). 30 tablet 1   No current facility-administered medications for this visit.   Facility-Administered Medications Ordered in Other Visits  Medication Dose Route Frequency Provider Last Rate Last Admin  . fluorouracil (ADRUCIL) 5,300 mg in sodium chloride 0.9 % 144 mL chemo infusion  2,400 mg/m2 (Treatment Plan Recorded) Intravenous 1 day or 1 dose Joshua Merle, MD      . fluorouracil (ADRUCIL) chemo injection 900 mg  400 mg/m2 (Treatment Plan Recorded) Intravenous Once Joshua Merle, MD      . heparin lock flush 100 unit/mL  500 Units Intracatheter Once PRN Joshua Merle, MD      . leucovorin 880 mg in dextrose 5 % 250 mL infusion  400 mg/m2 (Treatment Plan Recorded) Intravenous Once Joshua Merle, MD 147 mL/hr at 09/14/19 0954 880 mg at 09/14/19 0954  . oxaliplatin (ELOXATIN) 185 mg in dextrose 5 % 500 mL chemo infusion  85 mg/m2 (Treatment Plan Recorded) Intravenous Once Joshua Merle, MD 269 mL/hr at 09/14/19 0951 185 mg at 09/14/19 0951  . sodium chloride flush (NS) 0.9 % injection 10 mL  10 mL Intracatheter PRN Joshua Merle, MD         PHYSICAL EXAMINATION: ECOG PERFORMANCE STATUS: 1 - Symptomatic but completely ambulatory  Vitals:   09/14/19 0818  BP: (!) 141/88  Pulse: 67  Resp: 17  Temp: 97.8 F (36.6 C)  SpO2: 100%   Filed Weights   09/14/19 0818  Weight: 206 lb 11.2 oz (93.8 kg)    GENERAL:alert, no distress and comfortable SKIN: no rash  EYES:  sclera clear LUNGS: clear with normal breathing effort HEART: regular rate & rhythm, no lower extremity edema ABDOMEN: abdomen soft, non-tender and normal bowel sounds NEURO: alert & oriented x 3 with fluent speech, normal gait PAC without erythema, healing   LABORATORY DATA:  I have reviewed the data as listed CBC Latest Ref Rng & Units 09/14/2019 08/31/2019 08/23/2019  WBC 4.0 - 10.5 K/uL 6.7 5.3 5.8  Hemoglobin 13.0 - 17.0 g/dL 14.4 14.8 15.2  Hematocrit 39.0 - 52.0 % 39.5 39.8 42.1  Platelets 150 - 400 K/uL 211 199 219     CMP Latest Ref Rng & Units 09/14/2019 08/31/2019 08/23/2019  Glucose 70 - 99 mg/dL 131(H) 106(H)  98  BUN 6 - 20 mg/dL _0 Creatinine 0.61 - 1.24 mg/dL 1.12 0.96 1.12  Sodium 135 - 145 mmol/L 141 138 140  Potassium 3.5 - 5.1 mmol/L 3.9 4.0 4.4  Chloride 98 - 111 mmol/L 107 108 106  CO2 22 - 32 mmol/L _1 Calcium 8.9 - 10.3 mg/dL 9.0 8.4(L) 9.5  Total Protein 6.5 - 8.1 g/dL 6.8 6.7 6.7  Total Bilirubin 0.3 - 1.2 mg/dL 0.6 0.8 0.8  Alkaline Phos 38 - 126 U/L 116 91 -  AST 15 - 41 U/L 37 25 23  ALT 0 - 44 U/L 56(H) 34 31      RADIOGRAPHIC STUDIES: I have personally reviewed the radiological images as listed and agreed with the findings in the report. No results found.   ASSESSMENT & PLAN: Joshua Zhang is a 49 y.o. male with    1.Low rectaladenocarcinoma,cT3N1M0,Stage IIIB, MMR normal  -He presented with intermittent rectal bleeding for 2 years, but otherwise asymptomatic, this was discovered on screening colonoscopy in 06/2019.  -Colonoscopy and scan shows he has low rectal mass which is close to  sphincter. Biopsy from screening colonoscopy showed this is moderately-differentiated adenocarcinoma locally advanced. MMR is normal, not lynch syndrome.  -Based on MRI, this is cT3N1 stage III disease. Dr. Burr Medico recommended total neoadjuvant chemoRT -He was seen bycolorectal surgeon Dr. Dorita Fray agrees with neoadjuvant chemoRTand has consulted with Dr Lisbeth Renshaw.  -He began FOLFOX on 08/31/2019, 5FU bolus was omitted for cycle 1 due to his HIV. He tolerated well with mild fatigue, cold sensitivity, and bone pain. He recovered after 2-4 days.  -CBC and CMP are stable, adequate for treatment. He will proceed with cycle 2 today. Due to his good tolerance with cycle 1 we are adding 5FU bolus today.  -will monitor closely -he will return for lab and f/u with cycle 3 in 2 weeks.  -He knows to call if he develops mucositis, diarrhea, severe fatigue, fever, chills, or new pain.   2. Rectal bleeding, secondary to #1, without anemia  -Has been intermittent and stable for the last 2 years.  -He has had loose stool once daily lately, unchanged after cycle 1 chemo   3. HIV, controlled -He denies h/o or current use of recreational or IV drugs. He is unsure how he initially got infected.  -Controlled and undetectable. 09/15/18 CD4 was 350. His 08/23/19 CD4 is 562 -He is on Biktavy, he is compliant. Followed by Dr. Megan Salon   4. HLD, HTN -followed by PCP -on amlodipine, losartan, and atorvastatin -usually takes BP med in the afternoon -monitoring    PLAN: -Labs reviewed -Proceed with cycle 2 FOLFOX, add 5FU bolus -reviewed s/sx to report such as mucositis, diarrhea, fever, chills, severe fatigue, dehydration, etc.  -F/u and cycle 3 in 2 weeks   No problem-specific Assessment & Plan notes found for this encounter.   No orders of the defined types were placed in this encounter.  All questions were answered. The patient knows to call the clinic with any problems, questions or concerns. No  barriers to learning was detected.     Alla Feeling, NP 09/14/19

## 2019-09-14 ENCOUNTER — Inpatient Hospital Stay: Payer: BC Managed Care – PPO

## 2019-09-14 ENCOUNTER — Encounter: Payer: Self-pay | Admitting: Nurse Practitioner

## 2019-09-14 ENCOUNTER — Inpatient Hospital Stay (HOSPITAL_BASED_OUTPATIENT_CLINIC_OR_DEPARTMENT_OTHER): Payer: BC Managed Care – PPO | Admitting: Nurse Practitioner

## 2019-09-14 ENCOUNTER — Other Ambulatory Visit: Payer: Self-pay

## 2019-09-14 ENCOUNTER — Telehealth: Payer: Self-pay | Admitting: Nurse Practitioner

## 2019-09-14 VITALS — BP 141/88 | HR 67 | Temp 97.8°F | Resp 17 | Ht 70.0 in | Wt 206.7 lb

## 2019-09-14 DIAGNOSIS — Z7289 Other problems related to lifestyle: Secondary | ICD-10-CM | POA: Diagnosis not present

## 2019-09-14 DIAGNOSIS — K5730 Diverticulosis of large intestine without perforation or abscess without bleeding: Secondary | ICD-10-CM | POA: Diagnosis not present

## 2019-09-14 DIAGNOSIS — Z5189 Encounter for other specified aftercare: Secondary | ICD-10-CM | POA: Diagnosis not present

## 2019-09-14 DIAGNOSIS — D124 Benign neoplasm of descending colon: Secondary | ICD-10-CM | POA: Diagnosis not present

## 2019-09-14 DIAGNOSIS — Z95828 Presence of other vascular implants and grafts: Secondary | ICD-10-CM

## 2019-09-14 DIAGNOSIS — R5383 Other fatigue: Secondary | ICD-10-CM | POA: Diagnosis not present

## 2019-09-14 DIAGNOSIS — C20 Malignant neoplasm of rectum: Secondary | ICD-10-CM

## 2019-09-14 DIAGNOSIS — Z5111 Encounter for antineoplastic chemotherapy: Secondary | ICD-10-CM | POA: Diagnosis not present

## 2019-09-14 DIAGNOSIS — I700 Atherosclerosis of aorta: Secondary | ICD-10-CM | POA: Diagnosis not present

## 2019-09-14 DIAGNOSIS — E785 Hyperlipidemia, unspecified: Secondary | ICD-10-CM | POA: Diagnosis not present

## 2019-09-14 DIAGNOSIS — K625 Hemorrhage of anus and rectum: Secondary | ICD-10-CM | POA: Diagnosis not present

## 2019-09-14 DIAGNOSIS — I10 Essential (primary) hypertension: Secondary | ICD-10-CM | POA: Diagnosis not present

## 2019-09-14 DIAGNOSIS — Z21 Asymptomatic human immunodeficiency virus [HIV] infection status: Secondary | ICD-10-CM | POA: Diagnosis not present

## 2019-09-14 DIAGNOSIS — D122 Benign neoplasm of ascending colon: Secondary | ICD-10-CM | POA: Diagnosis not present

## 2019-09-14 DIAGNOSIS — D125 Benign neoplasm of sigmoid colon: Secondary | ICD-10-CM | POA: Diagnosis not present

## 2019-09-14 DIAGNOSIS — M25519 Pain in unspecified shoulder: Secondary | ICD-10-CM | POA: Diagnosis not present

## 2019-09-14 DIAGNOSIS — D123 Benign neoplasm of transverse colon: Secondary | ICD-10-CM | POA: Diagnosis not present

## 2019-09-14 LAB — CMP (CANCER CENTER ONLY)
ALT: 56 U/L — ABNORMAL HIGH (ref 0–44)
AST: 37 U/L (ref 15–41)
Albumin: 3.7 g/dL (ref 3.5–5.0)
Alkaline Phosphatase: 116 U/L (ref 38–126)
Anion gap: 9 (ref 5–15)
BUN: 13 mg/dL (ref 6–20)
CO2: 25 mmol/L (ref 22–32)
Calcium: 9 mg/dL (ref 8.9–10.3)
Chloride: 107 mmol/L (ref 98–111)
Creatinine: 1.12 mg/dL (ref 0.61–1.24)
GFR, Est AFR Am: >60 mL/min (ref >60)
GFR, Estimated: >60 mL/min (ref >60)
Glucose, Bld: 131 mg/dL — ABNORMAL HIGH (ref 70–99)
Potassium: 3.9 mmol/L (ref 3.5–5.1)
Sodium: 141 mmol/L (ref 135–145)
Total Bilirubin: 0.6 mg/dL (ref 0.3–1.2)
Total Protein: 6.8 g/dL (ref 6.5–8.1)

## 2019-09-14 LAB — CBC WITH DIFFERENTIAL (CANCER CENTER ONLY)
Abs Immature Granulocytes: 0.26 10*3/uL — ABNORMAL HIGH (ref 0.00–0.07)
Basophils Absolute: 0.1 10*3/uL (ref 0.0–0.1)
Basophils Relative: 1 %
Eosinophils Absolute: 0.1 10*3/uL (ref 0.0–0.5)
Eosinophils Relative: 2 %
HCT: 39.5 % (ref 39.0–52.0)
Hemoglobin: 14.4 g/dL (ref 13.0–17.0)
Immature Granulocytes: 4 %
Lymphocytes Relative: 23 %
Lymphs Abs: 1.6 10*3/uL (ref 0.7–4.0)
MCH: 34.4 pg — ABNORMAL HIGH (ref 26.0–34.0)
MCHC: 36.5 g/dL — ABNORMAL HIGH (ref 30.0–36.0)
MCV: 94.5 fL (ref 80.0–100.0)
Monocytes Absolute: 0.5 10*3/uL (ref 0.1–1.0)
Monocytes Relative: 7 %
Neutro Abs: 4.2 10*3/uL (ref 1.7–7.7)
Neutrophils Relative %: 63 %
Platelet Count: 211 10*3/uL (ref 150–400)
RBC: 4.18 MIL/uL — ABNORMAL LOW (ref 4.22–5.81)
RDW: 11.9 % (ref 11.5–15.5)
WBC Count: 6.7 10*3/uL (ref 4.0–10.5)
nRBC: 0 % (ref 0.0–0.2)

## 2019-09-14 MED ORDER — SODIUM CHLORIDE 0.9 % IV SOLN
2400.0000 mg/m2 | INTRAVENOUS | Status: DC
  Administered 2019-09-14: 5300 mg via INTRAVENOUS
  Filled 2019-09-14: qty 106

## 2019-09-14 MED ORDER — OXALIPLATIN CHEMO INJECTION 100 MG/20ML
85.0000 mg/m2 | Freq: Once | INTRAVENOUS | Status: AC
  Administered 2019-09-14: 185 mg via INTRAVENOUS
  Filled 2019-09-14: qty 37

## 2019-09-14 MED ORDER — SODIUM CHLORIDE 0.9% FLUSH
10.0000 mL | INTRAVENOUS | Status: DC | PRN
  Filled 2019-09-14: qty 10

## 2019-09-14 MED ORDER — HEPARIN SOD (PORK) LOCK FLUSH 100 UNIT/ML IV SOLN
500.0000 [IU] | Freq: Once | INTRAVENOUS | Status: DC | PRN
  Filled 2019-09-14: qty 5

## 2019-09-14 MED ORDER — DEXAMETHASONE SODIUM PHOSPHATE 10 MG/ML IJ SOLN
INTRAMUSCULAR | Status: AC
  Filled 2019-09-14: qty 1

## 2019-09-14 MED ORDER — LEUCOVORIN CALCIUM INJECTION 350 MG
400.0000 mg/m2 | Freq: Once | INTRAVENOUS | Status: AC
  Administered 2019-09-14: 880 mg via INTRAVENOUS
  Filled 2019-09-14: qty 44

## 2019-09-14 MED ORDER — FLUOROURACIL CHEMO INJECTION 2.5 GM/50ML
400.0000 mg/m2 | Freq: Once | INTRAVENOUS | Status: AC
  Administered 2019-09-14: 900 mg via INTRAVENOUS
  Filled 2019-09-14: qty 18

## 2019-09-14 MED ORDER — PALONOSETRON HCL INJECTION 0.25 MG/5ML
0.2500 mg | Freq: Once | INTRAVENOUS | Status: AC
  Administered 2019-09-14: 0.25 mg via INTRAVENOUS

## 2019-09-14 MED ORDER — SODIUM CHLORIDE 0.9% FLUSH
10.0000 mL | INTRAVENOUS | Status: DC | PRN
  Administered 2019-09-14: 10 mL via INTRAVENOUS
  Filled 2019-09-14: qty 10

## 2019-09-14 MED ORDER — PALONOSETRON HCL INJECTION 0.25 MG/5ML
INTRAVENOUS | Status: AC
  Filled 2019-09-14: qty 5

## 2019-09-14 MED ORDER — DEXAMETHASONE SODIUM PHOSPHATE 10 MG/ML IJ SOLN
10.0000 mg | Freq: Once | INTRAMUSCULAR | Status: AC
  Administered 2019-09-14: 10 mg via INTRAVENOUS

## 2019-09-14 MED ORDER — DEXTROSE 5 % IV SOLN
Freq: Once | INTRAVENOUS | Status: AC
  Filled 2019-09-14: qty 250

## 2019-09-14 NOTE — Patient Instructions (Signed)
[facility] Discharge Instructions for Patients Receiving Chemotherapy  Today you received the following chemotherapy agents  oxaliplatin, flurouracil, and leucovoran  To help prevent nausea and vomiting after your treatment, we encourage you to take your nausea medication If you develop nausea and vomiting that is not controlled by your nausea medication, call the clinic. If it is after clinic hours your family physician or the after hours number for the clinic or go to the Emergency Department.   BELOW ARE SYMPTOMS THAT SHOULD BE REPORTED IMMEDIATELY:  *FEVER GREATER THAN 101.0 F  *CHILLS WITH OR WITHOUT FEVER  NAUSEA AND VOMITING THAT IS NOT CONTROLLED WITH YOUR NAUSEA MEDICATION  *UNUSUAL SHORTNESS OF BREATH  *UNUSUAL BRUISING OR BLEEDING  TENDERNESS IN MOUTH AND THROAT WITH OR WITHOUT PRESENCE OF ULCERS  *URINARY PROBLEMS  *BOWEL PROBLEMS  UNUSUAL RASH Items with * indicate a potential emergency and should be followed up as soon as possible.  One of the nurses will contact you 24 hours after your treatment. Please let the nurse know about any problems that you may have experienced. Feel free to call the clinic you have any questions or concerns. The clinic phone number is [phone].   I have been informed and understand all the instructions given to me. I know to contact the clinic, my physician, or go to the Emergency Department if any problems should occur. I do not have any questions at this time, but understand that I may call the clinic during office hours or the Patient Navigator at [phone] should I have any questions or need assistance in obtaining follow up care.    __________________________________________  _____________  __________ Signature of Patient or Authorized Representative            Date                   Time    __________________________________________ Nurse's Signature

## 2019-09-14 NOTE — Patient Instructions (Signed)

## 2019-09-14 NOTE — Telephone Encounter (Signed)
No los per 2/25. 

## 2019-09-15 ENCOUNTER — Inpatient Hospital Stay: Payer: BC Managed Care – PPO

## 2019-09-16 ENCOUNTER — Inpatient Hospital Stay: Payer: BC Managed Care – PPO

## 2019-09-16 ENCOUNTER — Other Ambulatory Visit: Payer: Self-pay

## 2019-09-16 VITALS — BP 142/101 | HR 75 | Temp 98.7°F | Resp 18

## 2019-09-16 DIAGNOSIS — D125 Benign neoplasm of sigmoid colon: Secondary | ICD-10-CM | POA: Diagnosis not present

## 2019-09-16 DIAGNOSIS — Z5189 Encounter for other specified aftercare: Secondary | ICD-10-CM | POA: Diagnosis not present

## 2019-09-16 DIAGNOSIS — D124 Benign neoplasm of descending colon: Secondary | ICD-10-CM | POA: Diagnosis not present

## 2019-09-16 DIAGNOSIS — C20 Malignant neoplasm of rectum: Secondary | ICD-10-CM | POA: Diagnosis not present

## 2019-09-16 DIAGNOSIS — Z7289 Other problems related to lifestyle: Secondary | ICD-10-CM | POA: Diagnosis not present

## 2019-09-16 DIAGNOSIS — R5383 Other fatigue: Secondary | ICD-10-CM | POA: Diagnosis not present

## 2019-09-16 DIAGNOSIS — D123 Benign neoplasm of transverse colon: Secondary | ICD-10-CM | POA: Diagnosis not present

## 2019-09-16 DIAGNOSIS — E785 Hyperlipidemia, unspecified: Secondary | ICD-10-CM | POA: Diagnosis not present

## 2019-09-16 DIAGNOSIS — K5730 Diverticulosis of large intestine without perforation or abscess without bleeding: Secondary | ICD-10-CM | POA: Diagnosis not present

## 2019-09-16 DIAGNOSIS — M25519 Pain in unspecified shoulder: Secondary | ICD-10-CM | POA: Diagnosis not present

## 2019-09-16 DIAGNOSIS — Z5111 Encounter for antineoplastic chemotherapy: Secondary | ICD-10-CM | POA: Diagnosis not present

## 2019-09-16 DIAGNOSIS — K625 Hemorrhage of anus and rectum: Secondary | ICD-10-CM | POA: Diagnosis not present

## 2019-09-16 DIAGNOSIS — I10 Essential (primary) hypertension: Secondary | ICD-10-CM | POA: Diagnosis not present

## 2019-09-16 DIAGNOSIS — Z21 Asymptomatic human immunodeficiency virus [HIV] infection status: Secondary | ICD-10-CM | POA: Diagnosis not present

## 2019-09-16 DIAGNOSIS — D122 Benign neoplasm of ascending colon: Secondary | ICD-10-CM | POA: Diagnosis not present

## 2019-09-16 DIAGNOSIS — I700 Atherosclerosis of aorta: Secondary | ICD-10-CM | POA: Diagnosis not present

## 2019-09-16 MED ORDER — PEGFILGRASTIM-CBQV 6 MG/0.6ML ~~LOC~~ SOSY
PREFILLED_SYRINGE | SUBCUTANEOUS | Status: AC
  Filled 2019-09-16: qty 0.6

## 2019-09-16 MED ORDER — SODIUM CHLORIDE 0.9% FLUSH
10.0000 mL | INTRAVENOUS | Status: DC | PRN
  Administered 2019-09-16: 10 mL
  Filled 2019-09-16: qty 10

## 2019-09-16 MED ORDER — HEPARIN SOD (PORK) LOCK FLUSH 100 UNIT/ML IV SOLN
500.0000 [IU] | Freq: Once | INTRAVENOUS | Status: AC | PRN
  Administered 2019-09-16: 500 [IU]
  Filled 2019-09-16: qty 5

## 2019-09-16 MED ORDER — PEGFILGRASTIM-CBQV 6 MG/0.6ML ~~LOC~~ SOSY
6.0000 mg | PREFILLED_SYRINGE | Freq: Once | SUBCUTANEOUS | Status: AC
  Administered 2019-09-16: 10:00:00 6 mg via SUBCUTANEOUS

## 2019-09-20 ENCOUNTER — Other Ambulatory Visit: Payer: BC Managed Care – PPO

## 2019-09-21 NOTE — Progress Notes (Signed)
Joshua Zhang   Telephone:(336) [phone] Fax:(336) [phone]   Clinic Follow up Note   Patient Care Team: Merwyn Katos as PCP - General (Physician Assistant) Michel Bickers, MD as Consulting Physician (Infectious Diseases) Leighton Ruff, MD as Consulting Physician (General Surgery) Truitt Merle, MD as Consulting Physician (Hematology) Kyung Rudd, MD as Consulting Physician (Radiation Oncology) Jonnie Finner, RN as Oncology Nurse Navigator  Date of Service:  09/28/2019  CHIEF COMPLAINT:  F/u of rectal cancer   SUMMARY OF ONCOLOGIC HISTORY: Oncology History Overview Note  Cancer Staging Rectal adenocarcinoma [facility]) Staging form: Colon and Rectum, AJCC 8th Edition - Clinical stage from 08/21/2019: Stage IIIB (cT3, cN1, cM0) - Signed by Truitt Merle, MD on 08/21/2019    Rectal adenocarcinoma ([place])  07/03/2019 Procedure   Colonoscopy by Dr. Collene Mares  IMPRESSION -Likely Malignant 5cm, circumferential, bleeding tumor in the rectum, 304 cm from the anal verge -biopsy done  -One 8 mm sessile polyp in the distal sigmoid colon, removed with a hot snare x2; resected and retrieved.  -Two small sessile polyps, 1 in the mid-descending colon and 1 in the mid transverse colon - removed by cold biopsies.  -One 85m sessile polyp in the mid transverse colon, removed with a hot snare x1; resected and retrieved.  -One 866msessile polyp in the proximal ascending colon, removed with a hot snare X1; resected and retrieved.  -few large scattered diverticula.   07/03/2019 Initial Biopsy   FINAL DIAGNOSIS 07/03/19  A. Colon, Descending, Polyp, Polypectomy:   -TUBULAR ADENOMA   -No high grade dysplasia or malignancy.  B. Colon, transverse, polyp, polypectomy:   -TUBULAR ADENOMA  -No high grade dysplasia or malignancy. C. Colon, Ascending, Polyp, Polypectomy:   -Fragments of sessile serrated adenoma/polyp D. Colon, sigmoid, polyp, polypectomy:   -Fragments of Tubular Adenoma  -No  high grade dysplasia or malignancy. E. Rectum, Mass, Biopsy:   -INVASIVE COLONIC ADENOCARCINOMA, MODERATELY-DIFFERENTIATED, see comment     07/12/2019 Imaging   CT AP W Contrast 07/12/19  IMPRESSION: 1. No evidence of metastatic disease within the chest, abdomen or pelvis. 2. Possible mild wall thickening in the sigmoid colon, although no focal colonic mass is identified. There are few distal colonic diverticula. 3. Sequela of prior granulomatous disease. 4. Possible cholelithiasis with mild wall thickening of the gallbladder fundus. 5. Aortic Atherosclerosis (ICD10-I70.0).   08/21/2019 Initial Diagnosis   Rectal adenocarcinoma ([facility]  08/21/2019 Cancer Staging   Staging form: Colon and Rectum, AJCC 8th Edition - Clinical stage from 08/21/2019: Stage IIIB (cT3, cN1, cM0) - Signed by FeTruitt MerleMD on 08/21/2019   08/31/2019 -  Chemotherapy   FOLFOX q2weeks for 8 cycles starting  08/31/19      CURRENT THERAPY:  FOLFOX q2weeks starting 08/31/19   INTERVAL HISTORY:  Joshua MCGLOCKLINs here for a follow up and treatment. He presents to the clinic alone. He notes fatigue and soreness from chemo for the first 2 days along with no appetite, but recovered by day 3. He notes he thinks he has right foot gout since last week. He only has mild pain but he can still walk. He notes this has happened 2 months ago and went away. [place] and would lie to get [place] soon.    REVIEW OF SYSTEMS:   Constitutional: Denies fevers, chills or abnormal weight loss Eyes: Denies blurriness of vision Ears, nose, mouth, throat, and face: Denies mucositis or sore throat Respiratory: Denies cough, dyspnea or  wheezes Cardiovascular: Denies palpitation, chest discomfort or lower extremity swelling Gastrointestinal:  Denies nausea, heartburn or change in bowel habits Skin: Denies abnormal skin rashes Lymphatics: Denies new lymphadenopathy or easy bruising (+) right anterior foot gout    Neurological:Denies numbness, tingling or new weaknesses Behavioral/Psych: Mood is stable, no new changes  All other systems were reviewed with the patient and are negative.  MEDICAL HISTORY:  Past Medical History:  Diagnosis Date  . Hypertension     SURGICAL HISTORY: Past Surgical History:  Procedure Laterality Date  . APPENDECTOMY     49 years old   . IR IMAGING GUIDED PORT INSERTION  08/30/2019  . SHOULDER SURGERY Bilateral 49 years old    I have reviewed the social history and family history with the patient and they are unchanged from previous note.  ALLERGIES:  has No Known Allergies.  MEDICATIONS:  Current Outpatient Medications  Medication Sig Dispense Refill  . amLODipine (NORVASC) 5 MG tablet Take by mouth.    Marland Kitchen atorvastatin (LIPITOR) 10 MG tablet Take by mouth.    Marland Kitchen BIKTARVY 50-200-25 MG TABS tablet TAKE 1 TABLET BY MOUTH ONCE DAILY 30 tablet 5  . lidocaine-prilocaine (EMLA) cream Apply to affected area once 30 g 3  . losartan (COZAAR) 25 MG tablet Take by mouth.    . Multiple Vitamin (MULTIVITAMIN) tablet Take 1 tablet by mouth daily.    . ondansetron (ZOFRAN) 8 MG tablet Take 1 tablet (8 mg total) by mouth 2 (two) times daily as needed for refractory nausea / vomiting. Start on day 3 after chemotherapy. 30 tablet 1  . prochlorperazine (COMPAZINE) 10 MG tablet Take 1 tablet (10 mg total) by mouth every 6 (six) hours as needed (Nausea or vomiting). 30 tablet 1   No current facility-administered medications for this visit.   Facility-Administered Medications Ordered in Other Visits  Medication Dose Route Frequency Provider Last Rate Last Admin  . fluorouracil (ADRUCIL) 5,300 mg in sodium chloride 0.9 % 144 mL chemo infusion  2,400 mg/m2 (Treatment Plan Recorded) Intravenous 1 day or 1 dose Truitt Merle, MD      . fluorouracil (ADRUCIL) chemo injection 900 mg  400 mg/m2 (Treatment Plan Recorded) Intravenous Once Truitt Merle, MD      . leucovorin 880 mg in dextrose 5 %  250 mL infusion  400 mg/m2 (Treatment Plan Recorded) Intravenous Once Truitt Merle, MD      . oxaliplatin (ELOXATIN) 185 mg in dextrose 5 % 500 mL chemo infusion  85 mg/m2 (Treatment Plan Recorded) Intravenous Once Truitt Merle, MD        PHYSICAL EXAMINATION: ECOG PERFORMANCE STATUS: 0 - Asymptomatic  Vitals:   09/28/19 0813  BP: 129/88  Pulse: 92  Resp: 18  Temp: 98.2 F (36.8 C)  SpO2: 99%   Filed Weights   09/28/19 0813  Weight: 203 lb 6.4 oz (92.3 kg)    Due to COVID19 we will limit examination to appearance. Patient had no complaints.  GENERAL:alert, no distress and comfortable SKIN: skin color normal, no rashes or significant lesions EYES: normal, Conjunctiva are pink and non-injected, sclera clear  NEURO: alert & oriented x 3 with fluent speech   LABORATORY DATA:  I have reviewed the data as listed CBC Latest Ref Rng & Units 09/28/2019 09/14/2019 08/31/2019  WBC 4.0 - 10.5 K/uL 9.3 6.7 5.3  Hemoglobin 13.0 - 17.0 g/dL 14.7 14.4 14.8  Hematocrit 39.0 - 52.0 % 40.6 39.5 39.8  Platelets 150 - 400 K/uL 181 211 199  CMP Latest Ref Rng & Units 09/28/2019 09/14/2019 08/31/2019  Glucose 70 - 99 mg/dL 109(H) 131(H) 106(H)  BUN 6 - 20 mg/dL _0 Creatinine 0.61 - 1.24 mg/dL 1.05 1.12 0.96  Sodium 135 - 145 mmol/L 142 141 138  Potassium 3.5 - 5.1 mmol/L 3.9 3.9 4.0  Chloride 98 - 111 mmol/L 108 107 108  CO2 22 - 32 mmol/L _1 Calcium 8.9 - 10.3 mg/dL 9.1 9.0 8.4(L)  Total Protein 6.5 - 8.1 g/dL 6.9 6.8 6.7  Total Bilirubin 0.3 - 1.2 mg/dL 0.7 0.6 0.8  Alkaline Phos 38 - 126 U/L 132(H) 116 91  AST 15 - 41 U/L 48(H) 37 25  ALT 0 - 44 U/L 60(H) 56(H) 34      RADIOGRAPHIC STUDIES: I have personally reviewed the radiological images as listed and agreed with the findings in the report. No results found.   ASSESSMENT & PLAN:  Joshua Zhang is a 49 y.o. male with    1.Low rectaladenocarcinoma,cT3N1M0,Stage IIIB, MMR normal  -He presented with  intermittent rectal bleeding for 2 years, but otherwise asymptomatic, this was discovered on screening colonoscopy in 06/2019.  -Colonoscopy and scan shows he has low rectum mass which is close to sphincter. Biopsy from screening colonoscopy showed this is moderately-differentiated adenocarcinoma locally advanced. Based on MRI, this is cT3N1 stage III disease.  -I started him on total neoadjuvant chemotherapy with FOLFOX q2weeks for 8 cycles on 08/31/19. This will be followed by chemoRT before proceeding with curative surgery.  -I discussed option to forego surgery if he hascompleteresponse to neoadjuvant treatment, but given young ageand low possibility of complete response from chemo and radiation, I recommend surgerywhich is the standard care now.  -His tumor MMR was intact, lynch syndrome is ruled out.   -s/p C2 he notes having fatigue, soreness and no appetite for 2 days but can recover by day 3. This has lead to a few pound weight loss. He notes only having cold sensitivity day of infusion. I encouraged him to work on maintaining his weight.  -Labs reviewed and adequate to proceed with C3 FOLFOX today  -f/u in 2 weeks  -He is fine to proceed with COVID19 vaccine on his off week of treatment. He is interested.     2. Rectal bleeding, secondary to #1 -Has been intermittent and stable for the last 2 years.  -He has had loose stool once daily lately.  -no anemia -Will monitor on neoadjuvant treatment.    3. HIV, controlled -He denies h/o or current use of recreational or IV drugs. He is unsure how he initially got infected.  -Controlled and undetectable. 09/15/18 CD4 was 350. His 08/23/19 CD4 is 562 -He is on Biktavy. This is managed by Dr. Megan Salon.  -I will check for any drug interaction with his chemo.    4. HLD, HTN -He will continue medications and f/u with PCP  5. Gout  -Patient feels he has gout of right anterior foot. This did occur 2 months ago and went away on its  own.  -I recommend he drink plenty of water and take ibuprofen.    PLAN: -Labs reviewed and adequate to proceed with C3 FOLFOX today  -lab, flush, f/u and chemo FOLFOX in 2, 4, 6, 8 weeks    No problem-specific Assessment & Plan notes found for this encounter.   No orders of the defined types were placed in this encounter.  All questions were answered. The patient knows to call  the clinic with any problems, questions or concerns. No barriers to learning was detected. The total time spent in the appointment was 30 minutes.     Truitt Merle, MD 09/28/2019   I, Joslyn Devon, am acting as scribe for Truitt Merle, MD.   I have reviewed the above documentation for accuracy and completeness, and I agree with the above.

## 2019-09-26 MED FILL — BIKTARVY 50-200-25 MG TABS: 50-200-25 | 30 days supply | Qty: 30 | Fill #3

## 2019-09-28 ENCOUNTER — Inpatient Hospital Stay: Payer: BC Managed Care – PPO | Admitting: Hematology

## 2019-09-28 ENCOUNTER — Inpatient Hospital Stay: Payer: BC Managed Care – PPO

## 2019-09-28 ENCOUNTER — Inpatient Hospital Stay: Payer: BC Managed Care – PPO | Attending: Hematology

## 2019-09-28 ENCOUNTER — Encounter: Payer: Self-pay | Admitting: Hematology

## 2019-09-28 ENCOUNTER — Other Ambulatory Visit: Payer: Self-pay

## 2019-09-28 VITALS — BP 129/88 | HR 92 | Temp 98.2°F | Resp 18 | Ht 70.0 in | Wt 203.4 lb

## 2019-09-28 DIAGNOSIS — Z5189 Encounter for other specified aftercare: Secondary | ICD-10-CM | POA: Insufficient documentation

## 2019-09-28 DIAGNOSIS — D123 Benign neoplasm of transverse colon: Secondary | ICD-10-CM | POA: Insufficient documentation

## 2019-09-28 DIAGNOSIS — G4700 Insomnia, unspecified: Secondary | ICD-10-CM | POA: Diagnosis not present

## 2019-09-28 DIAGNOSIS — R5383 Other fatigue: Secondary | ICD-10-CM | POA: Diagnosis not present

## 2019-09-28 DIAGNOSIS — D125 Benign neoplasm of sigmoid colon: Secondary | ICD-10-CM | POA: Diagnosis not present

## 2019-09-28 DIAGNOSIS — B20 Human immunodeficiency virus [HIV] disease: Secondary | ICD-10-CM | POA: Insufficient documentation

## 2019-09-28 DIAGNOSIS — M109 Gout, unspecified: Secondary | ICD-10-CM | POA: Diagnosis not present

## 2019-09-28 DIAGNOSIS — C20 Malignant neoplasm of rectum: Secondary | ICD-10-CM

## 2019-09-28 DIAGNOSIS — K625 Hemorrhage of anus and rectum: Secondary | ICD-10-CM | POA: Insufficient documentation

## 2019-09-28 DIAGNOSIS — Z5111 Encounter for antineoplastic chemotherapy: Secondary | ICD-10-CM | POA: Diagnosis not present

## 2019-09-28 DIAGNOSIS — E785 Hyperlipidemia, unspecified: Secondary | ICD-10-CM | POA: Diagnosis not present

## 2019-09-28 DIAGNOSIS — R21 Rash and other nonspecific skin eruption: Secondary | ICD-10-CM | POA: Insufficient documentation

## 2019-09-28 DIAGNOSIS — D124 Benign neoplasm of descending colon: Secondary | ICD-10-CM | POA: Insufficient documentation

## 2019-09-28 DIAGNOSIS — R6884 Jaw pain: Secondary | ICD-10-CM | POA: Diagnosis not present

## 2019-09-28 DIAGNOSIS — D696 Thrombocytopenia, unspecified: Secondary | ICD-10-CM | POA: Diagnosis not present

## 2019-09-28 DIAGNOSIS — Z79899 Other long term (current) drug therapy: Secondary | ICD-10-CM | POA: Diagnosis not present

## 2019-09-28 DIAGNOSIS — D122 Benign neoplasm of ascending colon: Secondary | ICD-10-CM | POA: Diagnosis not present

## 2019-09-28 DIAGNOSIS — I700 Atherosclerosis of aorta: Secondary | ICD-10-CM | POA: Insufficient documentation

## 2019-09-28 DIAGNOSIS — Z95828 Presence of other vascular implants and grafts: Secondary | ICD-10-CM

## 2019-09-28 DIAGNOSIS — I10 Essential (primary) hypertension: Secondary | ICD-10-CM | POA: Diagnosis not present

## 2019-09-28 LAB — CMP (CANCER CENTER ONLY)
ALT: 60 U/L — ABNORMAL HIGH (ref 0–44)
AST: 48 U/L — ABNORMAL HIGH (ref 15–41)
Albumin: 3.6 g/dL (ref 3.5–5.0)
Alkaline Phosphatase: 132 U/L — ABNORMAL HIGH (ref 38–126)
Anion gap: 9 (ref 5–15)
BUN: 11 mg/dL (ref 6–20)
CO2: 25 mmol/L (ref 22–32)
Calcium: 9.1 mg/dL (ref 8.9–10.3)
Chloride: 108 mmol/L (ref 98–111)
Creatinine: 1.05 mg/dL (ref 0.61–1.24)
GFR, Est AFR Am: >60 mL/min (ref >60)
GFR, Estimated: >60 mL/min (ref >60)
Glucose, Bld: 109 mg/dL — ABNORMAL HIGH (ref 70–99)
Potassium: 3.9 mmol/L (ref 3.5–5.1)
Sodium: 142 mmol/L (ref 135–145)
Total Bilirubin: 0.7 mg/dL (ref 0.3–1.2)
Total Protein: 6.9 g/dL (ref 6.5–8.1)

## 2019-09-28 LAB — CBC WITH DIFFERENTIAL (CANCER CENTER ONLY)
Abs Immature Granulocytes: 0.21 10*3/uL — ABNORMAL HIGH (ref 0.00–0.07)
Basophils Absolute: 0 10*3/uL (ref 0.0–0.1)
Basophils Relative: 0 %
Eosinophils Absolute: 0.1 10*3/uL (ref 0.0–0.5)
Eosinophils Relative: 1 %
HCT: 40.6 % (ref 39.0–52.0)
Hemoglobin: 14.7 g/dL (ref 13.0–17.0)
Immature Granulocytes: 2 %
Lymphocytes Relative: 18 %
Lymphs Abs: 1.7 10*3/uL (ref 0.7–4.0)
MCH: 34.6 pg — ABNORMAL HIGH (ref 26.0–34.0)
MCHC: 36.2 g/dL — ABNORMAL HIGH (ref 30.0–36.0)
MCV: 95.5 fL (ref 80.0–100.0)
Monocytes Absolute: 0.7 10*3/uL (ref 0.1–1.0)
Monocytes Relative: 8 %
Neutro Abs: 6.6 10*3/uL (ref 1.7–7.7)
Neutrophils Relative %: 71 %
Platelet Count: 181 10*3/uL (ref 150–400)
RBC: 4.25 MIL/uL (ref 4.22–5.81)
RDW: 13.1 % (ref 11.5–15.5)
WBC Count: 9.3 10*3/uL (ref 4.0–10.5)
nRBC: 0 % (ref 0.0–0.2)

## 2019-09-28 MED ORDER — DEXAMETHASONE SODIUM PHOSPHATE 10 MG/ML IJ SOLN
10.0000 mg | Freq: Once | INTRAMUSCULAR | Status: AC
  Administered 2019-09-28: 10 mg via INTRAVENOUS

## 2019-09-28 MED ORDER — SODIUM CHLORIDE 0.9 % IV SOLN
2400.0000 mg/m2 | INTRAVENOUS | Status: DC
  Administered 2019-09-28: 5300 mg via INTRAVENOUS
  Filled 2019-09-28: qty 106

## 2019-09-28 MED ORDER — PALONOSETRON HCL INJECTION 0.25 MG/5ML
0.2500 mg | Freq: Once | INTRAVENOUS | Status: AC
  Administered 2019-09-28: 0.25 mg via INTRAVENOUS

## 2019-09-28 MED ORDER — DEXAMETHASONE SODIUM PHOSPHATE 10 MG/ML IJ SOLN
INTRAMUSCULAR | Status: AC
  Filled 2019-09-28: qty 1

## 2019-09-28 MED ORDER — SODIUM CHLORIDE 0.9% FLUSH
10.0000 mL | INTRAVENOUS | Status: DC | PRN
  Administered 2019-09-28: 10 mL via INTRAVENOUS
  Filled 2019-09-28: qty 10

## 2019-09-28 MED ORDER — DEXTROSE 5 % IV SOLN
Freq: Once | INTRAVENOUS | Status: AC
  Filled 2019-09-28: qty 250

## 2019-09-28 MED ORDER — OXALIPLATIN CHEMO INJECTION 100 MG/20ML
85.0000 mg/m2 | Freq: Once | INTRAVENOUS | Status: AC
  Administered 2019-09-28: 185 mg via INTRAVENOUS
  Filled 2019-09-28: qty 37

## 2019-09-28 MED ORDER — HEPARIN SOD (PORK) LOCK FLUSH 100 UNIT/ML IV SOLN
500.0000 [IU] | Freq: Once | INTRAVENOUS | Status: DC
  Filled 2019-09-28: qty 5

## 2019-09-28 MED ORDER — FLUOROURACIL CHEMO INJECTION 2.5 GM/50ML
400.0000 mg/m2 | Freq: Once | INTRAVENOUS | Status: AC
  Administered 2019-09-28: 900 mg via INTRAVENOUS
  Filled 2019-09-28: qty 18

## 2019-09-28 MED ORDER — LEUCOVORIN CALCIUM INJECTION 350 MG
400.0000 mg/m2 | Freq: Once | INTRAVENOUS | Status: AC
  Administered 2019-09-28: 880 mg via INTRAVENOUS
  Filled 2019-09-28: qty 44

## 2019-09-28 MED ORDER — PALONOSETRON HCL INJECTION 0.25 MG/5ML
INTRAVENOUS | Status: AC
  Filled 2019-09-28: qty 5

## 2019-09-28 NOTE — Patient Instructions (Signed)
[facility] Discharge Instructions for Patients Receiving Chemotherapy  Today you received the following chemotherapy agents Oxaliplatin (ELOXATIN), Leucovorin & Flourouracil (ADUCIL).  To help prevent nausea and vomiting after your treatment, we encourage you to take your nausea medication as prescribed.   If you develop nausea and vomiting that is not controlled by your nausea medication, call the clinic.   BELOW ARE SYMPTOMS THAT SHOULD BE REPORTED IMMEDIATELY:  *FEVER GREATER THAN 100.5 F  *CHILLS WITH OR WITHOUT FEVER  NAUSEA AND VOMITING THAT IS NOT CONTROLLED WITH YOUR NAUSEA MEDICATION  *UNUSUAL SHORTNESS OF BREATH  *UNUSUAL BRUISING OR BLEEDING  TENDERNESS IN MOUTH AND THROAT WITH OR WITHOUT PRESENCE OF ULCERS  *URINARY PROBLEMS  *BOWEL PROBLEMS  UNUSUAL RASH Items with * indicate a potential emergency and should be followed up as soon as possible.  Feel free to call the clinic should you have any questions or concerns. The clinic phone number is [phone].  Please show the [facility] at check-in to the Emergency Department and triage nurse.  Coronavirus (COVID-19) Are you at risk?  Are you at risk for the Coronavirus (COVID-19)?  To be considered HIGH RISK for Coronavirus (COVID-19), you have to meet the following criteria:  . Traveled to [place], [place], [place], [place] or [place]; or in the [place] to [place], [place], [place], or [place]; and have fever, cough, and shortness of breath within the last 2 weeks of travel OR . Been in close contact with a person diagnosed with COVID-19 within the last 2 weeks and have fever, cough, and shortness of breath . IF YOU DO NOT MEET THESE CRITERIA, YOU ARE CONSIDERED LOW RISK FOR COVID-19.  What to do if you are HIGH RISK for COVID-19?  Marland Kitchen If you are having a medical emergency, call 911. . Seek medical care right away. Before you go to a doctor's office, urgent care or emergency  department, call ahead and tell them about your recent travel, contact with someone diagnosed with COVID-19, and your symptoms. You should receive instructions from your physician's office regarding next steps of care.  . When you arrive at healthcare provider, tell the healthcare staff immediately you have returned from visiting [place], [place], [place], [place] or [place]; or traveled in the [place] to [place], [place], [place], or [place]; in the last two weeks or you have been in close contact with a person diagnosed with COVID-19 in the last 2 weeks.   . Tell the health care staff about your symptoms: fever, cough and shortness of breath. . After you have been seen by a medical provider, you will be either: o Tested for (COVID-19) and discharged home on quarantine except to seek medical care if symptoms worsen, and asked to  - Stay home and avoid contact with others until you get your results (4-5 days)  - Avoid travel on public transportation if possible (such as bus, train, or airplane) or o Sent to the Emergency Department by EMS for evaluation, COVID-19 testing, and possible admission depending on your condition and test results.  What to do if you are LOW RISK for COVID-19?  Reduce your risk of any infection by using the same precautions used for avoiding the common cold or flu:  Marland Kitchen Wash your hands often with soap and warm water for at least 20 seconds.  If soap and water are not readily available, use an alcohol-based hand sanitizer with at least 60% alcohol.  Marland Kitchen  If coughing or sneezing, cover your mouth and nose by coughing or sneezing into the elbow areas of your shirt or coat, into a tissue or into your sleeve (not your hands). . Avoid shaking hands with others and consider head nods or verbal greetings only. . Avoid touching your eyes, nose, or mouth with unwashed hands.  . Avoid close contact with people who are sick. . Avoid places or events with large numbers of people  in one location, like concerts or sporting events. . Carefully consider travel plans you have or are making. . If you are planning any travel outside or inside the [place], visit the CDC's Travelers' Health webpage for the latest health notices. . If you have some symptoms but not all symptoms, continue to monitor at home and seek medical attention if your symptoms worsen. . If you are having a medical emergency, call 911.   [facility] / e-Visit: [email or web address]         [facility]: [facility]: [phone]                   [facility]: [phone]

## 2019-09-29 ENCOUNTER — Telehealth: Payer: Self-pay | Admitting: Hematology

## 2019-09-29 NOTE — Telephone Encounter (Signed)
Scheduled appt per 3/11 los. 

## 2019-09-30 ENCOUNTER — Other Ambulatory Visit: Payer: Self-pay

## 2019-09-30 ENCOUNTER — Inpatient Hospital Stay: Payer: BC Managed Care – PPO

## 2019-09-30 VITALS — BP 128/97 | HR 67 | Temp 98.7°F | Resp 20

## 2019-09-30 DIAGNOSIS — Z5189 Encounter for other specified aftercare: Secondary | ICD-10-CM | POA: Diagnosis not present

## 2019-09-30 DIAGNOSIS — G4700 Insomnia, unspecified: Secondary | ICD-10-CM | POA: Diagnosis not present

## 2019-09-30 DIAGNOSIS — R21 Rash and other nonspecific skin eruption: Secondary | ICD-10-CM | POA: Diagnosis not present

## 2019-09-30 DIAGNOSIS — R6884 Jaw pain: Secondary | ICD-10-CM | POA: Diagnosis not present

## 2019-09-30 DIAGNOSIS — Z5111 Encounter for antineoplastic chemotherapy: Secondary | ICD-10-CM | POA: Diagnosis not present

## 2019-09-30 DIAGNOSIS — D124 Benign neoplasm of descending colon: Secondary | ICD-10-CM | POA: Diagnosis not present

## 2019-09-30 DIAGNOSIS — C20 Malignant neoplasm of rectum: Secondary | ICD-10-CM

## 2019-09-30 DIAGNOSIS — I10 Essential (primary) hypertension: Secondary | ICD-10-CM | POA: Diagnosis not present

## 2019-09-30 DIAGNOSIS — K625 Hemorrhage of anus and rectum: Secondary | ICD-10-CM | POA: Diagnosis not present

## 2019-09-30 DIAGNOSIS — B20 Human immunodeficiency virus [HIV] disease: Secondary | ICD-10-CM | POA: Diagnosis not present

## 2019-09-30 DIAGNOSIS — D123 Benign neoplasm of transverse colon: Secondary | ICD-10-CM | POA: Diagnosis not present

## 2019-09-30 DIAGNOSIS — E785 Hyperlipidemia, unspecified: Secondary | ICD-10-CM | POA: Diagnosis not present

## 2019-09-30 DIAGNOSIS — D125 Benign neoplasm of sigmoid colon: Secondary | ICD-10-CM | POA: Diagnosis not present

## 2019-09-30 DIAGNOSIS — D696 Thrombocytopenia, unspecified: Secondary | ICD-10-CM | POA: Diagnosis not present

## 2019-09-30 DIAGNOSIS — D122 Benign neoplasm of ascending colon: Secondary | ICD-10-CM | POA: Diagnosis not present

## 2019-09-30 DIAGNOSIS — I700 Atherosclerosis of aorta: Secondary | ICD-10-CM | POA: Diagnosis not present

## 2019-09-30 MED ORDER — HEPARIN SOD (PORK) LOCK FLUSH 100 UNIT/ML IV SOLN
500.0000 [IU] | Freq: Once | INTRAVENOUS | Status: AC | PRN
  Administered 2019-09-30: 500 [IU]
  Filled 2019-09-30: qty 5

## 2019-09-30 MED ORDER — SODIUM CHLORIDE 0.9% FLUSH
10.0000 mL | INTRAVENOUS | Status: DC | PRN
  Administered 2019-09-30: 10 mL
  Filled 2019-09-30: qty 10

## 2019-09-30 MED ORDER — PEGFILGRASTIM-CBQV 6 MG/0.6ML ~~LOC~~ SOSY
6.0000 mg | PREFILLED_SYRINGE | Freq: Once | SUBCUTANEOUS | Status: AC
  Administered 2019-09-30: 6 mg via SUBCUTANEOUS

## 2019-09-30 NOTE — Patient Instructions (Signed)

## 2019-10-04 ENCOUNTER — Ambulatory Visit: Payer: BC Managed Care – PPO | Admitting: Internal Medicine

## 2019-10-05 NOTE — Progress Notes (Signed)
[facility]   [phone] [phone]   Clinic Follow up Note   Patient Care Team: Merwyn Katos as PCP - General (Physician Assistant) Michel Bickers, MD as Consulting Physician (Infectious Diseases) Leighton Ruff, MD as Consulting Physician (General Surgery) Truitt Merle, MD as Consulting Physician (Hematology) Kyung Rudd, MD as Consulting Physician (Radiation Oncology) Jonnie Finner, RN as Oncology Nurse Navigator  Date of Service:  10/12/2019  CHIEF COMPLAINT: F/u of rectal cancer  SUMMARY OF ONCOLOGIC HISTORY: Oncology History Overview Note  Cancer Staging Rectal adenocarcinoma [facility]) Staging form: Colon and Rectum, AJCC 8th Edition - Clinical stage from 08/21/2019: Stage IIIB (cT3, cN1, cM0) - Signed by Truitt Merle, MD on 08/21/2019    Rectal adenocarcinoma ([facility])  07/03/2019 Procedure   Colonoscopy by Dr. Collene Mares  IMPRESSION -Likely Malignant 5cm, circumferential, bleeding tumor in the rectum, 304 cm from the anal verge -biopsy done  -One 8 mm sessile polyp in the distal sigmoid colon, removed with a hot snare x2; resected and retrieved.  -Two small sessile polyps, 1 in the mid-descending colon and 1 in the mid transverse colon - removed by cold biopsies.  -One 4m sessile polyp in the mid transverse colon, removed with a hot snare x1; resected and retrieved.  -One 837msessile polyp in the proximal ascending colon, removed with a hot snare X1; resected and retrieved.  -few large scattered diverticula.   07/03/2019 Initial Biopsy   FINAL DIAGNOSIS 07/03/19  A. Colon, Descending, Polyp, Polypectomy:   -TUBULAR ADENOMA   -No high grade dysplasia or malignancy.  B. Colon, transverse, polyp, polypectomy:   -TUBULAR ADENOMA  -No high grade dysplasia or malignancy. C. Colon, Ascending, Polyp, Polypectomy:   -Fragments of sessile serrated adenoma/polyp D. Colon, sigmoid, polyp, polypectomy:   -Fragments of Tubular Adenoma  -No  high grade dysplasia or malignancy. E. Rectum, Mass, Biopsy:   -INVASIVE COLONIC ADENOCARCINOMA, MODERATELY-DIFFERENTIATED, see comment     07/12/2019 Imaging   CT AP W Contrast 07/12/19  IMPRESSION: 1. No evidence of metastatic disease within the chest, abdomen or pelvis. 2. Possible mild wall thickening in the sigmoid colon, although no focal colonic mass is identified. There are few distal colonic diverticula. 3. Sequela of prior granulomatous disease. 4. Possible cholelithiasis with mild wall thickening of the gallbladder fundus. 5. Aortic Atherosclerosis (ICD10-I70.0).   08/21/2019 Initial Diagnosis   Rectal adenocarcinoma ([facility]  08/21/2019 Cancer Staging   Staging form: Colon and Rectum, AJCC 8th Edition - Clinical stage from 08/21/2019: Stage IIIB (cT3, cN1, cM0) - Signed by FeTruitt MerleMD on 08/21/2019   08/31/2019 -  Chemotherapy   FOLFOX q2weeks for 8 cycles starting  08/31/19      CURRENT THERAPY:  FOLFOX q2weeks starting 08/31/19. Removed 5FU bolus and increased steroids pre-meds due to thrombocytopenia and skin rash starting with C4.   INTERVAL HISTORY:  BrYECHIEL ERNYs here for a follow up and treatment. He presents to the clinic alone. He notes skin darkness of his neck after it was originally red without itching. This started on pump d/c day and darkness started 2 days ago. He denies any other implications of allergic reaction. He notes he did not sleep well this week knowing he was coming here. He notes he only has cold sensitivity for 1-2 days. He notes he also had jaw pain with the first bite of treatment.     REVIEW OF SYSTEMS:   Constitutional: Denies fevers, chills or abnormal weight loss Eyes: Denies  blurriness of vision Ears, nose, mouth, throat, and face: Denies mucositis or sore throat Respiratory: Denies cough, dyspnea or wheezes Cardiovascular: Denies palpitation, chest discomfort or lower extremity swelling Gastrointestinal:  Denies nausea,  heartburn or change in bowel habits Skin: Denies abnormal skin rashes (+) Recent rash of neck, now skin darkness Lymphatics: Denies new lymphadenopathy or easy bruising Neurological:Denies numbness, tingling or new weaknesses Behavioral/Psych: Mood is stable, no new changes (+) Nervousness All other systems were reviewed with the patient and are negative.  MEDICAL HISTORY:  Past Medical History:  Diagnosis Date  . Hypertension     SURGICAL HISTORY: Past Surgical History:  Procedure Laterality Date  . APPENDECTOMY     49 years old   . IR IMAGING GUIDED PORT INSERTION  08/30/2019  . SHOULDER SURGERY Bilateral 49 years old    I have reviewed the social history and family history with the patient and they are unchanged from previous note.  ALLERGIES:  has No Known Allergies.  MEDICATIONS:  Current Outpatient Medications  Medication Sig Dispense Refill  . amLODipine (NORVASC) 5 MG tablet Take by mouth.    Marland Kitchen atorvastatin (LIPITOR) 10 MG tablet Take by mouth.    Marland Kitchen BIKTARVY 50-200-25 MG TABS tablet TAKE 1 TABLET BY MOUTH ONCE DAILY 30 tablet 5  . lidocaine-prilocaine (EMLA) cream Apply to affected area once 30 g 3  . losartan (COZAAR) 25 MG tablet Take by mouth.    . Multiple Vitamin (MULTIVITAMIN) tablet Take 1 tablet by mouth daily.    . ondansetron (ZOFRAN) 8 MG tablet Take 1 tablet (8 mg total) by mouth 2 (two) times daily as needed for refractory nausea / vomiting. Start on day 3 after chemotherapy. 30 tablet 1  . prochlorperazine (COMPAZINE) 10 MG tablet Take 1 tablet (10 mg total) by mouth every 6 (six) hours as needed (Nausea or vomiting). 30 tablet 1   No current facility-administered medications for this visit.   Facility-Administered Medications Ordered in Other Visits  Medication Dose Route Frequency Provider Last Rate Last Admin  . fluorouracil (ADRUCIL) 5,300 mg in sodium chloride 0.9 % 144 mL chemo infusion  2,400 mg/m2 (Treatment Plan Recorded) Intravenous 1 day or 1  dose Truitt Merle, MD      . leucovorin 880 mg in dextrose 5 % 250 mL infusion  400 mg/m2 (Treatment Plan Recorded) Intravenous Once Truitt Merle, MD      . oxaliplatin (ELOXATIN) 185 mg in dextrose 5 % 500 mL chemo infusion  85 mg/m2 (Treatment Plan Recorded) Intravenous Once Truitt Merle, MD        PHYSICAL EXAMINATION: ECOG PERFORMANCE STATUS: 1 - Symptomatic but completely ambulatory  Vitals:   10/12/19 0842 10/12/19 0903  BP: (!) 144/97 (!) 137/94  Pulse: 77   Resp: 18   Temp: 97.9 F (36.6 C)   SpO2: 99%    Filed Weights   10/12/19 0842  Weight: 204 lb 9.6 oz (92.8 kg)    Due to COVID19 we will limit examination to appearance. Patient had no complaints.  GENERAL:alert, no distress and comfortable SKIN: no rashes or significant lesions (+) Skin darkness of anterior neck  EYES: normal, Conjunctiva are pink and non-injected, sclera clear  NEURO: alert & oriented x 3 with fluent speech   LABORATORY DATA:  I have reviewed the data as listed CBC Latest Ref Rng & Units 10/12/2019 09/28/2019 09/14/2019  WBC 4.0 - 10.5 K/uL 11.5(H) 9.3 6.7  Hemoglobin 13.0 - 17.0 g/dL 13.9 14.7 14.4  Hematocrit 39.0 -  52.0 % 39.6 40.6 39.5  Platelets 150 - 400 K/uL 115(L) 181 211     CMP Latest Ref Rng & Units 10/12/2019 09/28/2019 09/14/2019  Glucose 70 - 99 mg/dL 124(H) 109(H) 131(H)  BUN 6 - 20 mg/dL '10 11 13  '$ Creatinine 0.61 - 1.24 mg/dL 0.99 1.05 1.12  Sodium 135 - 145 mmol/L 141 142 141  Potassium 3.5 - 5.1 mmol/L 4.0 3.9 3.9  Chloride 98 - 111 mmol/L 107 108 107  CO2 22 - 32 mmol/L '26 25 25  '$ Calcium 8.9 - 10.3 mg/dL 9.2 9.1 9.0  Total Protein 6.5 - 8.1 g/dL 6.7 6.9 6.8  Total Bilirubin 0.3 - 1.2 mg/dL 0.7 0.7 0.6  Alkaline Phos 38 - 126 U/L 165(H) 132(H) 116  AST 15 - 41 U/L 39 48(H) 37  ALT 0 - 44 U/L 38 60(H) 56(H)      RADIOGRAPHIC STUDIES: I have personally reviewed the radiological images as listed and agreed with the findings in the report. No results found.   ASSESSMENT &  PLAN:  GREOGRY GOODWYN is a 49 y.o. male with    1.Low rectaladenocarcinoma,cT3N1M0,Stage IIIB, MMR normal -He presented with intermittent rectal bleeding for 2 years, but otherwise asymptomatic, this was discovered on screening colonoscopyin 06/2019. -Colonoscopy and scan shows he has low rectum mass which is close to sphincter. Biopsy from screening colonoscopy showed this is moderately-differentiated adenocarcinoma locally advanced. Based on MRI, this is cT3N1 stage III disease.  -I started him on total neoadjuvant chemotherapy with FOLFOXq2weeks for 8 cycles on 08/31/19. This will be followed by chemoRT before proceeding with curative surgery.  -S/p 3 he had skin rash of neck starting day of pump d/c which turned into skin darkness in the past 2 days. I will increase steroids in pre-meds. He also experienced 1-2 days of cold sensitivity and jaw pain.  -Labs reviewed, WBC 11.5, plt 115K, ANC 8.3. Given thrombocytopenia will remove 5-Fu bolus.  -Plan to order scan at next visit.  -F/u in 2 weeks  -He is fine to proceed with COVID19 vaccine on his off week of treatment. He is interested.    2. Rectal bleeding, secondary to #1 -Has been intermittent and stable for the last 2 years.  -He has had loose stool once daily lately.  -no anemia -Will monitor on neoadjuvant treatment.   3. HIV, controlled -He denies h/o or current use of recreational or IV drugs. He is unsure how he initially got infected.  -Controlled and undetectable. 09/15/18 CD4 was 350.His 10/09/19 CD4 T cells is 708  -He is on Biktavy. This is managed by Dr. Megan Salon.  -I will check for any drug interaction with his chemo.    4. HLD, HTN -He will continue medications and f/u with PCP  5. Gout  -Patient feels he has gout of right anterior foot. This did occur 2 months ago and went away on its own.  -I recommend he drink plenty of water and take ibuprofen.    PLAN: -Labs reviewed and adequate to  proceed with C4 FOLFOX todaywithout 5FU bolus (due to thrombocytopenia) and increased steroids pre-meds due to skin rash  -he will use benadryl as needed for skin rash and insomnia before chemo  -lab, flush, f/u and chemo FOLFOX in 2, 4, 6, 8 weeks -plan to order restaging scan on next visit, and will do after cycle 6    No problem-specific Assessment & Plan notes found for this encounter.   No orders of the defined types  were placed in this encounter.  All questions were answered. The patient knows to call the clinic with any problems, questions or concerns. No barriers to learning was detected. The total time spent in the appointment was 30 minutes.     Truitt Merle, MD 10/12/2019   I, Joslyn Devon, am acting as scribe for Truitt Merle, MD.   I have reviewed the above documentation for accuracy and completeness, and I agree with the above.

## 2019-10-09 ENCOUNTER — Other Ambulatory Visit: Payer: BC Managed Care – PPO

## 2019-10-09 ENCOUNTER — Other Ambulatory Visit: Payer: Self-pay

## 2019-10-09 DIAGNOSIS — B20 Human immunodeficiency virus [HIV] disease: Secondary | ICD-10-CM

## 2019-10-10 LAB — T-HELPER CELL (CD4) - (RCID CLINIC ONLY)
CD4 % Helper T Cell: 37 % (ref 33–65)
CD4 T Cell Abs: 708 /uL (ref 400–1790)

## 2019-10-11 LAB — HIV-1 RNA QUANT-NO REFLEX-BLD
HIV 1 RNA Quant: 39 copies/mL — ABNORMAL HIGH
HIV-1 RNA Quant, Log: 1.59 Log copies/mL — ABNORMAL HIGH

## 2019-10-12 ENCOUNTER — Encounter: Payer: Self-pay | Admitting: Hematology

## 2019-10-12 ENCOUNTER — Other Ambulatory Visit: Payer: Self-pay

## 2019-10-12 ENCOUNTER — Inpatient Hospital Stay: Payer: BC Managed Care – PPO

## 2019-10-12 ENCOUNTER — Inpatient Hospital Stay: Payer: BC Managed Care – PPO | Admitting: Hematology

## 2019-10-12 VITALS — BP 126/82 | HR 72

## 2019-10-12 VITALS — BP 137/94 | HR 77 | Temp 97.9°F | Resp 18 | Ht 70.0 in | Wt 204.6 lb

## 2019-10-12 DIAGNOSIS — I10 Essential (primary) hypertension: Secondary | ICD-10-CM | POA: Diagnosis not present

## 2019-10-12 DIAGNOSIS — E785 Hyperlipidemia, unspecified: Secondary | ICD-10-CM | POA: Diagnosis not present

## 2019-10-12 DIAGNOSIS — Z95828 Presence of other vascular implants and grafts: Secondary | ICD-10-CM

## 2019-10-12 DIAGNOSIS — D123 Benign neoplasm of transverse colon: Secondary | ICD-10-CM | POA: Diagnosis not present

## 2019-10-12 DIAGNOSIS — B20 Human immunodeficiency virus [HIV] disease: Secondary | ICD-10-CM

## 2019-10-12 DIAGNOSIS — D122 Benign neoplasm of ascending colon: Secondary | ICD-10-CM | POA: Diagnosis not present

## 2019-10-12 DIAGNOSIS — D125 Benign neoplasm of sigmoid colon: Secondary | ICD-10-CM | POA: Diagnosis not present

## 2019-10-12 DIAGNOSIS — R6884 Jaw pain: Secondary | ICD-10-CM | POA: Diagnosis not present

## 2019-10-12 DIAGNOSIS — G4700 Insomnia, unspecified: Secondary | ICD-10-CM | POA: Diagnosis not present

## 2019-10-12 DIAGNOSIS — Z5111 Encounter for antineoplastic chemotherapy: Secondary | ICD-10-CM | POA: Diagnosis not present

## 2019-10-12 DIAGNOSIS — C20 Malignant neoplasm of rectum: Secondary | ICD-10-CM | POA: Diagnosis not present

## 2019-10-12 DIAGNOSIS — I700 Atherosclerosis of aorta: Secondary | ICD-10-CM | POA: Diagnosis not present

## 2019-10-12 DIAGNOSIS — R21 Rash and other nonspecific skin eruption: Secondary | ICD-10-CM | POA: Diagnosis not present

## 2019-10-12 DIAGNOSIS — D696 Thrombocytopenia, unspecified: Secondary | ICD-10-CM | POA: Diagnosis not present

## 2019-10-12 DIAGNOSIS — D124 Benign neoplasm of descending colon: Secondary | ICD-10-CM | POA: Diagnosis not present

## 2019-10-12 DIAGNOSIS — K625 Hemorrhage of anus and rectum: Secondary | ICD-10-CM | POA: Diagnosis not present

## 2019-10-12 DIAGNOSIS — Z5189 Encounter for other specified aftercare: Secondary | ICD-10-CM | POA: Diagnosis not present

## 2019-10-12 HISTORY — DX: Presence of other vascular implants and grafts: Z95.828

## 2019-10-12 LAB — CMP (CANCER CENTER ONLY)
ALT: 38 U/L (ref 0–44)
AST: 39 U/L (ref 15–41)
Albumin: 3.4 g/dL — ABNORMAL LOW (ref 3.5–5.0)
Alkaline Phosphatase: 165 U/L — ABNORMAL HIGH (ref 38–126)
Anion gap: 8 (ref 5–15)
BUN: 10 mg/dL (ref 6–20)
CO2: 26 mmol/L (ref 22–32)
Calcium: 9.2 mg/dL (ref 8.9–10.3)
Chloride: 107 mmol/L (ref 98–111)
Creatinine: 0.99 mg/dL (ref 0.61–1.24)
GFR, Est AFR Am: >60 mL/min (ref >60)
GFR, Estimated: >60 mL/min (ref >60)
Glucose, Bld: 124 mg/dL — ABNORMAL HIGH (ref 70–99)
Potassium: 4 mmol/L (ref 3.5–5.1)
Sodium: 141 mmol/L (ref 135–145)
Total Bilirubin: 0.7 mg/dL (ref 0.3–1.2)
Total Protein: 6.7 g/dL (ref 6.5–8.1)

## 2019-10-12 LAB — CBC WITH DIFFERENTIAL (CANCER CENTER ONLY)
Abs Immature Granulocytes: 0.45 10*3/uL — ABNORMAL HIGH (ref 0.00–0.07)
Basophils Absolute: 0.1 10*3/uL (ref 0.0–0.1)
Basophils Relative: 1 %
Eosinophils Absolute: 0.1 10*3/uL (ref 0.0–0.5)
Eosinophils Relative: 1 %
HCT: 39.6 % (ref 39.0–52.0)
Hemoglobin: 13.9 g/dL (ref 13.0–17.0)
Immature Granulocytes: 4 %
Lymphocytes Relative: 14 %
Lymphs Abs: 1.6 10*3/uL (ref 0.7–4.0)
MCH: 33.4 pg (ref 26.0–34.0)
MCHC: 35.1 g/dL (ref 30.0–36.0)
MCV: 95.2 fL (ref 80.0–100.0)
Monocytes Absolute: 1 10*3/uL (ref 0.1–1.0)
Monocytes Relative: 9 %
Neutro Abs: 8.3 10*3/uL — ABNORMAL HIGH (ref 1.7–7.7)
Neutrophils Relative %: 71 %
Platelet Count: 115 10*3/uL — ABNORMAL LOW (ref 150–400)
RBC: 4.16 MIL/uL — ABNORMAL LOW (ref 4.22–5.81)
RDW: 14.2 % (ref 11.5–15.5)
WBC Count: 11.5 10*3/uL — ABNORMAL HIGH (ref 4.0–10.5)
nRBC: 0 % (ref 0.0–0.2)

## 2019-10-12 MED ORDER — SODIUM CHLORIDE 0.9 % IV SOLN
2400.0000 mg/m2 | INTRAVENOUS | Status: DC
  Administered 2019-10-12: 5300 mg via INTRAVENOUS
  Filled 2019-10-12: qty 106

## 2019-10-12 MED ORDER — PALONOSETRON HCL INJECTION 0.25 MG/5ML
INTRAVENOUS | Status: AC
  Filled 2019-10-12: qty 5

## 2019-10-12 MED ORDER — DEXAMETHASONE SODIUM PHOSPHATE 10 MG/ML IJ SOLN
INTRAMUSCULAR | Status: AC
  Filled 2019-10-12: qty 1

## 2019-10-12 MED ORDER — OXALIPLATIN CHEMO INJECTION 100 MG/20ML
85.0000 mg/m2 | Freq: Once | INTRAVENOUS | Status: AC
  Administered 2019-10-12: 185 mg via INTRAVENOUS
  Filled 2019-10-12: qty 37

## 2019-10-12 MED ORDER — SODIUM CHLORIDE 0.9% FLUSH
10.0000 mL | INTRAVENOUS | Status: DC | PRN
  Administered 2019-10-12: 10 mL
  Filled 2019-10-12: qty 10

## 2019-10-12 MED ORDER — PALONOSETRON HCL INJECTION 0.25 MG/5ML
0.2500 mg | Freq: Once | INTRAVENOUS | Status: AC
  Administered 2019-10-12: 0.25 mg via INTRAVENOUS

## 2019-10-12 MED ORDER — DEXAMETHASONE SODIUM PHOSPHATE 10 MG/ML IJ SOLN
15.0000 mg | Freq: Once | INTRAMUSCULAR | Status: AC
  Administered 2019-10-12: 15 mg via INTRAVENOUS

## 2019-10-12 MED ORDER — LEUCOVORIN CALCIUM INJECTION 350 MG
400.0000 mg/m2 | Freq: Once | INTRAVENOUS | Status: AC
  Administered 2019-10-12: 880 mg via INTRAVENOUS
  Filled 2019-10-12: qty 44

## 2019-10-12 MED ORDER — DEXTROSE 5 % IV SOLN
Freq: Once | INTRAVENOUS | Status: AC
  Filled 2019-10-12: qty 250

## 2019-10-12 NOTE — Patient Instructions (Addendum)
COVID-19 Vaccine Information can be found at: [email or web address] For questions related to vaccine distribution or appointments, please email [email or web address] or call [phone].   [facility] Discharge Instructions for Patients Receiving Chemotherapy  Today you received the following chemotherapy agents: Oxaliplatin (Eloxatin), Leucovorin, and Fluorouracil (Adrucil, 5-FU)  To help prevent nausea and vomiting after your treatment, we encourage you to take your nausea medication as directed by your provider.   If you develop nausea and vomiting that is not controlled by your nausea medication, call the clinic.   BELOW ARE SYMPTOMS THAT SHOULD BE REPORTED IMMEDIATELY:  *FEVER GREATER THAN 100.5 F  *CHILLS WITH OR WITHOUT FEVER  NAUSEA AND VOMITING THAT IS NOT CONTROLLED WITH YOUR NAUSEA MEDICATION  *UNUSUAL SHORTNESS OF BREATH  *UNUSUAL BRUISING OR BLEEDING  TENDERNESS IN MOUTH AND THROAT WITH OR WITHOUT PRESENCE OF ULCERS  *URINARY PROBLEMS  *BOWEL PROBLEMS  UNUSUAL RASH Items with * indicate a potential emergency and should be followed up as soon as possible.  Feel free to call the clinic should you have any questions or concerns. The clinic phone number is [phone].  Please show the [facility] at check-in to the Emergency Department and triage nurse.

## 2019-10-14 ENCOUNTER — Other Ambulatory Visit: Payer: Self-pay

## 2019-10-14 ENCOUNTER — Inpatient Hospital Stay: Payer: BC Managed Care – PPO

## 2019-10-14 VITALS — BP 137/76 | HR 71 | Temp 98.7°F | Resp 18

## 2019-10-14 DIAGNOSIS — C20 Malignant neoplasm of rectum: Secondary | ICD-10-CM | POA: Diagnosis not present

## 2019-10-14 DIAGNOSIS — R6884 Jaw pain: Secondary | ICD-10-CM | POA: Diagnosis not present

## 2019-10-14 DIAGNOSIS — D125 Benign neoplasm of sigmoid colon: Secondary | ICD-10-CM | POA: Diagnosis not present

## 2019-10-14 DIAGNOSIS — R21 Rash and other nonspecific skin eruption: Secondary | ICD-10-CM | POA: Diagnosis not present

## 2019-10-14 DIAGNOSIS — G4700 Insomnia, unspecified: Secondary | ICD-10-CM | POA: Diagnosis not present

## 2019-10-14 DIAGNOSIS — D122 Benign neoplasm of ascending colon: Secondary | ICD-10-CM | POA: Diagnosis not present

## 2019-10-14 DIAGNOSIS — Z5189 Encounter for other specified aftercare: Secondary | ICD-10-CM | POA: Diagnosis not present

## 2019-10-14 DIAGNOSIS — I10 Essential (primary) hypertension: Secondary | ICD-10-CM | POA: Diagnosis not present

## 2019-10-14 DIAGNOSIS — I700 Atherosclerosis of aorta: Secondary | ICD-10-CM | POA: Diagnosis not present

## 2019-10-14 DIAGNOSIS — E785 Hyperlipidemia, unspecified: Secondary | ICD-10-CM | POA: Diagnosis not present

## 2019-10-14 DIAGNOSIS — B20 Human immunodeficiency virus [HIV] disease: Secondary | ICD-10-CM | POA: Diagnosis not present

## 2019-10-14 DIAGNOSIS — Z5111 Encounter for antineoplastic chemotherapy: Secondary | ICD-10-CM | POA: Diagnosis not present

## 2019-10-14 DIAGNOSIS — K625 Hemorrhage of anus and rectum: Secondary | ICD-10-CM | POA: Diagnosis not present

## 2019-10-14 DIAGNOSIS — D696 Thrombocytopenia, unspecified: Secondary | ICD-10-CM | POA: Diagnosis not present

## 2019-10-14 DIAGNOSIS — D124 Benign neoplasm of descending colon: Secondary | ICD-10-CM | POA: Diagnosis not present

## 2019-10-14 DIAGNOSIS — D123 Benign neoplasm of transverse colon: Secondary | ICD-10-CM | POA: Diagnosis not present

## 2019-10-14 MED ORDER — PEGFILGRASTIM-CBQV 6 MG/0.6ML ~~LOC~~ SOSY
6.0000 mg | PREFILLED_SYRINGE | Freq: Once | SUBCUTANEOUS | Status: AC
  Administered 2019-10-14: 6 mg via SUBCUTANEOUS

## 2019-10-14 MED ORDER — PEGFILGRASTIM-CBQV 6 MG/0.6ML ~~LOC~~ SOSY
PREFILLED_SYRINGE | SUBCUTANEOUS | Status: AC
  Filled 2019-10-14: qty 0.6

## 2019-10-14 MED ORDER — HEPARIN SOD (PORK) LOCK FLUSH 100 UNIT/ML IV SOLN
500.0000 [IU] | Freq: Once | INTRAVENOUS | Status: AC | PRN
  Administered 2019-10-14: 10:00:00 500 [IU]
  Filled 2019-10-14: qty 5

## 2019-10-14 MED ORDER — SODIUM CHLORIDE 0.9% FLUSH
10.0000 mL | INTRAVENOUS | Status: DC | PRN
  Administered 2019-10-14: 10 mL
  Filled 2019-10-14: qty 10

## 2019-10-18 ENCOUNTER — Other Ambulatory Visit: Payer: Self-pay | Admitting: Radiation Oncology

## 2019-10-19 NOTE — Progress Notes (Signed)
Pharmacist Chemotherapy Monitoring - Follow Up Assessment    I verify that I have reviewed each item in the below checklist:  . Regimen for the patient is scheduled for the appropriate day and plan matches scheduled date. Marland Kitchen Appropriate non-routine labs are ordered dependent on drug ordered. . If applicable, additional medications reviewed and ordered per protocol based on lifetime cumulative doses and/or treatment regimen.   Plan for follow-up and/or issues identified: No . I-vent associated with next due treatment: No . MD and/or nursing notified: No  Julias Mould, Jacqlyn Larsen 10/19/2019 3:51 PM

## 2019-10-23 NOTE — Progress Notes (Signed)
[place]   Telephone:(336) [phone] Fax:([phone]   Clinic Follow up Note   Patient Care Team: Merwyn Katos as PCP - General (Physician Assistant) Michel Bickers, MD as Consulting Physician (Infectious Diseases) Leighton Ruff, MD as Consulting Physician (General Surgery) Truitt Merle, MD as Consulting Physician (Hematology) Kyung Rudd, MD as Consulting Physician (Radiation Oncology) Jonnie Finner, RN as Oncology Nurse Navigator  Date of Service:  10/26/2019  CHIEF COMPLAINT: F/u of rectal cancer  SUMMARY OF ONCOLOGIC HISTORY: Oncology History Overview Note  Cancer Staging Rectal adenocarcinoma [facility]) Staging form: Colon and Rectum, AJCC 8th Edition - Clinical stage from 08/21/2019: Stage IIIB (cT3, cN1, cM0) - Signed by Truitt Merle, MD on 08/21/2019    Rectal adenocarcinoma ([place])  07/03/2019 Procedure   Colonoscopy by Dr. Collene Mares  IMPRESSION -Likely Malignant 5cm, circumferential, bleeding tumor in the rectum, 304 cm from the anal verge -biopsy done  -One 8 mm sessile polyp in the distal sigmoid colon, removed with a hot snare x2; resected and retrieved.  -Two small sessile polyps, 1 in the mid-descending colon and 1 in the mid transverse colon - removed by cold biopsies.  -One 25m sessile polyp in the mid transverse colon, removed with a hot snare x1; resected and retrieved.  -One 841msessile polyp in the proximal ascending colon, removed with a hot snare X1; resected and retrieved.  -few large scattered diverticula.   07/03/2019 Initial Biopsy   FINAL DIAGNOSIS 07/03/19  A. Colon, Descending, Polyp, Polypectomy:   -TUBULAR ADENOMA   -No high grade dysplasia or malignancy.  B. Colon, transverse, polyp, polypectomy:   -TUBULAR ADENOMA  -No high grade dysplasia or malignancy. C. Colon, Ascending, Polyp, Polypectomy:   -Fragments of sessile serrated adenoma/polyp D. Colon, sigmoid, polyp, polypectomy:   -Fragments of Tubular Adenoma  -No  high grade dysplasia or malignancy. E. Rectum, Mass, Biopsy:   -INVASIVE COLONIC ADENOCARCINOMA, MODERATELY-DIFFERENTIATED, see comment     07/12/2019 Imaging   CT AP W Contrast 07/12/19  IMPRESSION: 1. No evidence of metastatic disease within the chest, abdomen or pelvis. 2. Possible mild wall thickening in the sigmoid colon, although no focal colonic mass is identified. There are few distal colonic diverticula. 3. Sequela of prior granulomatous disease. 4. Possible cholelithiasis with mild wall thickening of the gallbladder fundus. 5. Aortic Atherosclerosis (ICD10-I70.0).   08/21/2019 Initial Diagnosis   [place]al adenocarcinoma [place]  08/21/2019 Cancer Staging   Staging form: Colon and Rectum, AJCC 8th Edition - Clinical stage from 08/21/2019: Stage IIIB (cT3, cN1, cM0) - Signed by FeTruitt MerleMD on 08/21/2019   08/31/2019 -  Chemotherapy   FOLFOX q2weeks for 8 cycles starting  08/31/19      CURRENT THERAPY:  FOLFOX q2weeks starting 08/31/19. Removed 5FU bolus and increased steroids pre-meds due to thrombocytopenia and skin rash starting with C4.  INTERVAL HISTORY:  Joshua Zhang here for a follow up and treatment. He presents to the clinic alone. HE notes he is doing well. He has tolerated last cycle well. He notes increased cold sensitivity but no neuropathy. He denies fever, chills, chest discomfort. His weight is stable, although his appetite fluctuates.     REVIEW OF SYSTEMS:   Constitutional: Denies fevers, chills or abnormal weight loss Eyes: Denies blurriness of vision Ears, nose, mouth, throat, and face: Denies mucositis or sore throat Respiratory: Denies cough, dyspnea or wheezes Cardiovascular: Denies palpitation, chest discomfort or lower extremity swelling Gastrointestinal:  Denies nausea, heartburn or change in bowel  habits Skin: Denies abnormal skin rashes Lymphatics: Denies new lymphadenopathy or easy bruising Neurological:Denies numbness, tingling or new  weaknesses Behavioral/Psych: Mood is stable, no new changes  All other systems were reviewed with the patient and are negative.  MEDICAL HISTORY:  Past Medical History:  Diagnosis Date  . Hypertension     SURGICAL HISTORY: Past Surgical History:  Procedure Laterality Date  . APPENDECTOMY     49 years old   . IR IMAGING GUIDED PORT INSERTION  08/30/2019  . SHOULDER SURGERY Bilateral 49 years old    I have reviewed the social history and family history with the patient and they are unchanged from previous note.  ALLERGIES:  has No Known Allergies.  MEDICATIONS:  Current Outpatient Medications  Medication Sig Dispense Refill  . amLODipine (NORVASC) 5 MG tablet Take by mouth.    Marland Kitchen atorvastatin (LIPITOR) 10 MG tablet Take by mouth.    Marland Kitchen BIKTARVY 50-200-25 MG TABS tablet TAKE 1 TABLET BY MOUTH ONCE DAILY 30 tablet 5  . lidocaine-prilocaine (EMLA) cream Apply to affected area once 30 g 3  . losartan (COZAAR) 25 MG tablet Take by mouth.    . Multiple Vitamin (MULTIVITAMIN) tablet Take 1 tablet by mouth daily.    . ondansetron (ZOFRAN) 8 MG tablet Take 1 tablet (8 mg total) by mouth 2 (two) times daily as needed for refractory nausea / vomiting. Start on day 3 after chemotherapy. 30 tablet 1  . prochlorperazine (COMPAZINE) 10 MG tablet Take 1 tablet (10 mg total) by mouth every 6 (six) hours as needed (Nausea or vomiting). 30 tablet 1   No current facility-administered medications for this visit.   Facility-Administered Medications Ordered in Other Visits  Medication Dose Route Frequency Provider Last Rate Last Admin  . fluorouracil (ADRUCIL) 5,300 mg in sodium chloride 0.9 % 144 mL chemo infusion  2,400 mg/m2 (Treatment Plan Recorded) Intravenous 1 day or 1 dose Truitt Merle, MD      . leucovorin 880 mg in dextrose 5 % 250 mL infusion  400 mg/m2 (Treatment Plan Recorded) Intravenous Once Truitt Merle, MD      . oxaliplatin (ELOXATIN) 185 mg in dextrose 5 % 500 mL chemo infusion  85 mg/m2  (Treatment Plan Recorded) Intravenous Once Truitt Merle, MD        PHYSICAL EXAMINATION: ECOG PERFORMANCE STATUS: 1 - Symptomatic but completely ambulatory  Vitals:   10/26/19 0827  BP: (!) 152/93  Pulse: 92  Resp: 18  Temp: 97.8 F (36.6 C)  SpO2: 98%   Filed Weights   10/26/19 0827  Weight: 203 lb 9.6 oz (92.4 kg)    Due to COVID19 we will limit examination to appearance. Patient had no complaints.  GENERAL:alert, no distress and comfortable SKIN: skin color normal, no rashes or significant lesions EYES: normal, Conjunctiva are pink and non-injected, sclera clear  NEURO: alert & oriented x 3 with fluent speech   LABORATORY DATA:  I have reviewed the data as listed CBC Latest Ref Rng & Units 10/26/2019 10/12/2019 09/28/2019  WBC 4.0 - 10.5 K/uL 10.6(H) 11.5(H) 9.3  Hemoglobin 13.0 - 17.0 g/dL 13.7 13.9 14.7  Hematocrit 39.0 - 52.0 % 38.6(L) 39.6 40.6  Platelets 150 - 400 K/uL 117(L) 115(L) 181     CMP Latest Ref Rng & Units 10/26/2019 10/12/2019 09/28/2019  Glucose 70 - 99 mg/dL 104(H) 124(H) 109(H)  BUN 6 - 20 mg/dL _0 Creatinine 0.61 - 1.24 mg/dL 0.88 0.99 1.05  Sodium 135 - 145  mmol/L 140 141 142  Potassium 3.5 - 5.1 mmol/L 3.9 4.0 3.9  Chloride 98 - 111 mmol/L 108 107 108  CO2 22 - 32 mmol/L _0 Calcium 8.9 - 10.3 mg/dL 9.1 9.2 9.1  Total Protein 6.5 - 8.1 g/dL 6.7 6.7 6.9  Total Bilirubin 0.3 - 1.2 mg/dL 0.7 0.7 0.7  Alkaline Phos 38 - 126 U/L 157(H) 165(H) 132(H)  AST 15 - 41 U/L 40 39 48(H)  ALT 0 - 44 U/L 37 38 60(H)      RADIOGRAPHIC STUDIES: I have personally reviewed the radiological images as listed and agreed with the findings in the report. No results found.   ASSESSMENT & PLAN:  Joshua Zhang is a 49 y.o. male with    1.Low rectaladenocarcinoma,cT3N1M0,Stage IIIB, MMR normal -He presented with intermittent rectal bleeding for 2 years, but otherwise asymptomatic, this was discovered on screening colonoscopyin  06/2019. -Colonoscopy and scan shows he has low rectum mass which is close to sphincter. Biopsy from screening colonoscopy showed this is moderately-differentiated adenocarcinoma locally advanced. Based on MRI, this is cT3N1 stage III disease.  -Istarted him ontotal neoadjuvant chemotherapywithFOLFOXq2weeks for 8 cycles on 08/31/19.This will be followed bychemoRT before proceeding with curative surgery. 5FU bolus was removed starting with C4 due to thrombocytopenia. -S/p C4 he is tolerating well with more cold sensitivity and mild thrombocytopenia.  -Labs reviewed, WBC 106, plt 117K, ANC 7.9. Overall adequate to proceed with C5 FOLFOX today, same dose.  -Plan to scan him after C6.  -F/u in 2 weeks    2. Rectal bleeding, secondary to #1 -Has been intermittent and stable for the last 2 years.  -He has had loose stool once daily lately. -no anemia -Will monitor on neoadjuvant treatment.   3. HIV, controlled -He denies h/o or current use of recreational or IV drugs. He is unsure how he initially got infected.  -Controlled and undetectable. 09/15/18 CD4 was 350.His 10/09/19 CD4 T cells is 708  -He is on Biktavy. This is managed by Dr. Megan Salon.  -I will check for any drug interaction with his chemo.    4. HLD, HTN -He will continue medications and f/u with PCP -His BP is elevated in clinic but per pt normal at home. I encouraged him to continue to monitor and continue medications.   5. Gout  -Patient feels he has gout of right anterior foot. This did occur 2 months ago and went away on its own.  -I recommend he drink plenty of water and take ibuprofen.   PLAN: -Labs reviewed and adequate to proceed with C5FOLFOX today, same dose -lab, flush, f/u and chemo FOLFOX in 2,4, 6 weeks -restaging CT  AP w contrast after cycle 6    No problem-specific Assessment & Plan notes found for this encounter.   Orders Placed This Encounter  Procedures  . CT Abdomen Pelvis W  Contrast    Standing Status:   Future    Standing Expiration Date:   10/25/2020    Order Specific Question:   If indicated for the ordered procedure, I authorize the administration of contrast media per Radiology protocol    Answer:   Yes    Order Specific Question:   Preferred imaging location?    Answer:   [facility]    Order Specific Question:   Is Oral Contrast requested for this exam?    Answer:   Yes, Per Radiology protocol    Order Specific Question:   Radiology Contrast Protocol -  do NOT remove file path    Answer:   [email or web address]   All questions were answered. The patient knows to call the clinic with any problems, questions or concerns. No barriers to learning was detected. The total time spent in the appointment was 30 minutes.     Truitt Merle, MD 10/26/2019   I, Joslyn Devon, am acting as scribe for Truitt Merle, MD.   I have reviewed the above documentation for accuracy and completeness, and I agree with the above.

## 2019-10-24 MED FILL — BIKTARVY 50-200-25 MG TABS: 50-200-25 | 30 days supply | Qty: 30 | Fill #4

## 2019-10-26 ENCOUNTER — Inpatient Hospital Stay: Payer: BC Managed Care – PPO

## 2019-10-26 ENCOUNTER — Encounter: Payer: Self-pay | Admitting: Hematology

## 2019-10-26 ENCOUNTER — Telehealth: Payer: Self-pay | Admitting: Hematology

## 2019-10-26 ENCOUNTER — Inpatient Hospital Stay: Payer: BC Managed Care – PPO | Attending: Hematology

## 2019-10-26 ENCOUNTER — Other Ambulatory Visit: Payer: Self-pay

## 2019-10-26 ENCOUNTER — Inpatient Hospital Stay: Payer: BC Managed Care – PPO | Admitting: Hematology

## 2019-10-26 VITALS — BP 152/93 | HR 92 | Temp 97.8°F | Resp 18 | Ht 70.0 in | Wt 203.6 lb

## 2019-10-26 VITALS — BP 143/85

## 2019-10-26 DIAGNOSIS — C20 Malignant neoplasm of rectum: Secondary | ICD-10-CM | POA: Diagnosis not present

## 2019-10-26 DIAGNOSIS — I700 Atherosclerosis of aorta: Secondary | ICD-10-CM | POA: Insufficient documentation

## 2019-10-26 DIAGNOSIS — E785 Hyperlipidemia, unspecified: Secondary | ICD-10-CM | POA: Diagnosis not present

## 2019-10-26 DIAGNOSIS — D696 Thrombocytopenia, unspecified: Secondary | ICD-10-CM | POA: Insufficient documentation

## 2019-10-26 DIAGNOSIS — D122 Benign neoplasm of ascending colon: Secondary | ICD-10-CM | POA: Diagnosis not present

## 2019-10-26 DIAGNOSIS — B20 Human immunodeficiency virus [HIV] disease: Secondary | ICD-10-CM | POA: Diagnosis not present

## 2019-10-26 DIAGNOSIS — I10 Essential (primary) hypertension: Secondary | ICD-10-CM | POA: Insufficient documentation

## 2019-10-26 DIAGNOSIS — R21 Rash and other nonspecific skin eruption: Secondary | ICD-10-CM | POA: Insufficient documentation

## 2019-10-26 DIAGNOSIS — Z5111 Encounter for antineoplastic chemotherapy: Secondary | ICD-10-CM | POA: Insufficient documentation

## 2019-10-26 DIAGNOSIS — Z79899 Other long term (current) drug therapy: Secondary | ICD-10-CM | POA: Insufficient documentation

## 2019-10-26 DIAGNOSIS — K5730 Diverticulosis of large intestine without perforation or abscess without bleeding: Secondary | ICD-10-CM | POA: Insufficient documentation

## 2019-10-26 DIAGNOSIS — D124 Benign neoplasm of descending colon: Secondary | ICD-10-CM | POA: Insufficient documentation

## 2019-10-26 DIAGNOSIS — D123 Benign neoplasm of transverse colon: Secondary | ICD-10-CM | POA: Diagnosis not present

## 2019-10-26 DIAGNOSIS — M109 Gout, unspecified: Secondary | ICD-10-CM | POA: Insufficient documentation

## 2019-10-26 DIAGNOSIS — Z95828 Presence of other vascular implants and grafts: Secondary | ICD-10-CM

## 2019-10-26 DIAGNOSIS — D125 Benign neoplasm of sigmoid colon: Secondary | ICD-10-CM | POA: Insufficient documentation

## 2019-10-26 DIAGNOSIS — Z5189 Encounter for other specified aftercare: Secondary | ICD-10-CM | POA: Insufficient documentation

## 2019-10-26 LAB — CMP (CANCER CENTER ONLY)
ALT: 37 U/L (ref 0–44)
AST: 40 U/L (ref 15–41)
Albumin: 3.4 g/dL — ABNORMAL LOW (ref 3.5–5.0)
Alkaline Phosphatase: 157 U/L — ABNORMAL HIGH (ref 38–126)
Anion gap: 9 (ref 5–15)
BUN: 12 mg/dL (ref 6–20)
CO2: 23 mmol/L (ref 22–32)
Calcium: 9.1 mg/dL (ref 8.9–10.3)
Chloride: 108 mmol/L (ref 98–111)
Creatinine: 0.88 mg/dL (ref 0.61–1.24)
GFR, Est AFR Am: >60 mL/min (ref >60)
GFR, Estimated: >60 mL/min (ref >60)
Glucose, Bld: 104 mg/dL — ABNORMAL HIGH (ref 70–99)
Potassium: 3.9 mmol/L (ref 3.5–5.1)
Sodium: 140 mmol/L (ref 135–145)
Total Bilirubin: 0.7 mg/dL (ref 0.3–1.2)
Total Protein: 6.7 g/dL (ref 6.5–8.1)

## 2019-10-26 LAB — CBC WITH DIFFERENTIAL (CANCER CENTER ONLY)
Abs Immature Granulocytes: 0.23 10*3/uL — ABNORMAL HIGH (ref 0.00–0.07)
Basophils Absolute: 0.1 10*3/uL (ref 0.0–0.1)
Basophils Relative: 1 %
Eosinophils Absolute: 0.1 10*3/uL (ref 0.0–0.5)
Eosinophils Relative: 1 %
HCT: 38.6 % — ABNORMAL LOW (ref 39.0–52.0)
Hemoglobin: 13.7 g/dL (ref 13.0–17.0)
Immature Granulocytes: 2 %
Lymphocytes Relative: 13 %
Lymphs Abs: 1.3 10*3/uL (ref 0.7–4.0)
MCH: 34.7 pg — ABNORMAL HIGH (ref 26.0–34.0)
MCHC: 35.5 g/dL (ref 30.0–36.0)
MCV: 97.7 fL (ref 80.0–100.0)
Monocytes Absolute: 0.9 10*3/uL (ref 0.1–1.0)
Monocytes Relative: 9 %
Neutro Abs: 7.9 10*3/uL — ABNORMAL HIGH (ref 1.7–7.7)
Neutrophils Relative %: 74 %
Platelet Count: 117 10*3/uL — ABNORMAL LOW (ref 150–400)
RBC: 3.95 MIL/uL — ABNORMAL LOW (ref 4.22–5.81)
RDW: 14.9 % (ref 11.5–15.5)
WBC Count: 10.6 10*3/uL — ABNORMAL HIGH (ref 4.0–10.5)
nRBC: 0 % (ref 0.0–0.2)

## 2019-10-26 MED ORDER — LEUCOVORIN CALCIUM INJECTION 350 MG
400.0000 mg/m2 | Freq: Once | INTRAVENOUS | Status: AC
  Administered 2019-10-26: 880 mg via INTRAVENOUS
  Filled 2019-10-26: qty 44

## 2019-10-26 MED ORDER — PALONOSETRON HCL INJECTION 0.25 MG/5ML
0.2500 mg | Freq: Once | INTRAVENOUS | Status: AC
  Administered 2019-10-26: 0.25 mg via INTRAVENOUS

## 2019-10-26 MED ORDER — PALONOSETRON HCL INJECTION 0.25 MG/5ML
INTRAVENOUS | Status: AC
  Filled 2019-10-26: qty 5

## 2019-10-26 MED ORDER — SODIUM CHLORIDE 0.9% FLUSH
10.0000 mL | INTRAVENOUS | Status: DC | PRN
  Administered 2019-10-26: 10 mL
  Filled 2019-10-26: qty 10

## 2019-10-26 MED ORDER — SODIUM CHLORIDE 0.9 % IV SOLN
15.0000 mg | Freq: Once | INTRAVENOUS | Status: AC
  Administered 2019-10-26: 15 mg via INTRAVENOUS
  Filled 2019-10-26: qty 1.5

## 2019-10-26 MED ORDER — OXALIPLATIN CHEMO INJECTION 100 MG/20ML
85.0000 mg/m2 | Freq: Once | INTRAVENOUS | Status: AC
  Administered 2019-10-26: 185 mg via INTRAVENOUS
  Filled 2019-10-26: qty 37

## 2019-10-26 MED ORDER — DEXTROSE 5 % IV SOLN
Freq: Once | INTRAVENOUS | Status: AC
  Filled 2019-10-26: qty 250

## 2019-10-26 MED ORDER — SODIUM CHLORIDE 0.9 % IV SOLN
2400.0000 mg/m2 | INTRAVENOUS | Status: DC
  Administered 2019-10-26: 5300 mg via INTRAVENOUS
  Filled 2019-10-26: qty 106

## 2019-10-26 NOTE — Telephone Encounter (Signed)
Scheduled appt per 4/8 los. °

## 2019-10-26 NOTE — Patient Instructions (Signed)
[facility] Discharge Instructions for Patients Receiving Chemotherapy  Today you received the following chemotherapy agents: oxaliplatin, leucovorin, and fluorouracil.  To help prevent nausea and vomiting after your treatment, we encourage you to take your nausea medication as directed.   If you develop nausea and vomiting that is not controlled by your nausea medication, call the clinic.   BELOW ARE SYMPTOMS THAT SHOULD BE REPORTED IMMEDIATELY:  *FEVER GREATER THAN 100.5 F  *CHILLS WITH OR WITHOUT FEVER  NAUSEA AND VOMITING THAT IS NOT CONTROLLED WITH YOUR NAUSEA MEDICATION  *UNUSUAL SHORTNESS OF BREATH  *UNUSUAL BRUISING OR BLEEDING  TENDERNESS IN MOUTH AND THROAT WITH OR WITHOUT PRESENCE OF ULCERS  *URINARY PROBLEMS  *BOWEL PROBLEMS  UNUSUAL RASH Items with * indicate a potential emergency and should be followed up as soon as possible.  Feel free to call the clinic should you have any questions or concerns. The clinic phone number is [phone].  Please show the CHEMO ALERT CARD at check-in to the Emergency Department and triage nurse.   

## 2019-10-28 ENCOUNTER — Inpatient Hospital Stay: Payer: BC Managed Care – PPO

## 2019-10-28 ENCOUNTER — Other Ambulatory Visit: Payer: Self-pay

## 2019-10-28 VITALS — BP 137/92 | HR 61 | Temp 98.9°F | Resp 18

## 2019-10-28 DIAGNOSIS — D696 Thrombocytopenia, unspecified: Secondary | ICD-10-CM | POA: Diagnosis not present

## 2019-10-28 DIAGNOSIS — K5730 Diverticulosis of large intestine without perforation or abscess without bleeding: Secondary | ICD-10-CM | POA: Diagnosis not present

## 2019-10-28 DIAGNOSIS — R21 Rash and other nonspecific skin eruption: Secondary | ICD-10-CM | POA: Diagnosis not present

## 2019-10-28 DIAGNOSIS — D124 Benign neoplasm of descending colon: Secondary | ICD-10-CM | POA: Diagnosis not present

## 2019-10-28 DIAGNOSIS — Z5111 Encounter for antineoplastic chemotherapy: Secondary | ICD-10-CM | POA: Diagnosis not present

## 2019-10-28 DIAGNOSIS — D122 Benign neoplasm of ascending colon: Secondary | ICD-10-CM | POA: Diagnosis not present

## 2019-10-28 DIAGNOSIS — Z5189 Encounter for other specified aftercare: Secondary | ICD-10-CM | POA: Diagnosis not present

## 2019-10-28 DIAGNOSIS — E785 Hyperlipidemia, unspecified: Secondary | ICD-10-CM | POA: Diagnosis not present

## 2019-10-28 DIAGNOSIS — I700 Atherosclerosis of aorta: Secondary | ICD-10-CM | POA: Diagnosis not present

## 2019-10-28 DIAGNOSIS — I10 Essential (primary) hypertension: Secondary | ICD-10-CM | POA: Diagnosis not present

## 2019-10-28 DIAGNOSIS — D125 Benign neoplasm of sigmoid colon: Secondary | ICD-10-CM | POA: Diagnosis not present

## 2019-10-28 DIAGNOSIS — M109 Gout, unspecified: Secondary | ICD-10-CM | POA: Diagnosis not present

## 2019-10-28 DIAGNOSIS — C20 Malignant neoplasm of rectum: Secondary | ICD-10-CM | POA: Diagnosis not present

## 2019-10-28 DIAGNOSIS — Z79899 Other long term (current) drug therapy: Secondary | ICD-10-CM | POA: Diagnosis not present

## 2019-10-28 DIAGNOSIS — D123 Benign neoplasm of transverse colon: Secondary | ICD-10-CM | POA: Diagnosis not present

## 2019-10-28 MED ORDER — PEGFILGRASTIM-CBQV 6 MG/0.6ML ~~LOC~~ SOSY
6.0000 mg | PREFILLED_SYRINGE | Freq: Once | SUBCUTANEOUS | Status: AC
  Administered 2019-10-28: 6 mg via SUBCUTANEOUS

## 2019-10-28 MED ORDER — SODIUM CHLORIDE 0.9% FLUSH
10.0000 mL | INTRAVENOUS | Status: DC | PRN
  Administered 2019-10-28: 10 mL
  Filled 2019-10-28: qty 10

## 2019-10-28 MED ORDER — HEPARIN SOD (PORK) LOCK FLUSH 100 UNIT/ML IV SOLN
500.0000 [IU] | Freq: Once | INTRAVENOUS | Status: AC | PRN
  Administered 2019-10-28: 11:00:00 500 [IU]
  Filled 2019-10-28: qty 5

## 2019-10-28 NOTE — Patient Instructions (Signed)

## 2019-10-29 DIAGNOSIS — C20 Malignant neoplasm of rectum: Secondary | ICD-10-CM | POA: Diagnosis not present

## 2019-11-03 NOTE — Progress Notes (Signed)
Pharmacist Chemotherapy Monitoring - Follow Up Assessment    I verify that I have reviewed each item in the below checklist:  . Regimen for the patient is scheduled for the appropriate day and plan matches scheduled date. Marland Kitchen Appropriate non-routine labs are ordered dependent on drug ordered. . If applicable, additional medications reviewed and ordered per protocol based on lifetime cumulative doses and/or treatment regimen.   Plan for follow-up and/or issues identified: No . I-vent associated with next due treatment: No . MD and/or nursing notified: No  Joshua Zhang 11/03/2019 2:25 PM

## 2019-11-06 NOTE — Progress Notes (Signed)
Joshua Zhang   Telephone:(336) [phone] Fax:(336) [phone]   Clinic Follow up Note   Patient Care Team: Merwyn Katos as PCP - General (Physician Assistant) Michel Bickers, MD as Consulting Physician (Infectious Diseases) Leighton Ruff, MD as Consulting Physician (General Surgery) Truitt Merle, MD as Consulting Physician (Hematology) Kyung Rudd, MD as Consulting Physician (Radiation Oncology) Jonnie Finner, RN as Oncology Nurse Navigator  Date of Service:  11/09/2019  CHIEF COMPLAINT: F/u of rectal cancer  SUMMARY OF ONCOLOGIC HISTORY: Oncology History Overview Note  Cancer Staging Rectal adenocarcinoma [facility]) Staging form: Colon and Rectum, AJCC 8th Edition - Clinical stage from 08/21/2019: Stage IIIB (cT3, cN1, cM0) - Signed by Truitt Merle, MD on 08/21/2019    Rectal adenocarcinoma ([place])  07/03/2019 Procedure   Colonoscopy by Dr. Collene Mares  IMPRESSION -Likely Malignant 5cm, circumferential, bleeding tumor in the rectum, 304 cm from the anal verge -biopsy done  -One 8 mm sessile polyp in the distal sigmoid colon, removed with a hot snare x2; resected and retrieved.  -Two small sessile polyps, 1 in the mid-descending colon and 1 in the mid transverse colon - removed by cold biopsies.  -One 21m sessile polyp in the mid transverse colon, removed with a hot snare x1; resected and retrieved.  -One 810msessile polyp in the proximal ascending colon, removed with a hot snare X1; resected and retrieved.  -few large scattered diverticula.   07/03/2019 Initial Biopsy   FINAL DIAGNOSIS 07/03/19  A. Colon, Descending, Polyp, Polypectomy:   -TUBULAR ADENOMA   -No high grade dysplasia or malignancy.  B. Colon, transverse, polyp, polypectomy:   -TUBULAR ADENOMA  -No high grade dysplasia or malignancy. C. Colon, Ascending, Polyp, Polypectomy:   -Fragments of sessile serrated adenoma/polyp D. Colon, sigmoid, polyp, polypectomy:   -Fragments of Tubular Adenoma  -No  high grade dysplasia or malignancy. E. Rectum, Mass, Biopsy:   -INVASIVE COLONIC ADENOCARCINOMA, MODERATELY-DIFFERENTIATED, see comment     07/12/2019 Imaging   CT AP W Contrast 07/12/19  IMPRESSION: 1. No evidence of metastatic disease within the chest, abdomen or pelvis. 2. Possible mild wall thickening in the sigmoid colon, although no focal colonic mass is identified. There are few distal colonic diverticula. 3. Sequela of prior granulomatous disease. 4. Possible cholelithiasis with mild wall thickening of the gallbladder fundus. 5. Aortic Atherosclerosis (ICD10-I70.0).   08/21/2019 Initial Diagnosis   [place]  08/21/2019 Cancer Staging   Staging form: Colon and Rectum, AJCC 8th Edition - Clinical stage from 08/21/2019: Stage IIIB (cT3, cN1, cM0) - Signed by FeTruitt MerleMD on 08/21/2019   08/31/2019 -  Chemotherapy   FOLFOX q2weeks for 8 cycles starting  08/31/19      CURRENT THERAPY:  FOLFOX q2weeks starting 08/31/19. Removed 5FU bolus and increased steroids pre-meds due to thrombocytopenia and skin rash starting with C4.   INTERVAL HISTORY:  Joshua Zhang here for a follow up and treatment. He presents to the clinic alone. He notes he tolerated last cycle well. He notes he still has jaw pain only with intal bite, this only happens right after chemo and will go away. He denies dysphagia or difficulty chewing. He notes normal BM, no blood in stool. He is able to maintain weight. He notes his BP is usually high in the morning and in the clinic. He notes he takes his medication in the Afternoon. He notes only 1 small patch of rash on his left subclavicular area which is resolving.  REVIEW OF SYSTEMS:   Constitutional: Denies fevers, chills or abnormal weight loss Eyes: Denies blurriness of vision Ears, nose, mouth, throat, and face: Denies mucositis or sore throat Respiratory: Denies cough, dyspnea or wheezes Cardiovascular: Denies palpitation, chest  discomfort or lower extremity swelling Gastrointestinal:  Denies nausea, heartburn or change in bowel habits Skin: Denies abnormal skin rashes Lymphatics: Denies new lymphadenopathy or easy bruising Neurological:Denies numbness, tingling or new weaknesses Behavioral/Psych: Mood is stable, no new changes  All other systems were reviewed with the patient and are negative.  MEDICAL HISTORY:  Past Medical History:  Diagnosis Date  . Hypertension     SURGICAL HISTORY: Past Surgical History:  Procedure Laterality Date  . APPENDECTOMY     49 years old   . IR IMAGING GUIDED PORT INSERTION  08/30/2019  . SHOULDER SURGERY Bilateral 49 years old    I have reviewed the social history and family history with the patient and they are unchanged from previous note.  ALLERGIES:  has No Known Allergies.  MEDICATIONS:  Current Outpatient Medications  Medication Sig Dispense Refill  . amLODipine (NORVASC) 5 MG tablet Take by mouth.    Marland Kitchen atorvastatin (LIPITOR) 10 MG tablet Take by mouth.    Marland Kitchen BIKTARVY 50-200-25 MG TABS tablet TAKE 1 TABLET BY MOUTH ONCE DAILY 30 tablet 5  . lidocaine-prilocaine (EMLA) cream Apply to affected area once 30 g 3  . losartan (COZAAR) 25 MG tablet Take by mouth.    . Multiple Vitamin (MULTIVITAMIN) tablet Take 1 tablet by mouth daily.    . ondansetron (ZOFRAN) 8 MG tablet Take 1 tablet (8 mg total) by mouth 2 (two) times daily as needed for refractory nausea / vomiting. Start on day 3 after chemotherapy. 30 tablet 1  . prochlorperazine (COMPAZINE) 10 MG tablet Take 1 tablet (10 mg total) by mouth every 6 (six) hours as needed (Nausea or vomiting). 30 tablet 1   No current facility-administered medications for this visit.    PHYSICAL EXAMINATION: ECOG PERFORMANCE STATUS: 0 - Asymptomatic  Vitals:   11/09/19 0828  BP: (!) 150/96  Pulse: 87  Resp: 18  Temp: 98.4 F (36.9 C)  SpO2: 100%   Filed Weights   11/09/19 0828  Weight: 202 lb 11.2 oz (91.9 kg)     Due to COVID19 we will limit examination to appearance. Patient had no complaints.  GENERAL:alert, no distress and comfortable SKIN: skin color normal, no rashes or significant lesions EYES: normal, Conjunctiva are pink and non-injected, sclera clear  NEURO: alert & oriented x 3 with fluent speech   LABORATORY DATA:  I have reviewed the data as listed CBC Latest Ref Rng & Units 11/09/2019 10/26/2019 10/12/2019  WBC 4.0 - 10.5 K/uL 13.8(H) 10.6(H) 11.5(H)  Hemoglobin 13.0 - 17.0 g/dL 14.1 13.7 13.9  Hematocrit 39.0 - 52.0 % 40.1 38.6(L) 39.6  Platelets 150 - 400 K/uL 123(L) 117(L) 115(L)     CMP Latest Ref Rng & Units 10/26/2019 10/12/2019 09/28/2019  Glucose 70 - 99 mg/dL 104(H) 124(H) 109(H)  BUN 6 - 20 mg/dL '12 10 11  '$ Creatinine 0.61 - 1.24 mg/dL 0.88 0.99 1.05  Sodium 135 - 145 mmol/L 140 141 142  Potassium 3.5 - 5.1 mmol/L 3.9 4.0 3.9  Chloride 98 - 111 mmol/L 108 107 108  CO2 22 - 32 mmol/L '23 26 25  '$ Calcium 8.9 - 10.3 mg/dL 9.1 9.2 9.1  Total Protein 6.5 - 8.1 g/dL 6.7 6.7 6.9  Total Bilirubin 0.3 - 1.2 mg/dL 0.7  0.7 0.7  Alkaline Phos 38 - 126 U/L 157(H) 165(H) 132(H)  AST 15 - 41 U/L 40 39 48(H)  ALT 0 - 44 U/L 37 38 60(H)      RADIOGRAPHIC STUDIES: I have personally reviewed the radiological images as listed and agreed with the findings in the report. No results found.   ASSESSMENT & PLAN:  Joshua Zhang is a 49 y.o. male with    1.Low rectaladenocarcinoma,cT3N1M0,Stage IIIB, MMR normal -He presented with intermittent rectal bleeding for 2 years, but otherwise asymptomatic, this was discovered on screening colonoscopyin 06/2019. -Colonoscopy and scan shows he has low rectum mass which is close to sphincter. Biopsy from screening colonoscopy showed this is moderately-differentiated adenocarcinoma locally advanced. Based on MRI, this is cT3N1 stage III disease.  -Istarted him ontotal neoadjuvant chemotherapywithFOLFOXq2weeks for 8 cycles on 08/31/19.This  will be followed bychemoRT before proceeding with curative surgery.5FU bolus was removed starting with C4 due to thrombocytopenia. -S/p C5 he is stable and continues to tolerate treatment well. He still notes jaw pain with initial bite of food only right after chemo. I discussed this could be residual cold sensitivity or neuro toxicity from oxaliplatin.   -Labs reviewed, WBC 13.8, plt 123K, ANC 11.5. Overall adequate to proceed with C6 FOLFOX today.  -He will proceed with restaging CT AP on 11/21/19 to evaluate chemo response. If he is responding well will proceed with 2 more cycles before proceeding with ChemoRT.  -F/u in 2 weeks    2. Rectal bleeding, secondary to #1 -Has been intermittent and stable for the last 2 years.  -He has had loose stool once daily lately. -no anemia -Will monitor on neoadjuvant treatment.  -resolved now   3. HIV, controlled -He denies h/o or current use of recreational or IV drugs. He is unsure how he initially got infected.  -Controlled and undetectable. 09/15/18 CD4 was 350.His3/22/21 CD4 T cells is 708 -He is on Biktavy. This is managed by Dr. Megan Salon.  -I will check for any drug interaction with his chemo.    4. HLD, HTN -He will continue medications and f/u with PCP -His BP continues to be elevated in clinic but per pt normal at home. I encouraged him to continue to monitor and continue medications. He takes his medications in the afternoon.   5. Gout  -Patient feels he has gout of right anterior foot. This did occur 2 months ago and went away on its own.  -I recommend he drink plenty of water and take ibuprofen.  6. Thrombocytopenia  -Secondary to chemo FOLFOX -Removed 5FU bolus starting with C4.  -Thrombocytopenia mostly stable.   PLAN: -Labs reviewed and adequate to proceed with C6FOLFOX today, same dose -Restaging CT AP w contrast on 11/21/19 -lab, flush, f/u and chemo FOLFOX in 2,4weeks scheduled    No problem-specific  Assessment & Plan notes found for this encounter.   No orders of the defined types were placed in this encounter.  All questions were answered. The patient knows to call the clinic with any problems, questions or concerns. No barriers to learning was detected. The total time spent in the appointment was 25 minutes.     Truitt Merle, MD 11/09/2019   I, Joslyn Devon, am acting as scribe for Truitt Merle, MD.   I have reviewed the above documentation for accuracy and completeness, and I agree with the above.

## 2019-11-09 ENCOUNTER — Inpatient Hospital Stay: Payer: BC Managed Care – PPO | Admitting: Hematology

## 2019-11-09 ENCOUNTER — Encounter: Payer: Self-pay | Admitting: Hematology

## 2019-11-09 ENCOUNTER — Other Ambulatory Visit: Payer: Self-pay

## 2019-11-09 ENCOUNTER — Inpatient Hospital Stay: Payer: BC Managed Care – PPO

## 2019-11-09 ENCOUNTER — Telehealth: Payer: Self-pay | Admitting: Hematology

## 2019-11-09 VITALS — BP 150/96 | HR 87 | Temp 98.4°F | Resp 18 | Ht 70.0 in | Wt 202.7 lb

## 2019-11-09 DIAGNOSIS — D123 Benign neoplasm of transverse colon: Secondary | ICD-10-CM | POA: Diagnosis not present

## 2019-11-09 DIAGNOSIS — B20 Human immunodeficiency virus [HIV] disease: Secondary | ICD-10-CM | POA: Diagnosis not present

## 2019-11-09 DIAGNOSIS — Z79899 Other long term (current) drug therapy: Secondary | ICD-10-CM | POA: Diagnosis not present

## 2019-11-09 DIAGNOSIS — E785 Hyperlipidemia, unspecified: Secondary | ICD-10-CM | POA: Diagnosis not present

## 2019-11-09 DIAGNOSIS — K5730 Diverticulosis of large intestine without perforation or abscess without bleeding: Secondary | ICD-10-CM | POA: Diagnosis not present

## 2019-11-09 DIAGNOSIS — I10 Essential (primary) hypertension: Secondary | ICD-10-CM | POA: Diagnosis not present

## 2019-11-09 DIAGNOSIS — Z5189 Encounter for other specified aftercare: Secondary | ICD-10-CM | POA: Diagnosis not present

## 2019-11-09 DIAGNOSIS — C20 Malignant neoplasm of rectum: Secondary | ICD-10-CM

## 2019-11-09 DIAGNOSIS — D122 Benign neoplasm of ascending colon: Secondary | ICD-10-CM | POA: Diagnosis not present

## 2019-11-09 DIAGNOSIS — R21 Rash and other nonspecific skin eruption: Secondary | ICD-10-CM | POA: Diagnosis not present

## 2019-11-09 DIAGNOSIS — M109 Gout, unspecified: Secondary | ICD-10-CM | POA: Diagnosis not present

## 2019-11-09 DIAGNOSIS — I700 Atherosclerosis of aorta: Secondary | ICD-10-CM | POA: Diagnosis not present

## 2019-11-09 DIAGNOSIS — D696 Thrombocytopenia, unspecified: Secondary | ICD-10-CM | POA: Diagnosis not present

## 2019-11-09 DIAGNOSIS — Z5111 Encounter for antineoplastic chemotherapy: Secondary | ICD-10-CM | POA: Diagnosis not present

## 2019-11-09 DIAGNOSIS — Z95828 Presence of other vascular implants and grafts: Secondary | ICD-10-CM

## 2019-11-09 DIAGNOSIS — D124 Benign neoplasm of descending colon: Secondary | ICD-10-CM | POA: Diagnosis not present

## 2019-11-09 DIAGNOSIS — D125 Benign neoplasm of sigmoid colon: Secondary | ICD-10-CM | POA: Diagnosis not present

## 2019-11-09 LAB — CMP (CANCER CENTER ONLY)
ALT: 62 U/L — ABNORMAL HIGH (ref 0–44)
AST: 71 U/L — ABNORMAL HIGH (ref 15–41)
Albumin: 3.3 g/dL — ABNORMAL LOW (ref 3.5–5.0)
Alkaline Phosphatase: 207 U/L — ABNORMAL HIGH (ref 38–126)
Anion gap: 11 (ref 5–15)
BUN: 12 mg/dL (ref 6–20)
CO2: 22 mmol/L (ref 22–32)
Calcium: 8.9 mg/dL (ref 8.9–10.3)
Chloride: 108 mmol/L (ref 98–111)
Creatinine: 0.97 mg/dL (ref 0.61–1.24)
GFR, Est AFR Am: >60 mL/min (ref >60)
GFR, Estimated: >60 mL/min (ref >60)
Glucose, Bld: 148 mg/dL — ABNORMAL HIGH (ref 70–99)
Potassium: 3.9 mmol/L (ref 3.5–5.1)
Sodium: 141 mmol/L (ref 135–145)
Total Bilirubin: 0.7 mg/dL (ref 0.3–1.2)
Total Protein: 6.8 g/dL (ref 6.5–8.1)

## 2019-11-09 LAB — CBC WITH DIFFERENTIAL (CANCER CENTER ONLY)
Abs Immature Granulocytes: 0.16 10*3/uL — ABNORMAL HIGH (ref 0.00–0.07)
Basophils Absolute: 0.1 10*3/uL (ref 0.0–0.1)
Basophils Relative: 0 %
Eosinophils Absolute: 0.1 10*3/uL (ref 0.0–0.5)
Eosinophils Relative: 1 %
HCT: 40.1 % (ref 39.0–52.0)
Hemoglobin: 14.1 g/dL (ref 13.0–17.0)
Immature Granulocytes: 1 %
Lymphocytes Relative: 8 %
Lymphs Abs: 1.1 10*3/uL (ref 0.7–4.0)
MCH: 35.3 pg — ABNORMAL HIGH (ref 26.0–34.0)
MCHC: 35.2 g/dL (ref 30.0–36.0)
MCV: 100.3 fL — ABNORMAL HIGH (ref 80.0–100.0)
Monocytes Absolute: 0.9 10*3/uL (ref 0.1–1.0)
Monocytes Relative: 6 %
Neutro Abs: 11.5 10*3/uL — ABNORMAL HIGH (ref 1.7–7.7)
Neutrophils Relative %: 84 %
Platelet Count: 123 10*3/uL — ABNORMAL LOW (ref 150–400)
RBC: 4 MIL/uL — ABNORMAL LOW (ref 4.22–5.81)
RDW: 15.2 % (ref 11.5–15.5)
WBC Count: 13.8 10*3/uL — ABNORMAL HIGH (ref 4.0–10.5)
nRBC: 0 % (ref 0.0–0.2)

## 2019-11-09 MED ORDER — SODIUM CHLORIDE 0.9% FLUSH
10.0000 mL | INTRAVENOUS | Status: DC | PRN
  Administered 2019-11-09: 10 mL
  Filled 2019-11-09: qty 10

## 2019-11-09 MED ORDER — SODIUM CHLORIDE 0.9 % IV SOLN
2400.0000 mg/m2 | INTRAVENOUS | Status: DC
  Administered 2019-11-09: 5300 mg via INTRAVENOUS
  Filled 2019-11-09: qty 106

## 2019-11-09 MED ORDER — SODIUM CHLORIDE 0.9% FLUSH
10.0000 mL | INTRAVENOUS | Status: DC | PRN
  Filled 2019-11-09: qty 10

## 2019-11-09 MED ORDER — OXALIPLATIN CHEMO INJECTION 100 MG/20ML
85.0000 mg/m2 | Freq: Once | INTRAVENOUS | Status: AC
  Administered 2019-11-09: 10:00:00 185 mg via INTRAVENOUS
  Filled 2019-11-09: qty 37

## 2019-11-09 MED ORDER — PALONOSETRON HCL INJECTION 0.25 MG/5ML
0.2500 mg | Freq: Once | INTRAVENOUS | Status: AC
  Administered 2019-11-09: 09:00:00 0.25 mg via INTRAVENOUS

## 2019-11-09 MED ORDER — SODIUM CHLORIDE 0.9 % IV SOLN
15.0000 mg | Freq: Once | INTRAVENOUS | Status: AC
  Administered 2019-11-09: 15 mg via INTRAVENOUS
  Filled 2019-11-09: qty 1.5

## 2019-11-09 MED ORDER — DEXTROSE 5 % IV SOLN
Freq: Once | INTRAVENOUS | Status: AC
  Filled 2019-11-09: qty 250

## 2019-11-09 MED ORDER — PALONOSETRON HCL INJECTION 0.25 MG/5ML
INTRAVENOUS | Status: AC
  Filled 2019-11-09: qty 5

## 2019-11-09 MED ORDER — LEUCOVORIN CALCIUM INJECTION 350 MG
400.0000 mg/m2 | Freq: Once | INTRAVENOUS | Status: AC
  Administered 2019-11-09: 10:00:00 880 mg via INTRAVENOUS
  Filled 2019-11-09: qty 44

## 2019-11-09 NOTE — Telephone Encounter (Signed)
Made no changes to pt's schedule per 4/22 los.

## 2019-11-09 NOTE — Progress Notes (Signed)
Okay to initiate treatment pending CMP results since lab machine is down per Dr. Burr Medico. Will continue to monitor for CMP results.

## 2019-11-09 NOTE — Patient Instructions (Signed)
[facility] Discharge Instructions for Patients Receiving Chemotherapy  Today you received the following chemotherapy agents Oxaliplatin, Leucovorin, 5FU  To help prevent nausea and vomiting after your treatment, we encourage you to take your nausea medication as directed   If you develop nausea and vomiting that is not controlled by your nausea medication, call the clinic.   BELOW ARE SYMPTOMS THAT SHOULD BE REPORTED IMMEDIATELY:  *FEVER GREATER THAN 100.5 F  *CHILLS WITH OR WITHOUT FEVER  NAUSEA AND VOMITING THAT IS NOT CONTROLLED WITH YOUR NAUSEA MEDICATION  *UNUSUAL SHORTNESS OF BREATH  *UNUSUAL BRUISING OR BLEEDING  TENDERNESS IN MOUTH AND THROAT WITH OR WITHOUT PRESENCE OF ULCERS  *URINARY PROBLEMS  *BOWEL PROBLEMS  UNUSUAL RASH Items with * indicate a potential emergency and should be followed up as soon as possible.  Feel free to call the clinic should you have any questions or concerns. The clinic phone number is [phone].  Please show the CHEMO ALERT CARD at check-in to the Emergency Department and triage nurse.   

## 2019-11-11 ENCOUNTER — Other Ambulatory Visit: Payer: Self-pay

## 2019-11-11 ENCOUNTER — Inpatient Hospital Stay: Payer: BC Managed Care – PPO

## 2019-11-11 VITALS — BP 130/81 | HR 66 | Temp 98.9°F | Resp 18

## 2019-11-11 DIAGNOSIS — R21 Rash and other nonspecific skin eruption: Secondary | ICD-10-CM | POA: Diagnosis not present

## 2019-11-11 DIAGNOSIS — M109 Gout, unspecified: Secondary | ICD-10-CM | POA: Diagnosis not present

## 2019-11-11 DIAGNOSIS — I700 Atherosclerosis of aorta: Secondary | ICD-10-CM | POA: Diagnosis not present

## 2019-11-11 DIAGNOSIS — D122 Benign neoplasm of ascending colon: Secondary | ICD-10-CM | POA: Diagnosis not present

## 2019-11-11 DIAGNOSIS — I10 Essential (primary) hypertension: Secondary | ICD-10-CM | POA: Diagnosis not present

## 2019-11-11 DIAGNOSIS — Z5111 Encounter for antineoplastic chemotherapy: Secondary | ICD-10-CM | POA: Diagnosis not present

## 2019-11-11 DIAGNOSIS — D124 Benign neoplasm of descending colon: Secondary | ICD-10-CM | POA: Diagnosis not present

## 2019-11-11 DIAGNOSIS — D696 Thrombocytopenia, unspecified: Secondary | ICD-10-CM | POA: Diagnosis not present

## 2019-11-11 DIAGNOSIS — C20 Malignant neoplasm of rectum: Secondary | ICD-10-CM | POA: Diagnosis not present

## 2019-11-11 DIAGNOSIS — Z5189 Encounter for other specified aftercare: Secondary | ICD-10-CM | POA: Diagnosis not present

## 2019-11-11 DIAGNOSIS — Z79899 Other long term (current) drug therapy: Secondary | ICD-10-CM | POA: Diagnosis not present

## 2019-11-11 DIAGNOSIS — E785 Hyperlipidemia, unspecified: Secondary | ICD-10-CM | POA: Diagnosis not present

## 2019-11-11 DIAGNOSIS — D125 Benign neoplasm of sigmoid colon: Secondary | ICD-10-CM | POA: Diagnosis not present

## 2019-11-11 DIAGNOSIS — D123 Benign neoplasm of transverse colon: Secondary | ICD-10-CM | POA: Diagnosis not present

## 2019-11-11 DIAGNOSIS — K5730 Diverticulosis of large intestine without perforation or abscess without bleeding: Secondary | ICD-10-CM | POA: Diagnosis not present

## 2019-11-11 MED ORDER — SODIUM CHLORIDE 0.9% FLUSH
10.0000 mL | INTRAVENOUS | Status: DC | PRN
  Administered 2019-11-11: 10 mL
  Filled 2019-11-11: qty 10

## 2019-11-11 MED ORDER — PEGFILGRASTIM-CBQV 6 MG/0.6ML ~~LOC~~ SOSY
6.0000 mg | PREFILLED_SYRINGE | Freq: Once | SUBCUTANEOUS | Status: AC
  Administered 2019-11-11: 6 mg via SUBCUTANEOUS

## 2019-11-11 MED ORDER — HEPARIN SOD (PORK) LOCK FLUSH 100 UNIT/ML IV SOLN
500.0000 [IU] | Freq: Once | INTRAVENOUS | Status: AC | PRN
  Administered 2019-11-11: 500 [IU]
  Filled 2019-11-11: qty 5

## 2019-11-17 NOTE — Progress Notes (Signed)
Pharmacist Chemotherapy Monitoring - Follow Up Assessment    I verify that I have reviewed each item in the below checklist:  . Regimen for the patient is scheduled for the appropriate day and plan matches scheduled date. Marland Kitchen Appropriate non-routine labs are ordered dependent on drug ordered. . If applicable, additional medications reviewed and ordered per protocol based on lifetime cumulative doses and/or treatment regimen.   Plan for follow-up and/or issues identified: No . I-vent associated with next due treatment: No . MD and/or nursing notified: No   Kennith Center, Pharm.D., CPP 11/17/2019@4 :37 PM

## 2019-11-21 ENCOUNTER — Ambulatory Visit (HOSPITAL_COMMUNITY)
Admission: RE | Admit: 2019-11-21 | Discharge: 2019-11-21 | Disposition: A | Discharge status: Home / Self Care | Payer: BC Managed Care – PPO | Source: Ambulatory Visit | Attending: Hematology | Admitting: Hematology

## 2019-11-21 ENCOUNTER — Other Ambulatory Visit: Payer: Self-pay

## 2019-11-21 ENCOUNTER — Encounter (HOSPITAL_COMMUNITY): Payer: Self-pay

## 2019-11-21 DIAGNOSIS — C20 Malignant neoplasm of rectum: Secondary | ICD-10-CM

## 2019-11-21 DIAGNOSIS — C19 Malignant neoplasm of rectosigmoid junction: Secondary | ICD-10-CM | POA: Diagnosis not present

## 2019-11-21 HISTORY — DX: Malignant (primary) neoplasm, unspecified: C80.1

## 2019-11-21 MED ORDER — IOHEXOL 300 MG/ML  SOLN
100.0000 mL | Freq: Once | INTRAMUSCULAR | Status: AC | PRN
  Administered 2019-11-21: 100 mL via INTRAVENOUS

## 2019-11-21 MED ORDER — SODIUM CHLORIDE (PF) 0.9 % IJ SOLN
INTRAMUSCULAR | Status: AC
  Filled 2019-11-21: qty 50

## 2019-11-22 NOTE — Progress Notes (Signed)
[place]   [phone] [phone]) [phone]   Clinic Follow up Note   Patient Care Team: Merwyn Katos as PCP - General (Physician Assistant) Michel Bickers, MD as Consulting Physician (Infectious Diseases) Leighton Ruff, MD as Consulting Physician (General Surgery) Truitt Merle, MD as Consulting Physician (Hematology) Kyung Rudd, MD as Consulting Physician (Radiation Oncology) Jonnie Finner, RN as Oncology Nurse Navigator  Date of Service:  11/23/2019  CHIEF COMPLAINT: F/u of rectal cancer  SUMMARY OF ONCOLOGIC HISTORY: Oncology History Overview Note  Cancer Staging Rectal adenocarcinoma [facility]) Staging form: Colon and Rectum, AJCC 8th Edition - Clinical stage from 08/21/2019: Stage IIIB (cT3, cN1, cM0) - Signed by Truitt Merle, MD on 08/21/2019    Rectal adenocarcinoma ([place])  07/03/2019 Procedure   Colonoscopy by Dr. Collene Mares  IMPRESSION -Likely Malignant 5cm, circumferential, bleeding tumor in the rectum, 304 cm from the anal verge -biopsy done  -One 8 mm sessile polyp in the distal sigmoid colon, removed with a hot snare x2; resected and retrieved.  -Two small sessile polyps, 1 in the mid-descending colon and 1 in the mid transverse colon - removed by cold biopsies.  -One 47m sessile polyp in the mid transverse colon, removed with a hot snare x1; resected and retrieved.  -One 843msessile polyp in the proximal ascending colon, removed with a hot snare X1; resected and retrieved.  -few large scattered diverticula.   07/03/2019 Initial Biopsy   FINAL DIAGNOSIS 07/03/19  A. Colon, Descending, Polyp, Polypectomy:   -TUBULAR ADENOMA   -No high grade dysplasia or malignancy.  B. Colon, transverse, polyp, polypectomy:   -TUBULAR ADENOMA  -No high grade dysplasia or malignancy. C. Colon, Ascending, Polyp, Polypectomy:   -Fragments of sessile serrated adenoma/polyp D. Colon, sigmoid, polyp, polypectomy:   -Fragments of Tubular Adenoma  -No  high grade dysplasia or malignancy. E. Rectum, Mass, Biopsy:   -INVASIVE COLONIC ADENOCARCINOMA, MODERATELY-DIFFERENTIATED, see comment     07/12/2019 Imaging   CT AP W Contrast 07/12/19  IMPRESSION: 1. No evidence of metastatic disease within the chest, abdomen or pelvis. 2. Possible mild wall thickening in the sigmoid colon, although no focal colonic mass is identified. There are few distal colonic diverticula. 3. Sequela of prior granulomatous disease. 4. Possible cholelithiasis with mild wall thickening of the gallbladder fundus. 5. Aortic Atherosclerosis (ICD10-I70.0).   08/21/2019 Initial Diagnosis   [place]  08/21/2019 Cancer Staging   Staging form: Colon and Rectum, AJCC 8th Edition - Clinical stage from 08/21/2019: Stage IIIB (cT3, cN1, cM0) - Signed by FeTruitt MerleMD on 08/21/2019   08/31/2019 -  Chemotherapy   FOLFOX q2weeks for 8 cycles starting  08/31/19   11/21/2019 Imaging   CT AP W contrast  IMPRESSION: 1. Stable exam. No evidence for metastatic disease within the abdomen or pelvis. 2. Splenomegaly.  New from previous exam. 3. Gallstones.   Aortic Atherosclerosis (ICD10-I70.0).      CURRENT THERAPY:  FOLFOX q2weeks starting 08/31/19. Removed 5FU bolus and increased steroids pre-meds due to thrombocytopenia and skin rash starting with C4.   INTERVAL HISTORY:  Joshua Zhang here for a follow up and treatment. He presents to the clinic alone. He notes he is doing well. He notes he only has 2-3 days of numbness after each cycle, stable. He notes he is eating and drinking adequately, weight stable. He notes he received both his COVID19 vaccines. He denies left abdominal pain with recent splenomegaly.   REVIEW OF SYSTEMS:  Constitutional: Denies fevers, chills or abnormal weight loss Eyes: Denies blurriness of vision Ears, nose, mouth, throat, and face: Denies mucositis or sore throat Respiratory: Denies cough, dyspnea or  wheezes Cardiovascular: Denies palpitation, chest discomfort or lower extremity swelling Gastrointestinal:  Denies nausea, heartburn or change in bowel habits Skin: Denies abnormal skin rashes Lymphatics: Denies new lymphadenopathy or easy bruising Neurological:Denies numbness, tingling or new weaknesses Behavioral/Psych: Mood is stable, no new changes  All other systems were reviewed with the patient and are negative.  MEDICAL HISTORY:  Past Medical History:  Diagnosis Date  . Hypertension   . rectal ca dx'd 06/2019    SURGICAL HISTORY: Past Surgical History:  Procedure Laterality Date  . APPENDECTOMY     49 years old   . IR IMAGING GUIDED PORT INSERTION  08/30/2019  . SHOULDER SURGERY Bilateral 49 years old    I have reviewed the social history and family history with the patient and they are unchanged from previous note.  ALLERGIES:  has No Known Allergies.  MEDICATIONS:  Current Outpatient Medications  Medication Sig Dispense Refill  . amLODipine (NORVASC) 5 MG tablet Take by mouth.    Marland Kitchen atorvastatin (LIPITOR) 10 MG tablet Take by mouth.    Marland Kitchen BIKTARVY 50-200-25 MG TABS tablet TAKE 1 TABLET BY MOUTH ONCE DAILY 30 tablet 5  . lidocaine-prilocaine (EMLA) cream Apply to affected area once 30 g 3  . losartan (COZAAR) 25 MG tablet Take by mouth.    . Multiple Vitamin (MULTIVITAMIN) tablet Take 1 tablet by mouth daily.    . ondansetron (ZOFRAN) 8 MG tablet Take 1 tablet (8 mg total) by mouth 2 (two) times daily as needed for refractory nausea / vomiting. Start on day 3 after chemotherapy. 30 tablet 1  . prochlorperazine (COMPAZINE) 10 MG tablet Take 1 tablet (10 mg total) by mouth every 6 (six) hours as needed (Nausea or vomiting). 30 tablet 1   No current facility-administered medications for this visit.    PHYSICAL EXAMINATION: ECOG PERFORMANCE STATUS: 0 - Asymptomatic  Vitals:   11/23/19 0923  BP: 132/88  Pulse: 79  Resp: 18  Temp: 98 F (36.7 C)  SpO2: 99%    Filed Weights   11/23/19 0923  Weight: 204 lb 3.2 oz (92.6 kg)    Due to COVID19 we will limit examination to appearance. Patient had no complaints.  GENERAL:alert, no distress and comfortable SKIN: skin color normal, no rashes or significant lesions EYES: normal, Conjunctiva are pink and non-injected, sclera clear  NEURO: alert & oriented x 3 with fluent speech   LABORATORY DATA:  I have reviewed the data as listed CBC Latest Ref Rng & Units 11/23/2019 11/09/2019 10/26/2019  WBC 4.0 - 10.5 K/uL 7.4 13.8(H) 10.6(H)  Hemoglobin 13.0 - 17.0 g/dL 13.5 14.1 13.7  Hematocrit 39.0 - 52.0 % 38.7(L) 40.1 38.6(L)  Platelets 150 - 400 K/uL 105(L) 123(L) 117(L)     CMP Latest Ref Rng & Units 11/09/2019 10/26/2019 10/12/2019  Glucose 70 - 99 mg/dL 148(H) 104(H) 124(H)  BUN 6 - 20 mg/dL _0 Creatinine 0.61 - 1.24 mg/dL 0.97 0.88 0.99  Sodium 135 - 145 mmol/L 141 140 141  Potassium 3.5 - 5.1 mmol/L 3.9 3.9 4.0  Chloride 98 - 111 mmol/L 108 108 107  CO2 22 - 32 mmol/L _1 Calcium 8.9 - 10.3 mg/dL 8.9 9.1 9.2  Total Protein 6.5 - 8.1 g/dL 6.8 6.7 6.7  Total Bilirubin 0.3 - 1.2 mg/dL 0.7  0.7 0.7  Alkaline Phos 38 - 126 U/L 207(H) 157(H) 165(H)  AST 15 - 41 U/L 71(H) 40 39  ALT 0 - 44 U/L 62(H) 37 38      RADIOGRAPHIC STUDIES: I have personally reviewed the radiological images as listed and agreed with the findings in the report. No results found.   ASSESSMENT & PLAN:  Joshua Zhang is a 49 y.o. male with    1.Low rectaladenocarcinoma,cT3N1M0,Stage IIIB, MMR normal -He presented with intermittent rectal bleeding for 2 years, but otherwise asymptomatic, this was discovered on screening colonoscopyin 06/2019. -Colonoscopy and scan shows he has low rectum mass which is close to sphincter. Biopsy from screening colonoscopy showed this is moderately-differentiated adenocarcinoma locally advanced. Based on MRI, this is cT3N1 stage III disease.  -Istarted him ontotal  neoadjuvant chemotherapywithFOLFOXq2weeks for 8 cycles on 08/31/19.This will be followed bychemoRT with Xeloda before proceeding with curative surgery.5FU bolus was removed starting with C4 due to thrombocytopenia. -I personally reviewed and discussed his CT AP from 11/21/19 which shows stable exam with no evidence for metastatic disease. Scan also shows splenomegaly and gallstones. Etiology of splenomegaly is unknown, will monitor on future scan. I discussed avoiding injury to spleen as it is at risk of rupture.  -Will continue current treatment to complete 8 cycles before proceeding with Concurrent ChemoRT with Xeloda.  -S/p C6 he continues to tolerate well with 2-3 days of numbness and able to maintain weight. Labs reviewed, plt 105, CMP pending, will proceed with C7 FOLFOX today.  -F/u in 2 weeks    2. Rectal bleeding, secondary to #1 -Has been intermittent and stable for the last 2 years.  -He has had loose stool once daily lately. -no anemia -Will monitor on neoadjuvant treatment.  -resolved now    3. HIV, controlled -He denies h/o or current use of recreational or IV drugs. He is unsure how he initially got infected.  -Controlled and undetectable. 09/15/18 CD4 was 350.His3/22/21 CD4 T cells is 708 -He is on Biktavy. This is managed by Dr. Megan Salon.   -He has received both his COVID19 vaccines. I discussed given his HIV and being on chemo his immune response may not be as effective. I discussed if a booster vaccine becomes available he would be a good candidate.    4. HLD, HTN -He will continue medications and f/u with PCP -His BP continues to be elevated in clinic but per pt normal at home. I encouraged him to continue to monitor and continue medications.He takes his medications in the afternoon.   5. Gout  -Patient feels he has gout of right anterior foot. This did occur 2 months ago and went away on its own.  -I recommend he drink plenty of water and take  ibuprofen.  6. Thrombocytopenia  -Secondary to chemo FOLFOX -Removed 5FU bolus starting with C4.  -Thrombocytopenia mostly stable.    PLAN: -CT AP reviewed, stable disease -Labs reviewed plt 105, CMP pending and will proceed with C7FOLFOX today if CMP is adequate, -lab, flush, f/u and chemo FOLFOX in 2 weeks   -I sent a message to RAD/ON[facility] about his CT simulation schedule     No problem-specific Assessment & Plan notes found for this encounter.   No orders of the defined types were placed in this encounter.  All questions were answered. The patient knows to call the clinic with any problems, questions or concerns. No barriers to learning was detected. The total time spent in the appointment was 30 minutes.  Truitt Merle, MD 11/23/2019   I, Joslyn Devon, am acting as scribe for Truitt Merle, MD.   I have reviewed the above documentation for accuracy and completeness, and I agree with the above.

## 2019-11-23 ENCOUNTER — Inpatient Hospital Stay: Payer: BC Managed Care – PPO

## 2019-11-23 ENCOUNTER — Other Ambulatory Visit: Payer: Self-pay

## 2019-11-23 ENCOUNTER — Encounter: Payer: Self-pay | Admitting: Hematology

## 2019-11-23 ENCOUNTER — Inpatient Hospital Stay: Payer: BC Managed Care – PPO | Admitting: Hematology

## 2019-11-23 ENCOUNTER — Inpatient Hospital Stay: Payer: BC Managed Care – PPO | Attending: Hematology

## 2019-11-23 VITALS — BP 132/88 | HR 79 | Temp 98.0°F | Resp 18 | Ht 70.0 in | Wt 204.2 lb

## 2019-11-23 DIAGNOSIS — K8020 Calculus of gallbladder without cholecystitis without obstruction: Secondary | ICD-10-CM | POA: Insufficient documentation

## 2019-11-23 DIAGNOSIS — R21 Rash and other nonspecific skin eruption: Secondary | ICD-10-CM | POA: Insufficient documentation

## 2019-11-23 DIAGNOSIS — C20 Malignant neoplasm of rectum: Secondary | ICD-10-CM | POA: Insufficient documentation

## 2019-11-23 DIAGNOSIS — Z79899 Other long term (current) drug therapy: Secondary | ICD-10-CM | POA: Diagnosis not present

## 2019-11-23 DIAGNOSIS — R161 Splenomegaly, not elsewhere classified: Secondary | ICD-10-CM | POA: Insufficient documentation

## 2019-11-23 DIAGNOSIS — R202 Paresthesia of skin: Secondary | ICD-10-CM | POA: Diagnosis not present

## 2019-11-23 DIAGNOSIS — I10 Essential (primary) hypertension: Secondary | ICD-10-CM | POA: Diagnosis not present

## 2019-11-23 DIAGNOSIS — D123 Benign neoplasm of transverse colon: Secondary | ICD-10-CM | POA: Insufficient documentation

## 2019-11-23 DIAGNOSIS — Z21 Asymptomatic human immunodeficiency virus [HIV] infection status: Secondary | ICD-10-CM | POA: Diagnosis not present

## 2019-11-23 DIAGNOSIS — D124 Benign neoplasm of descending colon: Secondary | ICD-10-CM | POA: Diagnosis not present

## 2019-11-23 DIAGNOSIS — R200 Anesthesia of skin: Secondary | ICD-10-CM | POA: Diagnosis not present

## 2019-11-23 DIAGNOSIS — D125 Benign neoplasm of sigmoid colon: Secondary | ICD-10-CM | POA: Diagnosis not present

## 2019-11-23 DIAGNOSIS — E785 Hyperlipidemia, unspecified: Secondary | ICD-10-CM | POA: Insufficient documentation

## 2019-11-23 DIAGNOSIS — M109 Gout, unspecified: Secondary | ICD-10-CM | POA: Diagnosis not present

## 2019-11-23 DIAGNOSIS — Z95828 Presence of other vascular implants and grafts: Secondary | ICD-10-CM

## 2019-11-23 DIAGNOSIS — Z5189 Encounter for other specified aftercare: Secondary | ICD-10-CM | POA: Diagnosis not present

## 2019-11-23 DIAGNOSIS — Z5111 Encounter for antineoplastic chemotherapy: Secondary | ICD-10-CM | POA: Diagnosis not present

## 2019-11-23 DIAGNOSIS — D696 Thrombocytopenia, unspecified: Secondary | ICD-10-CM | POA: Diagnosis not present

## 2019-11-23 DIAGNOSIS — D122 Benign neoplasm of ascending colon: Secondary | ICD-10-CM | POA: Insufficient documentation

## 2019-11-23 DIAGNOSIS — I700 Atherosclerosis of aorta: Secondary | ICD-10-CM | POA: Diagnosis not present

## 2019-11-23 LAB — CBC WITH DIFFERENTIAL (CANCER CENTER ONLY)
Abs Immature Granulocytes: 0.12 10*3/uL — ABNORMAL HIGH (ref 0.00–0.07)
Basophils Absolute: 0 10*3/uL (ref 0.0–0.1)
Basophils Relative: 1 %
Eosinophils Absolute: 0.1 10*3/uL (ref 0.0–0.5)
Eosinophils Relative: 2 %
HCT: 38.7 % — ABNORMAL LOW (ref 39.0–52.0)
Hemoglobin: 13.5 g/dL (ref 13.0–17.0)
Immature Granulocytes: 2 %
Lymphocytes Relative: 15 %
Lymphs Abs: 1.1 10*3/uL (ref 0.7–4.0)
MCH: 35.3 pg — ABNORMAL HIGH (ref 26.0–34.0)
MCHC: 34.9 g/dL (ref 30.0–36.0)
MCV: 101.3 fL — ABNORMAL HIGH (ref 80.0–100.0)
Monocytes Absolute: 0.6 10*3/uL (ref 0.1–1.0)
Monocytes Relative: 8 %
Neutro Abs: 5.4 10*3/uL (ref 1.7–7.7)
Neutrophils Relative %: 72 %
Platelet Count: 105 10*3/uL — ABNORMAL LOW (ref 150–400)
RBC: 3.82 MIL/uL — ABNORMAL LOW (ref 4.22–5.81)
RDW: 14.9 % (ref 11.5–15.5)
WBC Count: 7.4 10*3/uL (ref 4.0–10.5)
nRBC: 0 % (ref 0.0–0.2)

## 2019-11-23 LAB — CMP (CANCER CENTER ONLY)
ALT: 49 U/L — ABNORMAL HIGH (ref 0–44)
AST: 59 U/L — ABNORMAL HIGH (ref 15–41)
Albumin: 3.3 g/dL — ABNORMAL LOW (ref 3.5–5.0)
Alkaline Phosphatase: 203 U/L — ABNORMAL HIGH (ref 38–126)
Anion gap: 12 (ref 5–15)
BUN: 9 mg/dL (ref 6–20)
CO2: 22 mmol/L (ref 22–32)
Calcium: 8.8 mg/dL — ABNORMAL LOW (ref 8.9–10.3)
Chloride: 109 mmol/L (ref 98–111)
Creatinine: 0.87 mg/dL (ref 0.61–1.24)
GFR, Est AFR Am: >60 mL/min (ref >60)
GFR, Estimated: >60 mL/min (ref >60)
Glucose, Bld: 135 mg/dL — ABNORMAL HIGH (ref 70–99)
Potassium: 3.9 mmol/L (ref 3.5–5.1)
Sodium: 143 mmol/L (ref 135–145)
Total Bilirubin: 0.7 mg/dL (ref 0.3–1.2)
Total Protein: 6.7 g/dL (ref 6.5–8.1)

## 2019-11-23 MED ORDER — PALONOSETRON HCL INJECTION 0.25 MG/5ML
INTRAVENOUS | Status: AC
  Filled 2019-11-23: qty 5

## 2019-11-23 MED ORDER — SODIUM CHLORIDE 0.9 % IV SOLN
15.0000 mg | Freq: Once | INTRAVENOUS | Status: AC
  Administered 2019-11-23: 15 mg via INTRAVENOUS
  Filled 2019-11-23: qty 1.5

## 2019-11-23 MED ORDER — PALONOSETRON HCL INJECTION 0.25 MG/5ML
0.2500 mg | Freq: Once | INTRAVENOUS | Status: AC
  Administered 2019-11-23: 0.25 mg via INTRAVENOUS

## 2019-11-23 MED ORDER — LEUCOVORIN CALCIUM INJECTION 350 MG
400.0000 mg/m2 | Freq: Once | INTRAVENOUS | Status: AC
  Administered 2019-11-23: 880 mg via INTRAVENOUS
  Filled 2019-11-23: qty 44

## 2019-11-23 MED ORDER — SODIUM CHLORIDE 0.9% FLUSH
10.0000 mL | INTRAVENOUS | Status: DC | PRN
  Filled 2019-11-23: qty 10

## 2019-11-23 MED ORDER — SODIUM CHLORIDE 0.9% FLUSH
10.0000 mL | INTRAVENOUS | Status: DC | PRN
  Administered 2019-11-23: 10 mL
  Filled 2019-11-23: qty 10

## 2019-11-23 MED ORDER — OXALIPLATIN CHEMO INJECTION 100 MG/20ML
85.0000 mg/m2 | Freq: Once | INTRAVENOUS | Status: AC
  Administered 2019-11-23: 185 mg via INTRAVENOUS
  Filled 2019-11-23: qty 37

## 2019-11-23 MED ORDER — DEXTROSE 5 % IV SOLN
Freq: Once | INTRAVENOUS | Status: AC
  Filled 2019-11-23: qty 250

## 2019-11-23 MED ORDER — SODIUM CHLORIDE 0.9 % IV SOLN
2400.0000 mg/m2 | INTRAVENOUS | Status: DC
  Administered 2019-11-23: 5300 mg via INTRAVENOUS
  Filled 2019-11-23: qty 106

## 2019-11-23 MED FILL — BIKTARVY 50-200-25 MG TABS: 50-200-25 | 30 days supply | Qty: 30 | Fill #5

## 2019-11-23 NOTE — Patient Instructions (Signed)
Cancer Center Discharge Instructions for Patients Receiving Chemotherapy  Today you received the following chemotherapy agents Oxaliplatin, Leucovorin, 5FU  To help prevent nausea and vomiting after your treatment, we encourage you to take your nausea medication as directed   If you develop nausea and vomiting that is not controlled by your nausea medication, call the clinic.   BELOW ARE SYMPTOMS THAT SHOULD BE REPORTED IMMEDIATELY:  *FEVER GREATER THAN 100.5 F  *CHILLS WITH OR WITHOUT FEVER  NAUSEA AND VOMITING THAT IS NOT CONTROLLED WITH YOUR NAUSEA MEDICATION  *UNUSUAL SHORTNESS OF BREATH  *UNUSUAL BRUISING OR BLEEDING  TENDERNESS IN MOUTH AND THROAT WITH OR WITHOUT PRESENCE OF ULCERS  *URINARY PROBLEMS  *BOWEL PROBLEMS  UNUSUAL RASH Items with * indicate a potential emergency and should be followed up as soon as possible.  Feel free to call the clinic should you have any questions or concerns. The clinic phone number is [phone].  Please show the CHEMO ALERT CARD at check-in to the Emergency Department and triage nurse.   

## 2019-11-25 ENCOUNTER — Inpatient Hospital Stay: Payer: BC Managed Care – PPO

## 2019-11-25 ENCOUNTER — Other Ambulatory Visit: Payer: Self-pay

## 2019-11-25 VITALS — BP 131/87 | HR 69 | Temp 99.1°F | Resp 17

## 2019-11-25 DIAGNOSIS — R202 Paresthesia of skin: Secondary | ICD-10-CM | POA: Diagnosis not present

## 2019-11-25 DIAGNOSIS — R21 Rash and other nonspecific skin eruption: Secondary | ICD-10-CM | POA: Diagnosis not present

## 2019-11-25 DIAGNOSIS — D122 Benign neoplasm of ascending colon: Secondary | ICD-10-CM | POA: Diagnosis not present

## 2019-11-25 DIAGNOSIS — C20 Malignant neoplasm of rectum: Secondary | ICD-10-CM | POA: Diagnosis not present

## 2019-11-25 DIAGNOSIS — D125 Benign neoplasm of sigmoid colon: Secondary | ICD-10-CM | POA: Diagnosis not present

## 2019-11-25 DIAGNOSIS — I10 Essential (primary) hypertension: Secondary | ICD-10-CM | POA: Diagnosis not present

## 2019-11-25 DIAGNOSIS — D123 Benign neoplasm of transverse colon: Secondary | ICD-10-CM | POA: Diagnosis not present

## 2019-11-25 DIAGNOSIS — D124 Benign neoplasm of descending colon: Secondary | ICD-10-CM | POA: Diagnosis not present

## 2019-11-25 DIAGNOSIS — K8020 Calculus of gallbladder without cholecystitis without obstruction: Secondary | ICD-10-CM | POA: Diagnosis not present

## 2019-11-25 DIAGNOSIS — Z5189 Encounter for other specified aftercare: Secondary | ICD-10-CM | POA: Diagnosis not present

## 2019-11-25 DIAGNOSIS — I700 Atherosclerosis of aorta: Secondary | ICD-10-CM | POA: Diagnosis not present

## 2019-11-25 DIAGNOSIS — M109 Gout, unspecified: Secondary | ICD-10-CM | POA: Diagnosis not present

## 2019-11-25 DIAGNOSIS — Z5111 Encounter for antineoplastic chemotherapy: Secondary | ICD-10-CM | POA: Diagnosis not present

## 2019-11-25 DIAGNOSIS — R161 Splenomegaly, not elsewhere classified: Secondary | ICD-10-CM | POA: Diagnosis not present

## 2019-11-25 DIAGNOSIS — D696 Thrombocytopenia, unspecified: Secondary | ICD-10-CM | POA: Diagnosis not present

## 2019-11-25 DIAGNOSIS — R200 Anesthesia of skin: Secondary | ICD-10-CM | POA: Diagnosis not present

## 2019-11-25 MED ORDER — PEGFILGRASTIM-CBQV 6 MG/0.6ML ~~LOC~~ SOSY
6.0000 mg | PREFILLED_SYRINGE | Freq: Once | SUBCUTANEOUS | Status: AC
  Administered 2019-11-25: 6 mg via SUBCUTANEOUS

## 2019-11-25 MED ORDER — SODIUM CHLORIDE 0.9% FLUSH
10.0000 mL | INTRAVENOUS | Status: DC | PRN
  Administered 2019-11-25: 10 mL
  Filled 2019-11-25: qty 10

## 2019-11-25 MED ORDER — HEPARIN SOD (PORK) LOCK FLUSH 100 UNIT/ML IV SOLN
500.0000 [IU] | Freq: Once | INTRAVENOUS | Status: AC | PRN
  Administered 2019-11-25: 500 [IU]
  Filled 2019-11-25: qty 5

## 2019-11-28 DIAGNOSIS — C20 Malignant neoplasm of rectum: Secondary | ICD-10-CM | POA: Diagnosis not present

## 2019-12-01 NOTE — Progress Notes (Signed)
Pharmacist Chemotherapy Monitoring - Follow Up Assessment    I verify that I have reviewed each item in the below checklist:  . Regimen for the patient is scheduled for the appropriate day and plan matches scheduled date. Marland Kitchen Appropriate non-routine labs are ordered dependent on drug ordered. . If applicable, additional medications reviewed and ordered per protocol based on lifetime cumulative doses and/or treatment regimen.   Plan for follow-up and/or issues identified: No . I-vent associated with next due treatment: No . MD and/or nursing notified: No  Joshua Zhang K 12/01/2019 9:54 AM

## 2019-12-06 ENCOUNTER — Encounter: Payer: Self-pay | Admitting: Radiation Oncology

## 2019-12-06 NOTE — Progress Notes (Signed)
[place]   Telephone:(336) [phone] Fax:(336) [phone]   Clinic Follow up Note   Patient Care Team: Merwyn Katos as PCP - General (Physician Assistant) Michel Bickers, MD as Consulting Physician (Infectious Diseases) Leighton Ruff, MD as Consulting Physician (General Surgery) Truitt Merle, MD as Consulting Physician (Hematology) Kyung Rudd, MD as Consulting Physician (Radiation Oncology) Jonnie Finner, RN as Oncology Nurse Navigator  Date of Service:  12/07/2019  CHIEF COMPLAINT: F/u of rectal cancer  SUMMARY OF ONCOLOGIC HISTORY: Oncology History Overview Note  Cancer Staging Rectal adenocarcinoma [facility]) Staging form: Colon and Rectum, AJCC 8th Edition - Clinical stage from 08/21/2019: Stage IIIB (cT3, cN1, cM0) - Signed by Truitt Merle, MD on 08/21/2019    Rectal adenocarcinoma ([place])  07/03/2019 Procedure   Colonoscopy by Dr. Collene Mares  IMPRESSION -Likely Malignant 5cm, circumferential, bleeding tumor in the rectum, 304 cm from the anal verge -biopsy done  -One 8 mm sessile polyp in the distal sigmoid colon, removed with a hot snare x2; resected and retrieved.  -Two small sessile polyps, 1 in the mid-descending colon and 1 in the mid transverse colon - removed by cold biopsies.  -One 67m sessile polyp in the mid transverse colon, removed with a hot snare x1; resected and retrieved.  -One 864msessile polyp in the proximal ascending colon, removed with a hot snare X1; resected and retrieved.  -few large scattered diverticula.   07/03/2019 Initial Biopsy   FINAL DIAGNOSIS 07/03/19  A. Colon, Descending, Polyp, Polypectomy:   -TUBULAR ADENOMA   -No high grade dysplasia or malignancy.  B. Colon, transverse, polyp, polypectomy:   -TUBULAR ADENOMA  -No high grade dysplasia or malignancy. C. Colon, Ascending, Polyp, Polypectomy:   -Fragments of sessile serrated adenoma/polyp D. Colon, sigmoid, polyp, polypectomy:   -Fragments of Tubular Adenoma  -No  high grade dysplasia or malignancy. E. Rectum, Mass, Biopsy:   -INVASIVE COLONIC ADENOCARCINOMA, MODERATELY-DIFFERENTIATED, see comment     07/12/2019 Imaging   CT AP W Contrast 07/12/19  IMPRESSION: 1. No evidence of metastatic disease within the chest, abdomen or pelvis. 2. Possible mild wall thickening in the sigmoid colon, although no focal colonic mass is identified. There are few distal colonic diverticula. 3. Sequela of prior granulomatous disease. 4. Possible cholelithiasis with mild wall thickening of the gallbladder fundus. 5. Aortic Atherosclerosis (ICD10-I70.0).   08/21/2019 Initial Diagnosis   [place]al [place]  08/21/2019 Cancer Staging   Staging form: Colon and Rectum, AJCC 8th Edition - Clinical stage from 08/21/2019: Stage IIIB (cT3, cN1, cM0) - Signed by FeTruitt MerleMD on 08/21/2019   08/31/2019 - 12/07/2019 Chemotherapy   FOLFOX q2weeks starting 08/31/19. Removed 5FU bolus and increased steroids pre-meds due to thrombocytopenia and skin rash starting with C4. Completed 12/07/19    11/21/2019 Imaging   CT AP W contrast  IMPRESSION: 1. Stable exam. No evidence for metastatic disease within the abdomen or pelvis. 2. Splenomegaly.  New from previous exam. 3. Gallstones.   Aortic Atherosclerosis (ICD10-I70.0).    Chemotherapy   PENDING chemoRT with Xeloda '2000mg'$  in the AM and '1500mg'$  in the PM on days of RT    Radiation Therapy   PENDING chemoRT with Dr MoLisbeth Renshaw     CURRENT THERAPY:  -FOLFOX q2weeks starting 08/31/19. Removed 5FU bolus and increased steroids pre-meds due to thrombocytopenia and skin rash starting with C4. Completed 12/07/19  -PENDING chemoRT with Xeloda '2000mg'$  in the AM and '1500mg'$  in the PM on days of RT  INTERVAL  HISTORY:  Joshua Zhang is here for a follow up and treatment. He presents to the clinic alone. He notes he is doing well. He plans to do RT Simulation next week. He notes he tolerated last cycle chemo well. His numbness and  tingling resolves after a few days. He denies issues picking things up or walking. He has been able to maintain eating and energy.    REVIEW OF SYSTEMS:   Constitutional: Denies fevers, chills or abnormal weight loss Eyes: Denies blurriness of vision Ears, nose, mouth, throat, and face: Denies mucositis or sore throat Respiratory: Denies cough, dyspnea or wheezes Cardiovascular: Denies palpitation, chest discomfort or lower extremity swelling Gastrointestinal:  Denies nausea, heartburn or change in bowel habits Skin: Denies abnormal skin rashes Lymphatics: Denies new lymphadenopathy or easy bruising Neurological:Denies numbness, tingling or new weaknesses Behavioral/Psych: Mood is stable, no new changes  All other systems were reviewed with the patient and are negative.  MEDICAL HISTORY:  Past Medical History:  Diagnosis Date  . Hypertension   . rectal ca dx'd 06/2019    SURGICAL HISTORY: Past Surgical History:  Procedure Laterality Date  . APPENDECTOMY     49 years old   . IR IMAGING GUIDED PORT INSERTION  08/30/2019  . SHOULDER SURGERY Bilateral 49 years old    I have reviewed the social history and family history with the patient and they are unchanged from previous note.  ALLERGIES:  has No Known Allergies.  MEDICATIONS:  Current Outpatient Medications  Medication Sig Dispense Refill  . amLODipine (NORVASC) 5 MG tablet Take by mouth.    Marland Kitchen atorvastatin (LIPITOR) 10 MG tablet Take by mouth.    Marland Kitchen BIKTARVY 50-200-25 MG TABS tablet TAKE 1 TABLET BY MOUTH ONCE DAILY 30 tablet 5  . capecitabine (XELODA) 500 MG tablet Take 4 tabs in morning and 3 tabs in evening, on the days of radiation, Monday through Friday 105 tablet 1  . lidocaine-prilocaine (EMLA) cream Apply to affected area once 30 g 3  . losartan (COZAAR) 25 MG tablet Take by mouth.    . Multiple Vitamin (MULTIVITAMIN) tablet Take 1 tablet by mouth daily.    . ondansetron (ZOFRAN) 8 MG tablet Take 1 tablet (8 mg  total) by mouth 2 (two) times daily as needed for refractory nausea / vomiting. Start on day 3 after chemotherapy. 30 tablet 1  . prochlorperazine (COMPAZINE) 10 MG tablet Take 1 tablet (10 mg total) by mouth every 6 (six) hours as needed (Nausea or vomiting). 30 tablet 1   No current facility-administered medications for this visit.    PHYSICAL EXAMINATION: ECOG PERFORMANCE STATUS: 1 - Symptomatic but completely ambulatory  Vitals:   12/07/19 0810  BP: (!) 137/93  Pulse: 77  Resp: 18  Temp: (!) 97.5 F (36.4 C)  SpO2: 100%   Filed Weights   12/07/19 0810  Weight: 209 lb 9.6 oz (95.1 kg)    Due to COVID19 we will limit examination to appearance. Patient had no complaints.  GENERAL:alert, no distress and comfortable SKIN: skin color normal, no rashes or significant lesions EYES: normal, Conjunctiva are pink and non-injected, sclera clear  NEURO: alert & oriented x 3 with fluent speech   LABORATORY DATA:  I have reviewed the data as listed CBC Latest Ref Rng & Units 12/07/2019 11/23/2019 11/09/2019  WBC 4.0 - 10.5 K/uL 8.4 7.4 13.8(H)  Hemoglobin 13.0 - 17.0 g/dL 13.9 13.5 14.1  Hematocrit 39.0 - 52.0 % 39.2 38.7(L) 40.1  Platelets 150 -  400 K/uL 96(L) 105(L) 123(L)     CMP Latest Ref Rng & Units 12/07/2019 11/23/2019 11/09/2019  Glucose 70 - 99 mg/dL 150(H) 135(H) 148(H)  BUN 6 - 20 mg/dL '14 9 12  '$ Creatinine 0.61 - 1.24 mg/dL 0.91 0.87 0.97  Sodium 135 - 145 mmol/L 140 143 141  Potassium 3.5 - 5.1 mmol/L 4.0 3.9 3.9  Chloride 98 - 111 mmol/L 108 109 108  CO2 22 - 32 mmol/L 21(L) 22 22  Calcium 8.9 - 10.3 mg/dL 8.9 8.8(L) 8.9  Total Protein 6.5 - 8.1 g/dL 6.9 6.7 6.8  Total Bilirubin 0.3 - 1.2 mg/dL 0.8 0.7 0.7  Alkaline Phos 38 - 126 U/L 232(H) 203(H) 207(H)  AST 15 - 41 U/L 64(H) 59(H) 71(H)  ALT 0 - 44 U/L 60(H) 49(H) 62(H)      RADIOGRAPHIC STUDIES: I have personally reviewed the radiological images as listed and agreed with the findings in the report. No results  found.   ASSESSMENT & PLAN:  Joshua Zhang is a 49 y.o. male with    1.Low rectaladenocarcinoma,cT3N1M0,Stage IIIB, MMR normal -He presented with intermittent rectal bleeding for 2 years, but otherwise asymptomatic, this was discovered on screening colonoscopyin 06/2019. -Colonoscopy and scan shows he has low rectum mass which is close to sphincter. Biopsy from screening colonoscopy showed this is moderately-differentiated adenocarcinoma locally advanced. Based on MRI, this is cT3N1 stage III disease.  -Istarted him ontotal neoadjuvant chemotherapywithFOLFOXq2weeks for 8 cycles on 08/31/19.This will be followed bychemoRT with Xeloda before proceeding with curative surgery.5FU bolus was removed starting with C4 due to thrombocytopenia. -S/p C7 he tolerated well with neuropathy that resolved after a few days. He also has thrombocytopenia.  -Labs reviewed, MCV 101.8, plt 96K. Overall adequate to proceed with Final C8 FOLFOX with dose reduction due to thrombocytopenia.  -He will proceed with concurrent chemoRT soon with Xeloda '2000mg'$  in the AM and '1500mg'$  in the PM BID on days of radiation. I again reviewed side effects from Xeloda with him and he agrees to proceed.  -F/u with the start of chemoRT   2. Rectal bleeding, secondary to #1 -Has been intermittent and stable for the last 2 years.  -He has had loose stool once daily lately. -no anemia -Will monitor on neoadjuvant treatment. -resolved now   3. HIV, controlled -He denies h/o or current use of recreational or IV drugs. He is unsure how he initially got infected.  -Controlled and undetectable. 09/15/18 CD4 was 350.His3/22/21 CD4 T cells is 708 -He is on Biktavy. This is managed by Dr. Megan Salon.   -He has received both his COVID19 vaccines. I discussed given his HIV and being on chemo his immune response may not be as effective. I discussed if a booster vaccine becomes available he would be a good candidate.      4. HLD, HTN -He will continue medications and f/u with PCP -His BPcontinues to beelevated in clinic but per pt normal at home. I encouraged him to continue to monitor and continue medications.He takes his medications in the afternoon.  5. Gout  -Patient feels he has gout of right anterior foot. This did occur 2 months ago and went away on its own.  -I recommend he drink plenty of water and take ibuprofen.  6. Thrombocytopenia  -Secondary to chemo FOLFOX -Removed 5FU bolus starting with C4.  -Plt further dropped to 96K after C7, will dose reduce for C8.    PLAN: -I called in Xeloda to start first day of RT,date  is pending  -Labs reviewed and adequate to proceed with C8 FOLFOX with dose reduction due to worsneing thrombocytopenia  -F/u with start of chemoRT   No problem-specific Assessment & Plan notes found for this encounter.   No orders of the defined types were placed in this encounter.  All questions were answered. The patient knows to call the clinic with any problems, questions or concerns. No barriers to learning was detected. The total time spent in the appointment was 30 minutes.     Truitt Merle, MD 12/07/2019   I, Joslyn Devon, am acting as scribe for Truitt Merle, MD.   I have reviewed the above documentation for accuracy and completeness, and I agree with the above.

## 2019-12-06 NOTE — Progress Notes (Signed)
Patient's wife calls stating they have appointments scheduled tomorrow 5/20 with Rad/Onc.  She and the patient feel that is too much on him the day of his treatment.  I spoke with Dr. Burr Medico and she states it is okay to reschedule to another date.  I sent Shona Simpson PA in RT a message and I have messaged their scheduler to reschedule to either one day the end of next week or the beginning of the following week.  The patient's wife is aware she will be receiving a phone call with new date and time.

## 2019-12-07 ENCOUNTER — Ambulatory Visit: Payer: BC Managed Care – PPO | Admitting: Radiation Oncology

## 2019-12-07 ENCOUNTER — Inpatient Hospital Stay: Payer: BC Managed Care – PPO

## 2019-12-07 ENCOUNTER — Ambulatory Visit: Payer: BC Managed Care – PPO

## 2019-12-07 ENCOUNTER — Other Ambulatory Visit: Payer: Self-pay

## 2019-12-07 ENCOUNTER — Inpatient Hospital Stay: Payer: BC Managed Care – PPO | Admitting: Hematology

## 2019-12-07 ENCOUNTER — Encounter: Payer: Self-pay | Admitting: Hematology

## 2019-12-07 VITALS — BP 137/93 | HR 77 | Temp 97.5°F | Resp 18 | Ht 70.0 in | Wt 209.6 lb

## 2019-12-07 DIAGNOSIS — R202 Paresthesia of skin: Secondary | ICD-10-CM | POA: Diagnosis not present

## 2019-12-07 DIAGNOSIS — R161 Splenomegaly, not elsewhere classified: Secondary | ICD-10-CM | POA: Diagnosis not present

## 2019-12-07 DIAGNOSIS — Z95828 Presence of other vascular implants and grafts: Secondary | ICD-10-CM

## 2019-12-07 DIAGNOSIS — I700 Atherosclerosis of aorta: Secondary | ICD-10-CM | POA: Diagnosis not present

## 2019-12-07 DIAGNOSIS — D696 Thrombocytopenia, unspecified: Secondary | ICD-10-CM | POA: Diagnosis not present

## 2019-12-07 DIAGNOSIS — D123 Benign neoplasm of transverse colon: Secondary | ICD-10-CM | POA: Diagnosis not present

## 2019-12-07 DIAGNOSIS — I10 Essential (primary) hypertension: Secondary | ICD-10-CM | POA: Diagnosis not present

## 2019-12-07 DIAGNOSIS — D124 Benign neoplasm of descending colon: Secondary | ICD-10-CM | POA: Diagnosis not present

## 2019-12-07 DIAGNOSIS — R21 Rash and other nonspecific skin eruption: Secondary | ICD-10-CM | POA: Diagnosis not present

## 2019-12-07 DIAGNOSIS — B20 Human immunodeficiency virus [HIV] disease: Secondary | ICD-10-CM | POA: Diagnosis not present

## 2019-12-07 DIAGNOSIS — C20 Malignant neoplasm of rectum: Secondary | ICD-10-CM

## 2019-12-07 DIAGNOSIS — Z5111 Encounter for antineoplastic chemotherapy: Secondary | ICD-10-CM | POA: Diagnosis not present

## 2019-12-07 DIAGNOSIS — M109 Gout, unspecified: Secondary | ICD-10-CM | POA: Diagnosis not present

## 2019-12-07 DIAGNOSIS — R200 Anesthesia of skin: Secondary | ICD-10-CM | POA: Diagnosis not present

## 2019-12-07 DIAGNOSIS — D122 Benign neoplasm of ascending colon: Secondary | ICD-10-CM | POA: Diagnosis not present

## 2019-12-07 DIAGNOSIS — Z5189 Encounter for other specified aftercare: Secondary | ICD-10-CM | POA: Diagnosis not present

## 2019-12-07 DIAGNOSIS — K8020 Calculus of gallbladder without cholecystitis without obstruction: Secondary | ICD-10-CM | POA: Diagnosis not present

## 2019-12-07 DIAGNOSIS — D125 Benign neoplasm of sigmoid colon: Secondary | ICD-10-CM | POA: Diagnosis not present

## 2019-12-07 LAB — CBC WITH DIFFERENTIAL (CANCER CENTER ONLY)
Abs Immature Granulocytes: 0.2 10*3/uL — ABNORMAL HIGH (ref 0.00–0.07)
Basophils Absolute: 0 10*3/uL (ref 0.0–0.1)
Basophils Relative: 0 %
Eosinophils Absolute: 0.2 10*3/uL (ref 0.0–0.5)
Eosinophils Relative: 2 %
HCT: 39.2 % (ref 39.0–52.0)
Hemoglobin: 13.9 g/dL (ref 13.0–17.0)
Immature Granulocytes: 2 %
Lymphocytes Relative: 14 %
Lymphs Abs: 1.2 10*3/uL (ref 0.7–4.0)
MCH: 36.1 pg — ABNORMAL HIGH (ref 26.0–34.0)
MCHC: 35.5 g/dL (ref 30.0–36.0)
MCV: 101.8 fL — ABNORMAL HIGH (ref 80.0–100.0)
Monocytes Absolute: 0.7 10*3/uL (ref 0.1–1.0)
Monocytes Relative: 8 %
Neutro Abs: 6.1 10*3/uL (ref 1.7–7.7)
Neutrophils Relative %: 74 %
Platelet Count: 96 10*3/uL — ABNORMAL LOW (ref 150–400)
RBC: 3.85 MIL/uL — ABNORMAL LOW (ref 4.22–5.81)
RDW: 14.6 % (ref 11.5–15.5)
WBC Count: 8.4 10*3/uL (ref 4.0–10.5)
nRBC: 0 % (ref 0.0–0.2)

## 2019-12-07 LAB — CMP (CANCER CENTER ONLY)
ALT: 60 U/L — ABNORMAL HIGH (ref 0–44)
AST: 64 U/L — ABNORMAL HIGH (ref 15–41)
Albumin: 3.4 g/dL — ABNORMAL LOW (ref 3.5–5.0)
Alkaline Phosphatase: 232 U/L — ABNORMAL HIGH (ref 38–126)
Anion gap: 11 (ref 5–15)
BUN: 14 mg/dL (ref 6–20)
CO2: 21 mmol/L — ABNORMAL LOW (ref 22–32)
Calcium: 8.9 mg/dL (ref 8.9–10.3)
Chloride: 108 mmol/L (ref 98–111)
Creatinine: 0.91 mg/dL (ref 0.61–1.24)
GFR, Est AFR Am: >60 mL/min (ref >60)
GFR, Estimated: >60 mL/min (ref >60)
Glucose, Bld: 150 mg/dL — ABNORMAL HIGH (ref 70–99)
Potassium: 4 mmol/L (ref 3.5–5.1)
Sodium: 140 mmol/L (ref 135–145)
Total Bilirubin: 0.8 mg/dL (ref 0.3–1.2)
Total Protein: 6.9 g/dL (ref 6.5–8.1)

## 2019-12-07 MED ORDER — SODIUM CHLORIDE 0.9 % IV SOLN
2400.0000 mg/m2 | INTRAVENOUS | Status: DC
  Administered 2019-12-07: 5300 mg via INTRAVENOUS
  Filled 2019-12-07: qty 106

## 2019-12-07 MED ORDER — PALONOSETRON HCL INJECTION 0.25 MG/5ML
0.2500 mg | Freq: Once | INTRAVENOUS | Status: AC
  Administered 2019-12-07: 0.25 mg via INTRAVENOUS

## 2019-12-07 MED ORDER — SODIUM CHLORIDE 0.9 % IV SOLN
15.0000 mg | Freq: Once | INTRAVENOUS | Status: AC
  Administered 2019-12-07: 15 mg via INTRAVENOUS
  Filled 2019-12-07: qty 1.5

## 2019-12-07 MED ORDER — CAPECITABINE 500 MG PO TABS
ORAL_TABLET | ORAL | 1 refills | Status: DC
Start: 2019-12-07 — End: 2019-12-07

## 2019-12-07 MED ORDER — DEXTROSE 5 % IV SOLN
Freq: Once | INTRAVENOUS | Status: AC
  Filled 2019-12-07: qty 250

## 2019-12-07 MED ORDER — SODIUM CHLORIDE 0.9% FLUSH
10.0000 mL | INTRAVENOUS | Status: DC | PRN
  Administered 2019-12-07: 10 mL
  Filled 2019-12-07: qty 10

## 2019-12-07 MED ORDER — LEUCOVORIN CALCIUM INJECTION 350 MG
400.0000 mg/m2 | Freq: Once | INTRAVENOUS | Status: AC
  Administered 2019-12-07: 880 mg via INTRAVENOUS
  Filled 2019-12-07: qty 44

## 2019-12-07 MED ORDER — OXALIPLATIN CHEMO INJECTION 100 MG/20ML
68.0000 mg/m2 | Freq: Once | INTRAVENOUS | Status: AC
  Administered 2019-12-07: 150 mg via INTRAVENOUS
  Filled 2019-12-07: qty 30

## 2019-12-07 MED ORDER — PALONOSETRON HCL INJECTION 0.25 MG/5ML
INTRAVENOUS | Status: AC
  Filled 2019-12-07: qty 5

## 2019-12-07 MED ORDER — CAPECITABINE 500 MG PO TABS: ORAL_TABLET | ORAL | 1 refills | Status: DC

## 2019-12-07 NOTE — Patient Instructions (Signed)
[facility] Discharge Instructions for Patients Receiving Chemotherapy  Today you received the following chemotherapy agents: Leucovorin, Oxaliplatin, 5FU  To help prevent nausea and vomiting after your treatment, we encourage you to take your nausea medication as directed.   If you develop nausea and vomiting that is not controlled by your nausea medication, call the clinic.   BELOW ARE SYMPTOMS THAT SHOULD BE REPORTED IMMEDIATELY:  *FEVER GREATER THAN 100.5 F  *CHILLS WITH OR WITHOUT FEVER  NAUSEA AND VOMITING THAT IS NOT CONTROLLED WITH YOUR NAUSEA MEDICATION  *UNUSUAL SHORTNESS OF BREATH  *UNUSUAL BRUISING OR BLEEDING  TENDERNESS IN MOUTH AND THROAT WITH OR WITHOUT PRESENCE OF ULCERS  *URINARY PROBLEMS  *BOWEL PROBLEMS  UNUSUAL RASH Items with * indicate a potential emergency and should be followed up as soon as possible.  Feel free to call the clinic should you have any questions or concerns. The clinic phone number is [phone].  Please show the [place] at check-in to the Emergency Department and triage nurse.

## 2019-12-09 ENCOUNTER — Inpatient Hospital Stay: Payer: BC Managed Care – PPO

## 2019-12-09 ENCOUNTER — Other Ambulatory Visit: Payer: Self-pay

## 2019-12-09 VITALS — BP 134/84 | HR 64 | Temp 99.2°F | Resp 20

## 2019-12-09 DIAGNOSIS — R202 Paresthesia of skin: Secondary | ICD-10-CM | POA: Diagnosis not present

## 2019-12-09 DIAGNOSIS — M109 Gout, unspecified: Secondary | ICD-10-CM | POA: Diagnosis not present

## 2019-12-09 DIAGNOSIS — D123 Benign neoplasm of transverse colon: Secondary | ICD-10-CM | POA: Diagnosis not present

## 2019-12-09 DIAGNOSIS — C20 Malignant neoplasm of rectum: Secondary | ICD-10-CM

## 2019-12-09 DIAGNOSIS — R161 Splenomegaly, not elsewhere classified: Secondary | ICD-10-CM | POA: Diagnosis not present

## 2019-12-09 DIAGNOSIS — D696 Thrombocytopenia, unspecified: Secondary | ICD-10-CM | POA: Diagnosis not present

## 2019-12-09 DIAGNOSIS — R200 Anesthesia of skin: Secondary | ICD-10-CM | POA: Diagnosis not present

## 2019-12-09 DIAGNOSIS — R21 Rash and other nonspecific skin eruption: Secondary | ICD-10-CM | POA: Diagnosis not present

## 2019-12-09 DIAGNOSIS — K8020 Calculus of gallbladder without cholecystitis without obstruction: Secondary | ICD-10-CM | POA: Diagnosis not present

## 2019-12-09 DIAGNOSIS — Z5111 Encounter for antineoplastic chemotherapy: Secondary | ICD-10-CM | POA: Diagnosis not present

## 2019-12-09 DIAGNOSIS — Z5189 Encounter for other specified aftercare: Secondary | ICD-10-CM | POA: Diagnosis not present

## 2019-12-09 DIAGNOSIS — I10 Essential (primary) hypertension: Secondary | ICD-10-CM | POA: Diagnosis not present

## 2019-12-09 DIAGNOSIS — D125 Benign neoplasm of sigmoid colon: Secondary | ICD-10-CM | POA: Diagnosis not present

## 2019-12-09 DIAGNOSIS — D124 Benign neoplasm of descending colon: Secondary | ICD-10-CM | POA: Diagnosis not present

## 2019-12-09 DIAGNOSIS — I700 Atherosclerosis of aorta: Secondary | ICD-10-CM | POA: Diagnosis not present

## 2019-12-09 DIAGNOSIS — D122 Benign neoplasm of ascending colon: Secondary | ICD-10-CM | POA: Diagnosis not present

## 2019-12-09 MED ORDER — HEPARIN SOD (PORK) LOCK FLUSH 100 UNIT/ML IV SOLN
500.0000 [IU] | Freq: Once | INTRAVENOUS | Status: AC | PRN
  Administered 2019-12-09: 500 [IU]
  Filled 2019-12-09: qty 5

## 2019-12-09 MED ORDER — SODIUM CHLORIDE 0.9% FLUSH
10.0000 mL | INTRAVENOUS | Status: DC | PRN
  Administered 2019-12-09: 10 mL
  Filled 2019-12-09: qty 10

## 2019-12-09 MED ORDER — PEGFILGRASTIM-CBQV 6 MG/0.6ML ~~LOC~~ SOSY
6.0000 mg | PREFILLED_SYRINGE | Freq: Once | SUBCUTANEOUS | Status: AC
  Administered 2019-12-09: 6 mg via SUBCUTANEOUS

## 2019-12-09 NOTE — Patient Instructions (Signed)

## 2019-12-11 ENCOUNTER — Ambulatory Visit: Payer: BC Managed Care – PPO | Admitting: Internal Medicine

## 2019-12-13 ENCOUNTER — Ambulatory Visit
Admission: RE | Admit: 2019-12-13 | Discharge: 2019-12-13 | Disposition: A | Discharge status: Home / Self Care | Payer: BC Managed Care – PPO | Source: Ambulatory Visit | Attending: Radiation Oncology | Admitting: Radiation Oncology

## 2019-12-13 ENCOUNTER — Ambulatory Visit: Payer: BC Managed Care – PPO

## 2019-12-13 ENCOUNTER — Encounter: Payer: Self-pay | Admitting: Radiation Oncology

## 2019-12-13 ENCOUNTER — Ambulatory Visit: Payer: BC Managed Care – PPO | Admitting: Radiation Oncology

## 2019-12-13 ENCOUNTER — Other Ambulatory Visit: Payer: Self-pay

## 2019-12-13 VITALS — BP 146/91 | HR 87 | Temp 98.4°F | Resp 18 | Ht 70.0 in | Wt 199.4 lb

## 2019-12-13 DIAGNOSIS — C20 Malignant neoplasm of rectum: Secondary | ICD-10-CM | POA: Diagnosis not present

## 2019-12-13 DIAGNOSIS — Z21 Asymptomatic human immunodeficiency virus [HIV] infection status: Secondary | ICD-10-CM | POA: Diagnosis not present

## 2019-12-13 DIAGNOSIS — R161 Splenomegaly, not elsewhere classified: Secondary | ICD-10-CM | POA: Diagnosis not present

## 2019-12-13 NOTE — Progress Notes (Signed)
Radiation Oncology         [phone] ________________________________  Name: Joshua Zhang        MRN: [id number]  Date of Service: 12/13/2019 DOB: 04-06-1971  CC:Orson Aloe, MD     REFERRING PHYSICIAN: Leighton Ruff, MD, Dr. Michel Bickers   DIAGNOSIS: The encounter diagnosis was Rectal adenocarcinoma [facility]).   HISTORY OF PRESENT ILLNESS: Joshua Zhang is a 49 y.o. male with a recently diagnosed cancer of the distal rectum.  The patient has been experiencing symptoms of bleeding for approximately 2 years off and on, his symptoms progressed and he was seen by GI.  He underwent colonoscopy with Dr. Collene Mares recently, the date of his procedure and pathology report are unavailable for review but per Dr. Manon Hilding notes this revealed multiple polyps throughout the colon and the lesion in the distal rectum, biopsies confirmed adenocarcinoma within the rectal lesion.  He did undergo CT imaging of the chest abdomen and pelvis on 07/12/2019 which were negative for metastatic disease.  He did undergo an MRI on 08/02/2019 revealing a T3c lesion in the distal rectum with nodal involvement classified as N1 disease.  He met with Dr. Marcello Moores and is aware the location of his tumor being possibly problematic as it abuts the internal sphincter complex, he may require APR but will determine next steps with her following neoadjuvant therapy.  His CEA was 2.5.  He met Dr. Burr Medico and proceeded with total neoadjuvant chemotherapy between 08/31/19-12/07/19. His most recent CT abdomen on 11/21/19 revealed no evidence of metastatic disease within the abdomen or pelvis, there was new splenomegaly. He's seen today to prepare for chemoRT.   PREVIOUS RADIATION THERAPY: No   PAST MEDICAL HISTORY:  Past Medical History:  Diagnosis Date  . Hypertension   . rectal ca dx'd 06/2019       PAST SURGICAL HISTORY: Past Surgical History:  Procedure Laterality Date  . APPENDECTOMY     49 years  old   . IR IMAGING GUIDED PORT INSERTION  08/30/2019  . SHOULDER SURGERY Bilateral 49 years old     FAMILY HISTORY:  Family History  Problem Relation Age of Onset  . Breast cancer Mother   . Aortic aneurysm Father      SOCIAL HISTORY:  reports that he has never smoked. He has never used smokeless tobacco. He reports current alcohol use of about 24.0 standard drinks of alcohol per week. He reports current drug use. Drug: Opium. The patient is married and lives in [place]. He works in Data processing manager for [employer].   ALLERGIES: Patient has no known allergies.   MEDICATIONS:  Current Outpatient Medications  Medication Sig Dispense Refill  . amLODipine (NORVASC) 5 MG tablet Take by mouth.    Marland Kitchen atorvastatin (LIPITOR) 10 MG tablet Take by mouth.    Marland Kitchen BIKTARVY 50-200-25 MG TABS tablet TAKE 1 TABLET BY MOUTH ONCE DAILY 30 tablet 5  . capecitabine (XELODA) 500 MG tablet Take 4 tabs in morning and 3 tabs in evening, on the days of radiation, Monday through Friday 105 tablet 1  . lidocaine-prilocaine (EMLA) cream Apply to affected area once 30 g 3  . losartan (COZAAR) 25 MG tablet Take by mouth.    . Multiple Vitamin (MULTIVITAMIN) tablet Take 1 tablet by mouth daily.    . ondansetron (ZOFRAN) 8 MG tablet Take 1 tablet (8 mg total) by mouth 2 (two) times daily as needed for refractory nausea / vomiting.  Start on day 3 after chemotherapy. 30 tablet 1  . prochlorperazine (COMPAZINE) 10 MG tablet Take 1 tablet (10 mg total) by mouth every 6 (six) hours as needed (Nausea or vomiting). 30 tablet 1   No current facility-administered medications for this encounter.     REVIEW OF SYSTEMS: On review of systems, the patient reports that he is doing well overall. He reports he is feeling quite well and other than fatigue during chemo he felt like he tolerated side effects well. He reports he is voiding and passing stool without difficulty. No bleeding, or pain is currently noted.  He did have some discomfort with rectal burning on days when he received chemotherapy. No other complaints are verbalized.   PHYSICAL EXAM:  Wt Readings from Last 3 Encounters:  12/13/19 199 lb 6 oz (90.4 kg)  12/07/19 209 lb 9.6 oz (95.1 kg)  11/23/19 204 lb 3.2 oz (92.6 kg)   Temp Readings from Last 3 Encounters:  12/13/19 98.4 F (36.9 C) (Temporal)  12/09/19 99.2 F (37.3 C) (Temporal)  12/07/19 (!) 97.5 F (36.4 C)   BP Readings from Last 3 Encounters:  12/13/19 (!) 146/91  12/09/19 134/84  12/07/19 (!) 137/93   Pulse Readings from Last 3 Encounters:  12/13/19 87  12/09/19 64  12/07/19 77   In general this is a well appearing caucasian male in no acute distress. He's alert and oriented x4 and appropriate throughout the examination. Cardiopulmonary assessment is negative for acute distress and he exhibits normal effort.    ECOG = 0  0 - Asymptomatic (Fully active, able to carry on all predisease activities without restriction)  1 - Symptomatic but completely ambulatory (Restricted in physically strenuous activity but ambulatory and able to carry out work of a light or sedentary nature. For example, light housework, office work)  2 - Symptomatic, <50% in bed during the day (Ambulatory and capable of all self care but unable to carry out any work activities. Up and about more than 50% of waking hours)  3 - Symptomatic, >50% in bed, but not bedbound (Capable of only limited self-care, confined to bed or chair 50% or more of waking hours)  4 - Bedbound (Completely disabled. Cannot carry on any self-care. Totally confined to bed or chair)  5 - Death   Eustace Pen MM, Creech RH, Tormey DC, et al. [phone]). "Toxicity and response criteria of the [facility]". [place] Oncol. 5 (6): 649-55    LABORATORY DATA:  Lab Results  Component Value Date   WBC 8.4 12/07/2019   HGB 13.9 12/07/2019   HCT 39.2 12/07/2019   MCV 101.8 (H) 12/07/2019   PLT 96 (L)  12/07/2019   Lab Results  Component Value Date   NA 140 12/07/2019   K 4.0 12/07/2019   CL 108 12/07/2019   CO2 21 (L) 12/07/2019   Lab Results  Component Value Date   ALT 60 (H) 12/07/2019   AST 64 (H) 12/07/2019   ALKPHOS 232 (H) 12/07/2019   BILITOT 0.8 12/07/2019      RADIOGRAPHY: CT Abdomen Pelvis W Contrast  Result Date: 11/21/2019 CLINICAL DATA:  Colorectal carcinoma. Restaging. EXAM: CT ABDOMEN AND PELVIS WITH CONTRAST TECHNIQUE: Multidetector CT imaging of the abdomen and pelvis was performed using the standard protocol following bolus administration of intravenous contrast. CONTRAST:  140m OMNIPAQUE IOHEXOL 300 MG/ML  SOLN COMPARISON:  07/12/2019 FINDINGS: Lower chest: No acute abnormality. Several calcified granulomas are identified in the right lung. Calcified right  hilar and subcarinal lymph nodes identified compatible with prior granulomatous disease. Noncalcified nodule within the anterolateral right lower lobe measures 4 mm and is unchanged from 07/12/2019 Hepatobiliary: No suspicious liver lesion. Multiple small stones identified within the gallbladder. No gallbladder inflammation or biliary ductal dilatation Pancreas: Unremarkable. No pancreatic ductal dilatation or surrounding inflammatory changes. Spleen: The spleen is enlarged on today's study measuring 15.0 by 8.2 x 11.7 cm (volume = 750 cm^3), image 27/2. Adrenals/Urinary Tract: Normal appearance of the adrenal glands. The left kidney appears normal. Right kidney cyst measures 1.4 cm. Urinary bladder is unremarkable. Stomach/Bowel: Stomach appears normal. No dilated loops of small bowel. No bowel wall thickening, inflammation or distension. Distal colonic diverticulosis noted without acute inflammation. Vascular/Lymphatic: Aortic atherosclerosis. No abdominal adenopathy. No iliac adenopathy. Upper limits of normal in size inguinal lymph nodes are identified. Index right inguinal lymph node measures 1 cm, image 91/2. Stable  from previous exam. Previous right posterior perirectal lymph node has resolved in the interval. Left posterior perirectal lymph node measures 4 mm, image 79/2. Previously 5 mm. Reproductive: Prostate is unremarkable. Other: No free fluid or fluid collections. No peritoneal nodule or mass. Musculoskeletal: No acute or significant osseous findings. IMPRESSION: 1. Stable exam. No evidence for metastatic disease within the abdomen or pelvis. 2. Splenomegaly.  New from previous exam. 3. Gallstones. Aortic Atherosclerosis (ICD10-I70.0). Electronically Signed   By: Kerby Moors M.D.   On: 11/21/2019 09:27       IMPRESSION/PLAN: 1. Stage IIIB, cT3N1M0 adenocarcinoma of the distal rectum. The patient has done well since receiving total neoadjuvant chemotherapy. Dr. Lisbeth Renshaw reviews his course since our last visit and reviewed the rationale for chemoRT followed by surgical resection. We discussed the risks, benefits, short, and long term effects of radiotherapy, and the patient is interested in proceeding. Dr. Lisbeth Renshaw discusses the delivery and logistics of radiotherapy and anticipates a course of 5 1/2 weeks of radiotherapy. Written consent is obtained and placed in the chart, a copy was provided to the patient. He will simulate today and we anticipate starting his treatment on 12/25/19 2. HIV infection. The patient has an undetectable viral load and is followed by Dr. Megan Salon.   In a visit lasting 45 minutes, greater than 50% of the time was spent face to face discussing the patient's condition, in preparation for the discussion, and coordinating the patient's care.   The above documentation reflects my direct findings during this shared patient visit. Please see the separate note by Dr. Lisbeth Renshaw on this date for the remainder of the patient's plan of care.    Carola Rhine, PAC

## 2019-12-15 ENCOUNTER — Other Ambulatory Visit: Payer: Self-pay | Admitting: Internal Medicine

## 2019-12-15 ENCOUNTER — Other Ambulatory Visit: Payer: Self-pay | Admitting: Infectious Diseases

## 2019-12-19 ENCOUNTER — Ambulatory Visit: Payer: BC Managed Care – PPO | Admitting: Internal Medicine

## 2019-12-19 ENCOUNTER — Encounter: Payer: Self-pay | Admitting: Internal Medicine

## 2019-12-19 ENCOUNTER — Other Ambulatory Visit: Payer: Self-pay

## 2019-12-19 DIAGNOSIS — B20 Human immunodeficiency virus [HIV] disease: Secondary | ICD-10-CM | POA: Diagnosis not present

## 2019-12-19 NOTE — Assessment & Plan Note (Signed)
His infection remains under very good control.  He had a slight dip in his CD4 count 1 for starting chemotherapy but has now reconstituted back to normal.  He will continue Biktarvy and follow-up after lab work in 6 months.

## 2019-12-19 NOTE — Progress Notes (Signed)
Patient Active Problem List   Diagnosis Date Noted  . Rectal adenocarcinoma ([place]) 08/21/2019    Priority: High  . HIV disease ([place]) 11/11/2016    Priority: High  . Port-A-Cath in place 10/12/2019  . Injury of tendon of triceps 03/03/2019  . Mixed hyperlipidemia 07/26/2018  . Low back pain 09/29/2017  . Essential hypertension 04/01/2017  . Renal insufficiency 03/31/2017  . Dyslipidemia 03/31/2017  . Tinea versicolor 04/22/2016    Patient's Medications  New Prescriptions   No medications on file  Previous Medications   AMLODIPINE (NORVASC) 5 MG TABLET    Take by mouth.   ATORVASTATIN (LIPITOR) 10 MG TABLET    Take by mouth.   BIKTARVY 50-200-25 MG TABS TABLET    TAKE 1 TABLET BY MOUTH ONCE DAILY   CAPECITABINE (XELODA) 500 MG TABLET    Take 4 tabs in morning and 3 tabs in evening, on the days of radiation, Monday through Friday   LIDOCAINE-PRILOCAINE (EMLA) CREAM    Apply to affected area once   LOSARTAN (COZAAR) 25 MG TABLET    Take by mouth.   MULTIPLE VITAMIN (MULTIVITAMIN) TABLET    Take 1 tablet by mouth daily.   ONDANSETRON (ZOFRAN) 8 MG TABLET    Take 1 tablet (8 mg total) by mouth 2 (two) times daily as needed for refractory nausea / vomiting. Start on day 3 after chemotherapy.   PROCHLORPERAZINE (COMPAZINE) 10 MG TABLET    Take 1 tablet (10 mg total) by mouth every 6 (six) hours as needed (Nausea or vomiting).  Modified Medications   No medications on file  Discontinued Medications   No medications on file    Subjective: Kell is in for his routine HIV follow-up visit.  He denies any problems obtaining, taking or tolerating his Biktarvy and does not recall missing any doses.  He just completed chemotherapy for his recently diagnosed rectal cancer and will be starting radiation therapy next week.  He denies having any tolerating his chemotherapy.  He did have some thrombocytopenia and liver enzyme elevation.  He has completed his Covid vaccinations.  He  states that he is feeling well.  Review of Systems: Review of Systems  Constitutional: Negative for fever.  Psychiatric/Behavioral: Negative for depression.    Past Medical History:  Diagnosis Date  . Hypertension   . rectal ca dx'd 06/2019    Social History   Tobacco Use  . Smoking status: Never Smoker  . Smokeless tobacco: Never Used  . Tobacco comment: Smoked in 50s  Substance Use Topics  . Alcohol use: Yes    Alcohol/week: 24.0 standard drinks    Types: 12 Glasses of wine, 12 Cans of beer per week  . Drug use: Yes    Types: Opium    Family History  Problem Relation Age of Onset  . Breast cancer Mother   . Aortic aneurysm Father     No Known Allergies  Health Maintenance  Topic Date Due  . COVID-19 Vaccine (1) Never done  . COLONOSCOPY  Never done  . INFLUENZA VACCINE  02/18/2020  . TETANUS/TDAP  10/22/2022  . HIV Screening  Completed    Objective:  Vitals:   12/19/19 1537  BP: (!) 155/100  Pulse: 80  Temp: 97.9 F (36.6 C)  SpO2: 97%  Weight: 204 lb (92.5 kg)  Height: 5\' 10"  (1.778 m)   Body mass index is 29.27 kg/m.  Physical Exam Constitutional:  Comments: Is in good spirits as usual.  Cardiovascular:     Rate and Rhythm: Normal rate and regular rhythm.     Heart sounds: No murmur.  Pulmonary:     Effort: Pulmonary effort is normal.     Breath sounds: Normal breath sounds.  Skin:    Findings: No rash.  Psychiatric:        Mood and Affect: Mood normal.     Lab Results Lab Results  Component Value Date   WBC 8.4 12/07/2019   HGB 13.9 12/07/2019   HCT 39.2 12/07/2019   MCV 101.8 (H) 12/07/2019   PLT 96 (L) 12/07/2019    Lab Results  Component Value Date   CREATININE 0.91 12/07/2019   BUN 14 12/07/2019   NA 140 12/07/2019   K 4.0 12/07/2019   CL 108 12/07/2019   CO2 21 (L) 12/07/2019    Lab Results  Component Value Date   ALT 60 (H) 12/07/2019   AST 64 (H) 12/07/2019   ALKPHOS 232 (H) 12/07/2019   BILITOT 0.8  12/07/2019    Lab Results  Component Value Date   CHOL 173 08/23/2019   HDL 43 08/23/2019   LDLCALC 105 (H) 08/23/2019   TRIG 139 08/23/2019   CHOLHDL 4.0 08/23/2019   Lab Results  Component Value Date   LABRPR NON-REACTIVE 08/23/2019   HIV 1 RNA Quant (copies/mL)  Date Value  10/09/2019 39 (H)  08/23/2019 22 (H)  09/15/2018 21 (H)   CD4 T Cell Abs (/uL)  Date Value  10/09/2019 708  08/23/2019 562  09/15/2018 350 (L)     Problem List Items Addressed This Visit      High   HIV disease ([place])    His infection remains under very good control.  He had a slight dip in his CD4 count 1 for starting chemotherapy but has now reconstituted back to normal.  He will continue Biktarvy and follow-up after lab work in 6 months.      Relevant Orders   T-helper cell (CD4)- ([facility] clinic only)   HIV-1 RNA quant-no reflex-bld        Michel Bickers, MD [facility] [phone] pager   [phone] cell 12/19/2019, 3:59 PM

## 2019-12-20 ENCOUNTER — Telehealth: Payer: Self-pay

## 2019-12-20 ENCOUNTER — Telehealth: Payer: Self-pay | Admitting: Pharmacist

## 2019-12-20 DIAGNOSIS — C20 Malignant neoplasm of rectum: Secondary | ICD-10-CM

## 2019-12-20 MED ORDER — CAPECITABINE 500 MG PO TABS: ORAL_TABLET | ORAL | 1 refills | Status: DC

## 2019-12-20 NOTE — Telephone Encounter (Signed)
Oral Oncology Pharmacist Encounter  Received new prescription for Xeloda (capecitabine) for the treatment of stage IIIB rectal adenocarcinoma in conjunction with RT, planned duration until the end of radiation. Planned start along with RT on 12/25/19  CMP from 12/07/19 assessed, no relevant lab abnormalities. Prescription dose and frequency assessed.   Current medication list in Epic reviewed, no relevant DDIs with capecitabine identified.  Prescription has been e-scribed to the [facility] for benefits analysis and approval.  Oral Oncology Clinic will continue to follow for insurance authorization, copayment issues, initial counseling and start date.  Darl Pikes, PharmD, BCPS, BCOP, CPP Hematology/Oncology Clinical Pharmacist Practitioner [facility] [phone]  12/20/2019 1:14 PM

## 2019-12-20 NOTE — Telephone Encounter (Signed)
Oral Oncology Patient Advocate Encounter  After completing a benefits investigation, prior authorization for Xeloda is not required at this time through [employer].  Patient's copay is $200 for #98 only.    [place] Patient [place] Phone [phone] Fax [phone] 12/20/2019 2:15 PM

## 2019-12-21 MED FILL — CAPECITABINE 500 MG TABLET: 500 | 28 days supply | Qty: 98 | Fill #0

## 2019-12-21 NOTE — Telephone Encounter (Signed)
Oral Chemotherapy Pharmacist Encounter   Attempted to call patient for medication education. No answer LVM for patient to call me back.  Darl Pikes, PharmD, BCPS, BCOP, CPP Hematology/Oncology Clinical Pharmacist [facility] [place] [facility] [phone]  12/21/2019 4:44 PM

## 2019-12-24 DIAGNOSIS — C20 Malignant neoplasm of rectum: Secondary | ICD-10-CM | POA: Insufficient documentation

## 2019-12-25 ENCOUNTER — Ambulatory Visit
Admission: RE | Admit: 2019-12-25 | Discharge: 2019-12-25 | Disposition: A | Discharge status: Home / Self Care | Payer: BC Managed Care – PPO | Source: Ambulatory Visit | Attending: Radiation Oncology | Admitting: Radiation Oncology

## 2019-12-25 DIAGNOSIS — C20 Malignant neoplasm of rectum: Secondary | ICD-10-CM | POA: Diagnosis not present

## 2019-12-25 NOTE — Telephone Encounter (Signed)
Oral Chemotherapy Pharmacist Encounter  Joshua Zhang returned my call on 12/25/19. He had his first RT treatment and had his first dose of Xeloda.   Patient Education I spoke with patient for overview of new oral chemotherapy medication: Xeloda (capecitabine) for the treatment of stage IIIB rectal adenocarcinoma in conjunction with RT, planned duration until the end of radiation. Planned start along with RT on 12/25/19   Pt is doing well. Counseled patient on administration, dosing, side effects, monitoring, drug-food interactions, safe handling, storage, and disposal. Patient will take 4 tablets (2000mg ) by mouth in AM and 3 tablets (1500mg ) in PM. Take with food. Take Monday-Friday, only on days for radiation.  Side effects include but not limited to: diarrhea, hand-foot syndrome, N/V, fatigue, edema.    Reviewed with patient importance of keeping a medication schedule and plan for any missed doses.  Joshua Zhang voiced understanding and appreciation. All questions answered. Medication handout placed in the mail.  Provided patient with Oral [facility] phone number. Patient knows to call the office with questions or concerns. Oral Chemotherapy Navigation Clinic will continue to follow.  Darl Pikes, PharmD, BCPS, BCOP, CPP Hematology/Oncology Clinical Pharmacist Practitioner [facility] [phone]  12/25/2019 4:55 PM

## 2019-12-26 ENCOUNTER — Other Ambulatory Visit: Payer: Self-pay

## 2019-12-26 ENCOUNTER — Ambulatory Visit
Admission: RE | Admit: 2019-12-26 | Discharge: 2019-12-26 | Disposition: A | Discharge status: Home / Self Care | Payer: BC Managed Care – PPO | Source: Ambulatory Visit | Attending: Radiation Oncology | Admitting: Radiation Oncology

## 2019-12-26 ENCOUNTER — Telehealth: Payer: Self-pay

## 2019-12-26 DIAGNOSIS — C20 Malignant neoplasm of rectum: Secondary | ICD-10-CM | POA: Diagnosis not present

## 2019-12-26 NOTE — Telephone Encounter (Signed)
Oral Oncology Patient Advocate Encounter  Met patient in lobby to complete application for [place] in an effort to reduce patient's out of pocket expense for Xeloda to $0.    Application completed and faxed to [phone].   [employer] patient assistance phone number for follow up is [phone].   This encounter will be updated until final determination.    [place] Patient [place] Phone [phone] Fax [phone] 12/26/2019 2:21 PM

## 2019-12-27 ENCOUNTER — Other Ambulatory Visit: Payer: Self-pay

## 2019-12-27 ENCOUNTER — Ambulatory Visit
Admission: RE | Admit: 2019-12-27 | Discharge: 2019-12-27 | Disposition: A | Discharge status: Home / Self Care | Payer: BC Managed Care – PPO | Source: Ambulatory Visit | Attending: Radiation Oncology | Admitting: Radiation Oncology

## 2019-12-27 DIAGNOSIS — C20 Malignant neoplasm of rectum: Secondary | ICD-10-CM | POA: Diagnosis not present

## 2019-12-28 ENCOUNTER — Other Ambulatory Visit: Payer: Self-pay

## 2019-12-28 ENCOUNTER — Ambulatory Visit
Admission: RE | Admit: 2019-12-28 | Discharge: 2019-12-28 | Disposition: A | Discharge status: Home / Self Care | Payer: BC Managed Care – PPO | Source: Ambulatory Visit | Attending: Radiation Oncology | Admitting: Radiation Oncology

## 2019-12-28 DIAGNOSIS — C20 Malignant neoplasm of rectum: Secondary | ICD-10-CM | POA: Diagnosis not present

## 2019-12-28 NOTE — Progress Notes (Signed)
Pt here for patient teaching.  Pt given Radiation and You booklet.  Reviewed areas of pertinence such as diarrhea, fatigue, hair loss, sexual and fertility changes, skin changes and urinary and bladder changes . Pt able to give teach back of to pat skin, use unscented/gentle soap, use baby wipes, have Imodium on hand, drink plenty of water and sitz bath,avoid applying anything to skin within 4 hours of treatment. Pt verbalizes understanding of information given and will contact nursing with any questions or concerns.     LaToya M. Silva RN, BSN     

## 2019-12-29 ENCOUNTER — Ambulatory Visit
Admission: RE | Admit: 2019-12-29 | Discharge: 2019-12-29 | Disposition: A | Discharge status: Home / Self Care | Payer: BC Managed Care – PPO | Source: Ambulatory Visit | Attending: Radiation Oncology | Admitting: Radiation Oncology

## 2019-12-29 ENCOUNTER — Other Ambulatory Visit: Payer: Self-pay

## 2019-12-29 DIAGNOSIS — C20 Malignant neoplasm of rectum: Secondary | ICD-10-CM | POA: Diagnosis not present

## 2020-01-01 ENCOUNTER — Ambulatory Visit
Admission: RE | Admit: 2020-01-01 | Discharge: 2020-01-01 | Disposition: A | Discharge status: Home / Self Care | Payer: BC Managed Care – PPO | Source: Ambulatory Visit | Attending: Radiation Oncology | Admitting: Radiation Oncology

## 2020-01-01 ENCOUNTER — Other Ambulatory Visit: Payer: Self-pay

## 2020-01-01 DIAGNOSIS — C20 Malignant neoplasm of rectum: Secondary | ICD-10-CM | POA: Diagnosis not present

## 2020-01-02 ENCOUNTER — Ambulatory Visit
Admission: RE | Admit: 2020-01-02 | Discharge: 2020-01-02 | Disposition: A | Discharge status: Home / Self Care | Payer: BC Managed Care – PPO | Source: Ambulatory Visit | Attending: Radiation Oncology | Admitting: Radiation Oncology

## 2020-01-02 ENCOUNTER — Other Ambulatory Visit: Payer: Self-pay

## 2020-01-02 DIAGNOSIS — C20 Malignant neoplasm of rectum: Secondary | ICD-10-CM | POA: Diagnosis not present

## 2020-01-03 ENCOUNTER — Ambulatory Visit
Admission: RE | Admit: 2020-01-03 | Discharge: 2020-01-03 | Disposition: A | Discharge status: Home / Self Care | Payer: BC Managed Care – PPO | Source: Ambulatory Visit | Attending: Radiation Oncology | Admitting: Radiation Oncology

## 2020-01-03 ENCOUNTER — Other Ambulatory Visit: Payer: Self-pay

## 2020-01-03 DIAGNOSIS — C20 Malignant neoplasm of rectum: Secondary | ICD-10-CM | POA: Diagnosis not present

## 2020-01-04 ENCOUNTER — Ambulatory Visit
Admission: RE | Admit: 2020-01-04 | Discharge: 2020-01-04 | Disposition: A | Discharge status: Home / Self Care | Payer: BC Managed Care – PPO | Source: Ambulatory Visit | Attending: Radiation Oncology | Admitting: Radiation Oncology

## 2020-01-04 ENCOUNTER — Other Ambulatory Visit: Payer: Self-pay

## 2020-01-04 DIAGNOSIS — C20 Malignant neoplasm of rectum: Secondary | ICD-10-CM | POA: Diagnosis not present

## 2020-01-05 ENCOUNTER — Other Ambulatory Visit: Payer: Self-pay

## 2020-01-05 ENCOUNTER — Ambulatory Visit
Admission: RE | Admit: 2020-01-05 | Discharge: 2020-01-05 | Disposition: A | Discharge status: Home / Self Care | Payer: BC Managed Care – PPO | Source: Ambulatory Visit | Attending: Radiation Oncology | Admitting: Radiation Oncology

## 2020-01-05 DIAGNOSIS — C20 Malignant neoplasm of rectum: Secondary | ICD-10-CM | POA: Diagnosis not present

## 2020-01-08 ENCOUNTER — Other Ambulatory Visit: Payer: Self-pay

## 2020-01-08 ENCOUNTER — Ambulatory Visit
Admission: RE | Admit: 2020-01-08 | Discharge: 2020-01-08 | Disposition: A | Discharge status: Home / Self Care | Payer: BC Managed Care – PPO | Source: Ambulatory Visit | Attending: Radiation Oncology | Admitting: Radiation Oncology

## 2020-01-08 DIAGNOSIS — C20 Malignant neoplasm of rectum: Secondary | ICD-10-CM | POA: Diagnosis not present

## 2020-01-09 ENCOUNTER — Other Ambulatory Visit: Payer: Self-pay

## 2020-01-09 ENCOUNTER — Ambulatory Visit
Admission: RE | Admit: 2020-01-09 | Discharge: 2020-01-09 | Disposition: A | Discharge status: Home / Self Care | Payer: BC Managed Care – PPO | Source: Ambulatory Visit | Attending: Radiation Oncology | Admitting: Radiation Oncology

## 2020-01-09 DIAGNOSIS — C20 Malignant neoplasm of rectum: Secondary | ICD-10-CM | POA: Diagnosis not present

## 2020-01-10 ENCOUNTER — Other Ambulatory Visit: Payer: Self-pay

## 2020-01-10 ENCOUNTER — Ambulatory Visit
Admission: RE | Admit: 2020-01-10 | Discharge: 2020-01-10 | Disposition: A | Discharge status: Home / Self Care | Payer: BC Managed Care – PPO | Source: Ambulatory Visit | Attending: Radiation Oncology | Admitting: Radiation Oncology

## 2020-01-10 DIAGNOSIS — C20 Malignant neoplasm of rectum: Secondary | ICD-10-CM | POA: Diagnosis not present

## 2020-01-10 MED FILL — CAPECITABINE 500 MG TABLET: 500 | 20 days supply | Qty: 98 | Fill #1

## 2020-01-11 ENCOUNTER — Ambulatory Visit
Admission: RE | Admit: 2020-01-11 | Discharge: 2020-01-11 | Disposition: A | Discharge status: Home / Self Care | Payer: BC Managed Care – PPO | Source: Ambulatory Visit | Attending: Radiation Oncology | Admitting: Radiation Oncology

## 2020-01-11 ENCOUNTER — Other Ambulatory Visit: Payer: Self-pay

## 2020-01-11 DIAGNOSIS — C20 Malignant neoplasm of rectum: Secondary | ICD-10-CM | POA: Diagnosis not present

## 2020-01-12 ENCOUNTER — Ambulatory Visit
Admission: RE | Admit: 2020-01-12 | Discharge: 2020-01-12 | Disposition: A | Discharge status: Home / Self Care | Payer: BC Managed Care – PPO | Source: Ambulatory Visit | Attending: Radiation Oncology | Admitting: Radiation Oncology

## 2020-01-12 ENCOUNTER — Other Ambulatory Visit: Payer: Self-pay

## 2020-01-12 DIAGNOSIS — C20 Malignant neoplasm of rectum: Secondary | ICD-10-CM | POA: Diagnosis not present

## 2020-01-15 ENCOUNTER — Ambulatory Visit
Admission: RE | Admit: 2020-01-15 | Discharge: 2020-01-15 | Disposition: A | Discharge status: Home / Self Care | Payer: BC Managed Care – PPO | Source: Ambulatory Visit | Attending: Radiation Oncology | Admitting: Radiation Oncology

## 2020-01-15 ENCOUNTER — Other Ambulatory Visit: Payer: Self-pay

## 2020-01-15 DIAGNOSIS — C20 Malignant neoplasm of rectum: Secondary | ICD-10-CM | POA: Diagnosis not present

## 2020-01-16 ENCOUNTER — Other Ambulatory Visit: Payer: Self-pay

## 2020-01-16 ENCOUNTER — Ambulatory Visit
Admission: RE | Admit: 2020-01-16 | Discharge: 2020-01-16 | Disposition: A | Discharge status: Home / Self Care | Payer: BC Managed Care – PPO | Source: Ambulatory Visit | Attending: Radiation Oncology | Admitting: Radiation Oncology

## 2020-01-16 DIAGNOSIS — C20 Malignant neoplasm of rectum: Secondary | ICD-10-CM | POA: Diagnosis not present

## 2020-01-17 ENCOUNTER — Other Ambulatory Visit: Payer: Self-pay

## 2020-01-17 ENCOUNTER — Ambulatory Visit
Admission: RE | Admit: 2020-01-17 | Discharge: 2020-01-17 | Disposition: A | Discharge status: Home / Self Care | Payer: BC Managed Care – PPO | Source: Ambulatory Visit | Attending: Radiation Oncology | Admitting: Radiation Oncology

## 2020-01-17 DIAGNOSIS — C20 Malignant neoplasm of rectum: Secondary | ICD-10-CM | POA: Diagnosis not present

## 2020-01-18 ENCOUNTER — Ambulatory Visit
Admission: RE | Admit: 2020-01-18 | Discharge: 2020-01-18 | Disposition: A | Discharge status: Home / Self Care | Payer: BC Managed Care – PPO | Source: Ambulatory Visit | Attending: Radiation Oncology | Admitting: Radiation Oncology

## 2020-01-18 ENCOUNTER — Other Ambulatory Visit: Payer: Self-pay

## 2020-01-18 DIAGNOSIS — C20 Malignant neoplasm of rectum: Secondary | ICD-10-CM | POA: Insufficient documentation

## 2020-01-19 ENCOUNTER — Ambulatory Visit
Admission: RE | Admit: 2020-01-19 | Discharge: 2020-01-19 | Disposition: A | Discharge status: Home / Self Care | Payer: BC Managed Care – PPO | Source: Ambulatory Visit | Attending: Radiation Oncology | Admitting: Radiation Oncology

## 2020-01-19 ENCOUNTER — Other Ambulatory Visit: Payer: Self-pay

## 2020-01-19 DIAGNOSIS — C20 Malignant neoplasm of rectum: Secondary | ICD-10-CM | POA: Diagnosis not present

## 2020-01-23 ENCOUNTER — Ambulatory Visit
Admission: RE | Admit: 2020-01-23 | Discharge: 2020-01-23 | Disposition: A | Discharge status: Home / Self Care | Payer: BC Managed Care – PPO | Source: Ambulatory Visit | Attending: Radiation Oncology | Admitting: Radiation Oncology

## 2020-01-23 DIAGNOSIS — C20 Malignant neoplasm of rectum: Secondary | ICD-10-CM | POA: Diagnosis not present

## 2020-01-23 MED FILL — BIKTARVY 50-200-25 MG TABS: 50-200-25 | 30 days supply | Qty: 30 | Fill #1

## 2020-01-24 ENCOUNTER — Ambulatory Visit
Admission: RE | Admit: 2020-01-24 | Discharge: 2020-01-24 | Disposition: A | Discharge status: Home / Self Care | Payer: BC Managed Care – PPO | Source: Ambulatory Visit | Attending: Radiation Oncology | Admitting: Radiation Oncology

## 2020-01-24 ENCOUNTER — Other Ambulatory Visit: Payer: Self-pay

## 2020-01-24 DIAGNOSIS — C20 Malignant neoplasm of rectum: Secondary | ICD-10-CM | POA: Diagnosis not present

## 2020-01-25 ENCOUNTER — Ambulatory Visit
Admission: RE | Admit: 2020-01-25 | Discharge: 2020-01-25 | Disposition: A | Discharge status: Home / Self Care | Payer: BC Managed Care – PPO | Source: Ambulatory Visit | Attending: Radiation Oncology | Admitting: Radiation Oncology

## 2020-01-25 ENCOUNTER — Other Ambulatory Visit: Payer: Self-pay

## 2020-01-25 DIAGNOSIS — C20 Malignant neoplasm of rectum: Secondary | ICD-10-CM | POA: Diagnosis not present

## 2020-01-26 ENCOUNTER — Ambulatory Visit
Admission: RE | Admit: 2020-01-26 | Discharge: 2020-01-26 | Disposition: A | Discharge status: Home / Self Care | Payer: BC Managed Care – PPO | Source: Ambulatory Visit | Attending: Radiation Oncology | Admitting: Radiation Oncology

## 2020-01-26 ENCOUNTER — Other Ambulatory Visit: Payer: Self-pay

## 2020-01-26 DIAGNOSIS — C20 Malignant neoplasm of rectum: Secondary | ICD-10-CM | POA: Diagnosis not present

## 2020-01-29 ENCOUNTER — Other Ambulatory Visit: Payer: Self-pay

## 2020-01-29 ENCOUNTER — Ambulatory Visit
Admission: RE | Admit: 2020-01-29 | Discharge: 2020-01-29 | Disposition: A | Discharge status: Home / Self Care | Payer: BC Managed Care – PPO | Source: Ambulatory Visit | Attending: Radiation Oncology | Admitting: Radiation Oncology

## 2020-01-29 DIAGNOSIS — C20 Malignant neoplasm of rectum: Secondary | ICD-10-CM | POA: Diagnosis not present

## 2020-01-30 ENCOUNTER — Ambulatory Visit
Admission: RE | Admit: 2020-01-30 | Discharge: 2020-01-30 | Disposition: A | Discharge status: Home / Self Care | Payer: BC Managed Care – PPO | Source: Ambulatory Visit | Attending: Radiation Oncology | Admitting: Radiation Oncology

## 2020-01-30 ENCOUNTER — Other Ambulatory Visit: Payer: Self-pay

## 2020-01-30 DIAGNOSIS — C20 Malignant neoplasm of rectum: Secondary | ICD-10-CM | POA: Diagnosis not present

## 2020-01-31 ENCOUNTER — Other Ambulatory Visit: Payer: Self-pay

## 2020-01-31 ENCOUNTER — Ambulatory Visit
Admission: RE | Admit: 2020-01-31 | Discharge: 2020-01-31 | Disposition: A | Discharge status: Home / Self Care | Payer: BC Managed Care – PPO | Source: Ambulatory Visit | Attending: Radiation Oncology | Admitting: Radiation Oncology

## 2020-01-31 DIAGNOSIS — C20 Malignant neoplasm of rectum: Secondary | ICD-10-CM | POA: Diagnosis not present

## 2020-02-01 ENCOUNTER — Ambulatory Visit
Admission: RE | Admit: 2020-02-01 | Discharge: 2020-02-01 | Disposition: A | Discharge status: Home / Self Care | Payer: BC Managed Care – PPO | Source: Ambulatory Visit | Attending: Radiation Oncology | Admitting: Radiation Oncology

## 2020-02-01 ENCOUNTER — Encounter: Payer: Self-pay | Admitting: Radiation Oncology

## 2020-02-01 ENCOUNTER — Other Ambulatory Visit: Payer: Self-pay

## 2020-02-01 DIAGNOSIS — C20 Malignant neoplasm of rectum: Secondary | ICD-10-CM | POA: Diagnosis not present

## 2020-02-02 DIAGNOSIS — I10 Essential (primary) hypertension: Secondary | ICD-10-CM | POA: Diagnosis not present

## 2020-02-02 DIAGNOSIS — E782 Mixed hyperlipidemia: Secondary | ICD-10-CM | POA: Diagnosis not present

## 2020-02-02 DIAGNOSIS — R7301 Impaired fasting glucose: Secondary | ICD-10-CM | POA: Diagnosis not present

## 2020-02-02 DIAGNOSIS — R748 Abnormal levels of other serum enzymes: Secondary | ICD-10-CM | POA: Diagnosis not present

## 2020-02-05 ENCOUNTER — Ambulatory Visit: Payer: Self-pay | Admitting: General Surgery

## 2020-02-05 DIAGNOSIS — C20 Malignant neoplasm of rectum: Secondary | ICD-10-CM | POA: Diagnosis not present

## 2020-02-05 NOTE — H&P (Signed)
The patient is a 49 year old male who presents with colorectal cancer. 49 year old male who presented to my office with a new diagnosis of rectal cancer. This was seen on screening colonoscopy. Patient has noticed rectal bleeding for the past 2 years. He denied any weight loss or changes in bowel habits. Colonoscopy revealed multiple polyps throughout the colon and a lesion in the distal rectum. Biopsies confirm adenocarcinoma within the rectal lesion. I performed independent interpretation of the CT scan and MRI. CT scans show no signs of metastatic disease. MRI showed a distal rectal lesion which appearred to be abutting the internal sphincter complex. CEA is 2.5. He has underwent total neoadjuvant chemoradiation treatment. He completed his final dose of radiation on July 15. He is now here to discuss surgical resection.   Problem List/Past Medical Leighton Ruff, MD; 6/76/7209 2:37 PM) RECTAL CANCER (C20)   Past Surgical History Leighton Ruff, MD; 4/70/9628 2:37 PM) Colon Polyp Removal - Colonoscopy  Shoulder Surgery  Left.  Diagnostic Studies History Leighton Ruff, MD; 3/66/2947 2:37 PM) Colonoscopy  within last year  Allergies Emeline Gins, [place]; 02/05/2020 2:01 PM) No Known Drug Allergies  [08/15/2019]: Allergies Reconciled   Medication History Leighton Ruff, MD; 6/54/6503 2:37 PM) Phillips Odor (50-200-25MG  Tablet, Oral) Active. amLODIPine Besy-Benazepril HCl (10-20MG  Capsule, Oral) Active. Atorvastatin Calcium (10MG  Tablet, Oral) Active. Medications Reconciled Neomycin Sulfate (500MG  Tablet, 2 (two) Oral SEE NOTE, Taken starting 02/05/2020) Active. (TAKE TWO TABLETS AT 2 PM, 3 PM, AND 10 PM THE DAY PRIOR TO SURGERY) Flagyl (500MG  Tablet, 2 (two) Oral SEE NOTE, Taken starting 02/05/2020) Active. (Take at 2pm, 3pm, and 10pm the day prior to your colon operation)  Social History Leighton Ruff, MD; 5/46/5681 2:37 PM) Alcohol use  Moderate alcohol  use. Caffeine use  Coffee. No drug use  Tobacco use  Former smoker.  Family History Leighton Ruff, MD; 2/75/1700 2:37 PM) Breast Cancer  Mother. Heart Disease  Father.  Other Problems Leighton Ruff, MD; 1/74/9449 2:37 PM) High blood pressure  HIV-positive  Hypercholesterolemia     Review of Systems Leighton Ruff MD; 6/75/9163 2:37 PM) General Present- Fatigue and Weight Gain. Not Present- Appetite Loss, Chills, Fever, Night Sweats and Weight Loss. Skin Present- Dryness. Not Present- Change in Wart/Mole, Hives, Jaundice, New Lesions, Non-Healing Wounds, Rash and Ulcer. HEENT Present- Seasonal Allergies. Not Present- Earache, Hearing Loss, Hoarseness, Nose Bleed, Oral Ulcers, Ringing in the Ears, Sinus Pain, Sore Throat, Visual Disturbances, Wears glasses/contact lenses and Yellow Eyes. Respiratory Not Present- Bloody sputum, Chronic Cough, Difficulty Breathing, Snoring and Wheezing. Breast Not Present- Breast Mass, Breast Pain, Nipple Discharge and Skin Changes. Cardiovascular Not Present- Chest Pain, Difficulty Breathing Lying Down, Leg Cramps, Palpitations, Rapid Heart Rate, Shortness of Breath and Swelling of Extremities. Gastrointestinal Present- Bloody Stool, Change in Bowel Habits and Chronic diarrhea. Not Present- Abdominal Pain, Bloating, Constipation, Difficulty Swallowing, Excessive gas, Gets full quickly at meals, Hemorrhoids, Indigestion, Nausea, Rectal Pain and Vomiting. Male Genitourinary Not Present- Blood in Urine, Change in Urinary Stream, Frequency, Impotence, Nocturia, Painful Urination, Urgency and Urine Leakage. Musculoskeletal Present- Back Pain. Not Present- Joint Pain, Joint Stiffness, Muscle Pain, Muscle Weakness and Swelling of Extremities. Neurological Not Present- Decreased Memory, Fainting, Headaches, Numbness, Seizures, Tingling, Tremor, Trouble walking and Weakness. Psychiatric Not Present- Anxiety, Bipolar, Change in Sleep Pattern, Depression,  Fearful and Frequent crying. Endocrine Not Present- Cold Intolerance, Excessive Hunger, Hair Changes, Heat Intolerance and New Diabetes. Hematology Present- HIV. Not Present- Blood Thinners, Easy Bruising, Excessive bleeding, Gland problems and Persistent  Infections.  Vitals Emeline Gins CMA; 02/05/2020 2:00 PM) 02/05/2020 2:00 PM Weight: 208.8 lb Height: 70in Body Surface Area: 2.13 m Body Mass Index: 29.96 kg/m  Temp.: 98.46F  Pulse: 85 (Regular)  BP: 170/102(Sitting, Left Arm, Standard)       Physical Exam Leighton Ruff MD; 02/01/9677 2:35 PM) General Mental Status-Alert. General Appearance-Cooperative.  Abdomen Palpation/Percussion Palpation and Percussion of the abdomen reveal - Soft and Non Tender.  Rectal Anorectal Exam External - normal external exam. Internal - normal sphincter tone. Note: Small nodule noted at anterior midline approximately 4 cm from anal verge.    Assessment & Plan Leighton Ruff MD; 9/38/1017 2:34 PM) RECTAL CANCER (C20) Impression: 49 year old male who presented to the office with a distal rectal cancer. MRI showed a stage III disease with an anterior tumor abutting the prostate. We discussed that surgical resection will be extremely difficult given the location of the tumor. He has completed total neoadjuvant chemotherapy and radiation and has had significant improvement in the size of the tumor,. It does appear that he will have enough margin to perform a coloanal anastomosis. We discussed that this would require a temporary ileostomy. The surgery and anatomy were described to the patient as well as the risks of surgery and the possible complications. These include: Bleeding, deep abdominal infections and possible wound complications such as hernia and infection, damage to adjacent structures, leak of surgical connections, which can lead to other surgeries and possibly an ostomy, possible need for other procedures, such as abscess  drains in radiology, possible prolonged hospital stay, possible diarrhea from removal of part of the colon, possible constipation from narcotics, possible bowel, bladder or sexual dysfunction if having rectal surgery, prolonged fatigue/weakness or appetite loss, possible early recurrence of of disease, possible complications of their medical problems such as heart disease or arrhythmias or lung problems, death (less than 1%). I believe the patient understands and wishes to proceed with the surgery.

## 2020-02-20 ENCOUNTER — Encounter: Payer: Self-pay | Admitting: Hematology

## 2020-02-20 NOTE — Telephone Encounter (Signed)
Mr Jen Mow returned my call.  He is out of town this week but is able to come in next week.  I sent a scheduling message.

## 2020-02-21 ENCOUNTER — Telehealth: Payer: Self-pay | Admitting: Hematology

## 2020-02-21 NOTE — Progress Notes (Signed)
[place]   [phone] [phone]   Clinic Follow up Note   Patient Care Team: Joshua Zhang as PCP - General (Physician Assistant) Joshua Bickers, MD as Consulting Physician (Infectious Diseases) Joshua Ruff, MD as Consulting Physician (General Surgery) Joshua Merle, MD as Consulting Physician (Hematology) Joshua Rudd, MD as Consulting Physician (Radiation Oncology) Joshua Finner, RN as Oncology Nurse Navigator  Date of Service:  02/26/2020  CHIEF COMPLAINT: F/u of rectal cancer  SUMMARY OF ONCOLOGIC HISTORY: Oncology History Overview Note  Cancer Staging Rectal adenocarcinoma [facility]) Staging form: Colon and Rectum, AJCC 8th Edition - Clinical stage from 08/21/2019: Stage IIIB (cT3, cN1, cM0) - Signed by Joshua Merle, MD on 08/21/2019    Rectal adenocarcinoma ([place])  07/03/2019 Procedure   Colonoscopy by Joshua Zhang  IMPRESSION -Likely Malignant 5cm, circumferential, bleeding tumor in the rectum, 304 cm from the anal verge -biopsy done  -One 8 mm sessile polyp in the distal sigmoid colon, removed with a hot snare x2; resected and retrieved.  -Two small sessile polyps, 1 in the mid-descending colon and 1 in the mid transverse colon - removed by cold biopsies.  -One 82m sessile polyp in the mid transverse colon, removed with a hot snare x1; resected and retrieved.  -One 875msessile polyp in the proximal ascending colon, removed with a hot snare X1; resected and retrieved.  -few large scattered diverticula.   07/03/2019 Initial Biopsy   FINAL DIAGNOSIS 07/03/19  A. Colon, Descending, Polyp, Polypectomy:   -TUBULAR ADENOMA   -No high grade dysplasia or malignancy.  B. Colon, transverse, polyp, polypectomy:   -TUBULAR ADENOMA  -No high grade dysplasia or malignancy. C. Colon, Ascending, Polyp, Polypectomy:   -Fragments of sessile serrated adenoma/polyp D. Colon, sigmoid, polyp, polypectomy:   -Fragments of Tubular Adenoma  -No  high grade dysplasia or malignancy. E. Rectum, Mass, Biopsy:   -INVASIVE COLONIC ADENOCARCINOMA, MODERATELY-DIFFERENTIATED, see comment     07/12/2019 Imaging   CT AP W Contrast 07/12/19  IMPRESSION: 1. No evidence of metastatic disease within the chest, abdomen or pelvis. 2. Possible mild wall thickening in the sigmoid colon, although no focal colonic mass is identified. There are few distal colonic diverticula. 3. Sequela of prior granulomatous disease. 4. Possible cholelithiasis with mild wall thickening of the gallbladder fundus. 5. Aortic Atherosclerosis (ICD10-I70.0).   08/21/2019 Initial Diagnosis   Rectal adenocarcinoma ([facility] [place]  08/21/2019 Cancer Staging   Staging form: Colon and Rectum, AJCC 8th Edition - Clinical stage from 08/21/2019: Stage IIIB (cT3, cN1, cM0) - Signed by FeTruitt MerleMD on 08/21/2019   08/31/2019 - 12/07/2019 Chemotherapy   FOLFOX q2weeks starting 08/31/19. Removed 5FU bolus and increased steroids pre-meds due to thrombocytopenia and skin rash starting with C4. Completed 12/07/19    11/21/2019 Imaging   CT AP W contrast  IMPRESSION: 1. Stable exam. No evidence for metastatic disease within the abdomen or pelvis. 2. Splenomegaly.  New from previous exam. 3. Gallstones.   Aortic Atherosclerosis (ICD10-I70.0).   12/25/2019 - 02/01/2020 Chemotherapy   Concurrent chemoRT with Xeloda '2000mg'$  in the AM and '1500mg'$  in the PM on days of RT  12/25/19-7/15/2   12/25/2019 - 02/01/2020 Radiation Therapy   Concurrent  chemoRT with Joshua Zhang/7/21-7/15/21      CURRENT THERAPY:  PENDING Surgery    INTERVAL HISTORY:  [name] here for a follow up. He presents to the clinic alone. He notes he took Xeloda through Radiation with out follow up. He  notes he tolerated Xeloda well. He completed last pill on last day of RT. He also tolerated RT. He denies rectal pain or skin toxicity. He denies residual fatigue. He notes he still has numbness of his fingers and toes from  IV chemo. This is stable. He notes stable balance and no changes in activities and not impacting his sleep. He feels he is overall recovering well. He feels prepared to proceed with surgery on 04/03/20.     REVIEW OF SYSTEMS:   Constitutional: Denies fevers, chills or abnormal weight loss Eyes: Denies blurriness of vision Ears, nose, mouth, throat, and face: Denies mucositis or sore throat Respiratory: Denies cough, dyspnea or wheezes Cardiovascular: Denies palpitation, chest discomfort or lower extremity swelling Gastrointestinal:  Denies nausea, heartburn or change in bowel habits Skin: Denies abnormal skin rashes Lymphatics: Denies new lymphadenopathy or easy bruising Neurological: (+) Stable numbness of fingertips and toes.  Behavioral/Psych: Mood is stable, no new changes  All other systems were reviewed with the patient and are negative.  MEDICAL HISTORY:  Past Medical History:  Diagnosis Date  . Hypertension   . rectal ca dx'd 06/2019    SURGICAL HISTORY: Past Surgical History:  Procedure Laterality Date  . APPENDECTOMY     49 years old   . IR IMAGING GUIDED PORT INSERTION  08/30/2019  . SHOULDER SURGERY Bilateral 49 years old    I have reviewed the social history and family history with the patient and they are unchanged from previous note.  ALLERGIES:  has No Known Allergies.  MEDICATIONS:  Current Outpatient Medications  Medication Sig Dispense Refill  . amLODipine (NORVASC) 5 MG tablet Take by mouth.    Marland Kitchen atorvastatin (LIPITOR) 10 MG tablet Take by mouth.    Marland Kitchen BIKTARVY 50-200-25 MG TABS tablet TAKE 1 TABLET BY MOUTH ONCE DAILY 30 tablet 5  . lidocaine-prilocaine (EMLA) cream Apply to affected area once (Patient not taking: Reported on 12/19/2019) 30 g 3  . losartan (COZAAR) 25 MG tablet Take by mouth.    . Multiple Vitamin (MULTIVITAMIN) tablet Take 1 tablet by mouth daily.    . ondansetron (ZOFRAN) 8 MG tablet Take 1 tablet (8 mg total) by mouth 2 (two) times  daily as needed for refractory nausea / vomiting. Start on day 3 after chemotherapy. (Patient not taking: Reported on 12/19/2019) 30 tablet 1  . prochlorperazine (COMPAZINE) 10 MG tablet Take 1 tablet (10 mg total) by mouth every 6 (six) hours as needed (Nausea or vomiting). (Patient not taking: Reported on 12/19/2019) 30 tablet 1   No current facility-administered medications for this visit.    PHYSICAL EXAMINATION: ECOG PERFORMANCE STATUS: 0 - Asymptomatic  Vitals:   02/26/20 1256 02/26/20 1258  BP: (!) 161/88 (!) 155/91  Pulse: 72   Resp: 17   Temp: 98.2 F (36.8 C)   SpO2: 100%    Filed Weights   02/26/20 1256  Weight: 210 lb 8 oz (95.5 kg)    GENERAL:alert, no distress and comfortable SKIN: skin color, texture, turgor are normal, no rashes or significant lesions EYES: normal, Conjunctiva are pink and non-injected, sclera clear  NECK: supple, thyroid normal size, non-tender, without nodularity LYMPH:  no palpable lymphadenopathy in the cervical, axillary  LUNGS: clear to auscultation and percussion with normal breathing effort HEART: regular rate & rhythm and no murmurs and no lower extremity edema ABDOMEN:abdomen soft, non-tender and normal bowel sounds Musculoskeletal:no cyanosis of digits and no clubbing  NEURO: alert & oriented x 3 with fluent speech,  no focal motor/sensory deficits  LABORATORY DATA:  I have reviewed the data as listed CBC Latest Ref Rng & Units 02/26/2020 12/07/2019 11/23/2019  WBC 4.0 - 10.5 K/uL 3.7(L) 8.4 7.4  Hemoglobin 13.0 - 17.0 g/dL 12.9(L) 13.9 13.5  Hematocrit 39 - 52 % 34.7(L) 39.2 38.7(L)  Platelets 150 - 400 K/uL 107(L) 96(L) 105(L)     CMP Latest Ref Rng & Units 02/26/2020 12/07/2019 11/23/2019  Glucose 70 - 99 mg/dL 95 150(H) 135(H)  BUN 6 - 20 mg/dL '19 14 9  '$ Creatinine 0.61 - 1.24 mg/dL 0.94 0.91 0.87  Sodium 135 - 145 mmol/L 140 140 143  Potassium 3.5 - 5.1 mmol/L 4.0 4.0 3.9  Chloride 98 - 111 mmol/L 108 108 109  CO2 22 - 32 mmol/L 22  21(L) 22  Calcium 8.9 - 10.3 mg/dL 9.6 8.9 8.8(L)  Total Protein 6.5 - 8.1 g/dL 7.3 6.9 6.7  Total Bilirubin 0.3 - 1.2 mg/dL 1.6(H) 0.8 0.7  Alkaline Phos 38 - 126 U/L 149(H) 232(H) 203(H)  AST 15 - 41 U/L 63(H) 64(H) 59(H)  ALT 0 - 44 U/L 73(H) 60(H) 49(H)      RADIOGRAPHIC STUDIES: I have personally reviewed the radiological images as listed and agreed with the findings in the report. No results found.   ASSESSMENT & PLAN:  Joshua Zhang is a 49 y.o. male with    1.Low rectaladenocarcinoma,cT3N1M0,Stage IIIB, MMR normal -He presented with intermittent rectal bleeding for 2 years, but otherwise asymptomatic, this was discovered on screening colonoscopyin 06/2019. -Colonoscopy and scan shows he has low rectum mass which is close to sphincter. Biopsy from screening colonoscopy showed this is moderately-differentiated adenocarcinoma locally advanced. Based on MRI, this is cT3N1 stage III disease.  -He completed 8 cycles of total neoadjuvant chemotherapywithFOLFOX2/11/21-5/20/21 and concurrent chemoRTwith Xeloda 12/25/19-02/01/20. He tolerated very well  -He lost follow up with me while on chemoRT but was able to complete treatment. He notes he tolerated well with no skin toxicity or rectal pain. -He only has residual numbness of fingers and toes from prior IV chemo.  -Physical exam unremarkable. Labs reviewed, CBC and CMP WNL except WBC 3.7, Hg 12.9, plt 107K and mild transaminitis and mild hyperbilirubinuria. I discussed his transaminits is likely related to chemoRT. He denies symptoms of jaundice.  -He plans to proceed with surgery with Joshua Marcello Moores on 04/03/20. I discussed adjuvant chemo is unlikley needed, unless he has significant residual disease.   -I discussed the risk of cancer recurrence in the future. I discussed the surveillance plan, which is a physical exam and lab test (including CBC, CMP and CEA) every 3 months for the first 2 years, then every 6-12 months,  colonoscopy in one year, and surveillance CT scan every 6-12 month for up to 5 year.  -f/u 4 weeks after his surgery    2. HIV, controlled -He denies h/o or current use of recreational or IV drugs. He is unsure how he initially got infected.  -Controlled and undetectable. 09/15/18 CD4 was 350.His3/22/21 CD4 T cells is 708 -He is on Biktavy. This is managed by Joshua. Megan Salon. -He has received both his COVID19 vaccines. I discussed given his HIV and being on chemo his immune response may not be as effective. I discussed if a booster vaccine becomes available he would be a good candidate.   3. HLD, HTN -He will continue medications and f/u with PCP -His BPcontinues to beelevated in clinic but per pt normal at home. I encouraged him to continue  to monitor and continue medications.He takes his medications in the afternoon.  4. Thrombocytopenia  -Secondary to chemo FOLFOX -Removed 5FU bolus starting with C4 and dose reduced with C8 -s/p neo-adjuvant treatment, Plt 107K today (02/26/20)  5. Mild Transaminitis -Labs today shows AST 63, ALT 73, Alk Phos 149, Tbili 1.6  -I discussed this is likely related to recent chemoRT, but he did not have lab during this time. He denies symptoms of jaundice. Will monitor.    PLAN: -Proceed with surgery on 04/03/20  -Lab and f/u one month after surgery    No problem-specific Assessment & Plan notes found for this encounter.   No orders of the defined types were placed in this encounter.  All questions were answered. The patient knows to call the clinic with any problems, questions or concerns. No barriers to learning was detected. The total time spent in the appointment was 25 minutes.     Joshua Merle, MD 02/26/2020   I, Joslyn Devon, am acting as scribe for Joshua Merle, MD.   I have reviewed the above documentation for accuracy and completeness, and I agree with the above.

## 2020-02-21 NOTE — Telephone Encounter (Signed)
Scheduled appt per 8/3 sch msg - left message for patient with appt date and time   

## 2020-02-24 NOTE — Progress Notes (Signed)
  Radiation Oncology         [phone] ________________________________  Name: Joshua Zhang MRN: [id number]  Date: 02/01/2020  DOB: 1970-09-11  End of Treatment Note  Diagnosis:   Rectal cancer    Cancer Staging Rectal adenocarcinoma [facility]) Staging form: Colon and Rectum, AJCC 8th Edition - Clinical stage from 08/21/2019: Stage IIIB (cT3, cN1, cM0) - Signed by Truitt Merle, MD on 08/21/2019   Indication for treatment:  Curative       Radiation treatment dates:   12/25/19 - 02/01/20  Site/dose:    The patient was treated to the pelvis to a dose of 45 Gy at 1.8 Gy per fraction. This was accomplished using a 4 field 3-D conformal technique. The patient then received a boost to the tumor and adjacent high-risk regions for an additional 5.4 Gy at 1.8 gray per fraction. This was carried out using a coned-down 4 field approach. The patient's total dose was 50.4 Gy. Daily AlignRT was used on a daily basis to insure proper patient positioning and localization of critical targets/ structures. The patient received concurrent chemotherapy during the course of radiation treatment.  Narrative: The patient tolerated radiation treatment relatively well.     Plan: The patient has completed radiation treatment. The patient will return to radiation oncology clinic for routine followup in one month. I advised the patient to call or return sooner if they have any questions or concerns related to their recovery or treatment.   ------------------------------------------------  Jodelle Gross, MD, PhD

## 2020-02-26 ENCOUNTER — Inpatient Hospital Stay: Payer: BC Managed Care – PPO | Attending: Hematology | Admitting: Hematology

## 2020-02-26 ENCOUNTER — Inpatient Hospital Stay: Payer: BC Managed Care – PPO

## 2020-02-26 ENCOUNTER — Encounter: Payer: Self-pay | Admitting: Hematology

## 2020-02-26 ENCOUNTER — Telehealth: Payer: Self-pay | Admitting: Radiation Oncology

## 2020-02-26 ENCOUNTER — Other Ambulatory Visit: Payer: Self-pay

## 2020-02-26 VITALS — BP 155/91 | HR 72 | Temp 98.2°F | Resp 17 | Ht >= 80 in | Wt 210.5 lb

## 2020-02-26 DIAGNOSIS — C20 Malignant neoplasm of rectum: Secondary | ICD-10-CM

## 2020-02-26 DIAGNOSIS — D124 Benign neoplasm of descending colon: Secondary | ICD-10-CM | POA: Insufficient documentation

## 2020-02-26 DIAGNOSIS — D125 Benign neoplasm of sigmoid colon: Secondary | ICD-10-CM | POA: Diagnosis not present

## 2020-02-26 DIAGNOSIS — R200 Anesthesia of skin: Secondary | ICD-10-CM | POA: Insufficient documentation

## 2020-02-26 DIAGNOSIS — D696 Thrombocytopenia, unspecified: Secondary | ICD-10-CM | POA: Insufficient documentation

## 2020-02-26 DIAGNOSIS — I700 Atherosclerosis of aorta: Secondary | ICD-10-CM | POA: Diagnosis not present

## 2020-02-26 DIAGNOSIS — D123 Benign neoplasm of transverse colon: Secondary | ICD-10-CM | POA: Insufficient documentation

## 2020-02-26 DIAGNOSIS — Z79899 Other long term (current) drug therapy: Secondary | ICD-10-CM | POA: Diagnosis not present

## 2020-02-26 DIAGNOSIS — Z9049 Acquired absence of other specified parts of digestive tract: Secondary | ICD-10-CM | POA: Diagnosis not present

## 2020-02-26 DIAGNOSIS — Z95828 Presence of other vascular implants and grafts: Secondary | ICD-10-CM

## 2020-02-26 DIAGNOSIS — R7401 Elevation of levels of liver transaminase levels: Secondary | ICD-10-CM | POA: Insufficient documentation

## 2020-02-26 DIAGNOSIS — I10 Essential (primary) hypertension: Secondary | ICD-10-CM | POA: Insufficient documentation

## 2020-02-26 DIAGNOSIS — D122 Benign neoplasm of ascending colon: Secondary | ICD-10-CM | POA: Diagnosis not present

## 2020-02-26 DIAGNOSIS — K8020 Calculus of gallbladder without cholecystitis without obstruction: Secondary | ICD-10-CM | POA: Insufficient documentation

## 2020-02-26 DIAGNOSIS — E785 Hyperlipidemia, unspecified: Secondary | ICD-10-CM | POA: Diagnosis not present

## 2020-02-26 DIAGNOSIS — R161 Splenomegaly, not elsewhere classified: Secondary | ICD-10-CM | POA: Diagnosis not present

## 2020-02-26 DIAGNOSIS — Z21 Asymptomatic human immunodeficiency virus [HIV] infection status: Secondary | ICD-10-CM | POA: Insufficient documentation

## 2020-02-26 LAB — CMP (CANCER CENTER ONLY)
ALT: 73 U/L — ABNORMAL HIGH (ref 0–44)
AST: 63 U/L — ABNORMAL HIGH (ref 15–41)
Albumin: 4.1 g/dL (ref 3.5–5.0)
Alkaline Phosphatase: 149 U/L — ABNORMAL HIGH (ref 38–126)
Anion gap: 10 (ref 5–15)
BUN: 19 mg/dL (ref 6–20)
CO2: 22 mmol/L (ref 22–32)
Calcium: 9.6 mg/dL (ref 8.9–10.3)
Chloride: 108 mmol/L (ref 98–111)
Creatinine: 0.94 mg/dL (ref 0.61–1.24)
GFR, Est AFR Am: >60 mL/min (ref >60)
GFR, Estimated: >60 mL/min (ref >60)
Glucose, Bld: 95 mg/dL (ref 70–99)
Potassium: 4 mmol/L (ref 3.5–5.1)
Sodium: 140 mmol/L (ref 135–145)
Total Bilirubin: 1.6 mg/dL — ABNORMAL HIGH (ref 0.3–1.2)
Total Protein: 7.3 g/dL (ref 6.5–8.1)

## 2020-02-26 LAB — CBC WITH DIFFERENTIAL (CANCER CENTER ONLY)
Abs Immature Granulocytes: 0.02 10*3/uL (ref 0.00–0.07)
Basophils Absolute: 0 10*3/uL (ref 0.0–0.1)
Basophils Relative: 1 %
Eosinophils Absolute: 0.1 10*3/uL (ref 0.0–0.5)
Eosinophils Relative: 3 %
HCT: 34.7 % — ABNORMAL LOW (ref 39.0–52.0)
Hemoglobin: 12.9 g/dL — ABNORMAL LOW (ref 13.0–17.0)
Immature Granulocytes: 1 %
Lymphocytes Relative: 17 %
Lymphs Abs: 0.6 10*3/uL — ABNORMAL LOW (ref 0.7–4.0)
MCH: 38.7 pg — ABNORMAL HIGH (ref 26.0–34.0)
MCHC: 37.2 g/dL — ABNORMAL HIGH (ref 30.0–36.0)
MCV: 104.2 fL — ABNORMAL HIGH (ref 80.0–100.0)
Monocytes Absolute: 0.4 10*3/uL (ref 0.1–1.0)
Monocytes Relative: 10 %
Neutro Abs: 2.5 10*3/uL (ref 1.7–7.7)
Neutrophils Relative %: 68 %
Platelet Count: 107 10*3/uL — ABNORMAL LOW (ref 150–400)
RBC: 3.33 MIL/uL — ABNORMAL LOW (ref 4.22–5.81)
RDW: 12.8 % (ref 11.5–15.5)
WBC Count: 3.7 10*3/uL — ABNORMAL LOW (ref 4.0–10.5)
nRBC: 0 % (ref 0.0–0.2)

## 2020-02-26 MED ORDER — SODIUM CHLORIDE 0.9% FLUSH
10.0000 mL | INTRAVENOUS | Status: DC | PRN
  Administered 2020-02-26: 10 mL
  Filled 2020-02-26: qty 10

## 2020-02-26 MED ORDER — HEPARIN SOD (PORK) LOCK FLUSH 100 UNIT/ML IV SOLN
500.0000 [IU] | Freq: Once | INTRAVENOUS | Status: AC | PRN
  Administered 2020-02-26: 500 [IU]
  Filled 2020-02-26: qty 5

## 2020-02-26 MED FILL — BIKTARVY 50-200-25 MG TABS: 50-200-25 | 30 days supply | Qty: 30 | Fill #2

## 2020-02-26 NOTE — Telephone Encounter (Signed)
  Radiation Oncology         [phone] ________________________________  Name: Joshua Zhang MRN: [id number]  Date of Service: 02/26/2020  DOB: July 17, 1971  Post Treatment Telephone Note  Diagnosis:  Stage IIIB, cT3N1M0 adenocarcinoma of the distal rectum.  Interval Since Last Radiation:  4 weeks   12/25/19 - 02/01/20:   The patient was treated to the pelvis to a dose of 45 Gy at 1.8 Gy per fraction. This was accomplished using a 4 field 3-D conformal technique. The patient then received a boost to the tumor and adjacent high-risk regions for an additional 5.4 Gy at 1.8 gray per fraction. This was carried out using a coned-down 4 field approach. The patient's total dose was 50.4 Gy. Daily AlignRT was used on a daily basis to insure proper patient positioning and localization of critical targets/ structures. The patient received concurrent chemotherapy during the course of radiation treatment.   Narrative:  The patient was contacted today for routine follow-up. During treatment he did very well with radiotherapy and did not have significant desquamation. The patient's wife answered my call today and reports he is doing pretty well. He is having some difficulty with peripheral neuropathy from his prior chemotherapy. He is scheduled for robotic LAR with Dr. Marcello Moores on 04/03/20.  Impression/Plan: 1. Stage IIIB, cT3N1M0 adenocarcinoma of the distal rectum. The patient has been doing well since completion of radiotherapy. We discussed that we would be happy to continue to follow him as needed, but he will also continue to follow up with Dr. Burr Medico in medical oncology and Dr. Marcello Moores in [place]. 2. Peripheral neuropathy. We discussed the nature of this as a result of chemotherapy and the possibility of this improving. I also suggested he start taking Vitamin B6 100 mg a day. This will be followed expectantly.    Carola Rhine, PAC

## 2020-03-15 ENCOUNTER — Encounter: Payer: Self-pay | Admitting: Hematology

## 2020-03-22 NOTE — Progress Notes (Addendum)
COVID Vaccine Completed:  x2 Date COVID Vaccine completed: 11-20-19 & 12-13-19  COVID vaccine manufacturer: [place]   PCP - Clyde Lundborg, PA-C Cardiologist -   Chest x-ray -  EKG - 03-28-20 Stress Test -  ECHO -  Cardiac Cath -   Sleep Study -  CPAP -   Fasting Blood Sugar -  Checks Blood Sugar _____ times a day  Blood Thinner Instructions: Aspirin Instructions: Last Dose:  Anesthesia review:   Patient denies shortness of breath, fever, cough and chest pain at PAT appointment   Patient verbalized understanding of instructions that were given to them at the PAT appointment. Patient was also instructed that they will need to review over the PAT instructions again at home before surgery.

## 2020-03-22 NOTE — Patient Instructions (Addendum)
DUE TO COVID-19 ONLY ONE VISITOR IS ALLOWED TO COME WITH YOU AND STAY IN THE WAITING ROOM ONLY DURING  PRE OP AND PROCEDURE.   IF YOU WILL BE ADMITTED INTO THE HOSPITAL YOU ARE ALLOWED ONE SUPPORT PERSON DURING VISITATION HOURS  ONLY (10AM -8PM)   . The support person may change daily. . The support person must pass our screening, gel in and out, and wear a mask at all times, including in the patient's room. . Patients must also wear a mask when staff or their support person are in the room.   COVID SWAB TESTING MUST BE COMPLETED ON:  Monday, 04-01-20 @ 8:15 AM    [address] [place], [place] [address]  (Must self quarantine after testing. Follow instructions on handout.)        Your procedure is scheduled on:  Thursday, 04-03-20   Report to [facility] Main  Entrance   Report to admitting at 6:30 AM     Call this number if you have problems the morning of surgery [phone]   Follow a clear liquid diet the day of prep to prevent dehydration   Do not eat food :After Midnight.   May have liquids until 5:30 AM day of surgery   CLEAR LIQUID DIET  Foods Allowed                                                                     Foods Excluded  Water, Black Coffee and tea, regular and decaf           liquids that you cannot  Plain Jell-O in any flavor  (No red)                                  see through such as: Fruit ices (not with fruit pulp)                                      milk, soups, orange juice              Iced Popsicles (No red)                                      All solid food                                   Apple juices Sports drinks like Gatorade (No red) Lightly seasoned clear broth or consume(fat free) Sugar, honey syrup     Drink 2 Ensure drinks the night before surgery.     Complete one Ensure drink the morning of surgery at 5:30 AM.    Oral Hygiene is also important to reduce your risk of infection.                                     Remember - BRUSH YOUR TEETH  THE MORNING OF SURGERY WITH YOUR REGULAR TOOTHPASTE   Do NOT smoke after Midnight   Take these medicines the morning of surgery with A SIP OF WATER: Amlodipine, Atorvastatin, Biktarvy                                You may not have any metal on your body including jewelry, and body piercings             Do not wear lotions, powders, perfumes/cologne, or deodorant             Men may shave face and neck.   Do not bring valuables to the hospital. [place].   Contacts, dentures or bridgework may not be worn into surgery.   Bring small overnight bag day of surgery.                   Please read over the following fact sheets you were given: IF YOU HAVE QUESTIONS ABOUT YOUR PRE OP INSTRUCTIONS  PLEASE CALL [phone]   [facility] - Preparing for Surgery Before surgery, you can play an important role.  Because skin is not sterile, your skin needs to be as free of germs as possible.  You can reduce the number of germs on your skin by washing with CHG (chlorahexidine gluconate) soap before surgery.  CHG is an antiseptic cleaner which kills germs and bonds with the skin to continue killing germs even after washing. Please DO NOT use if you have an allergy to CHG or antibacterial soaps.  If your skin becomes reddened/irritated stop using the CHG and inform your nurse when you arrive at Short Stay. Do not shave (including legs and underarms) for at least 48 hours prior to the first CHG shower.  You may shave your face/neck.  Please follow these instructions carefully:  1.  Shower with CHG Soap the night before surgery and the  morning of surgery.  2.  If you choose to wash your hair, wash your hair first as usual with your normal  shampoo.  3.  After you shampoo, rinse your hair and body thoroughly to remove the shampoo.                             4.  Use CHG as you would any other liquid soap.  You can apply chg directly  to the skin and wash.  Gently with a scrungie or clean washcloth.  5.  Apply the CHG Soap to your body ONLY FROM THE NECK DOWN.   Do   not use on face/ open                           Wound or open sores. Avoid contact with eyes, ears mouth and   genitals (private parts).                       Wash face,  Genitals (private parts) with your normal soap.             6.  Wash thoroughly, paying special attention to the area where your    surgery  will be performed.  7.  Thoroughly rinse your body with warm water from the neck down.  8.  DO NOT shower/wash with your  normal soap after using and rinsing off the CHG Soap.                9.  Pat yourself dry with a clean towel.            10.  Wear clean pajamas.            11.  Place clean sheets on your bed the night of your first shower and do not  sleep with pets. Day of Surgery : Do not apply any lotions/deodorants the morning of surgery.  Please wear clean clothes to the hospital/surgery center.  FAILURE TO FOLLOW THESE INSTRUCTIONS MAY RESULT IN THE CANCELLATION OF YOUR SURGERY  PATIENT SIGNATURE_________________________________  NURSE SIGNATURE__________________________________  ________________________________________________________________________   Joshua Zhang  An incentive spirometer is a tool that can help keep your lungs clear and active. This tool measures how well you are filling your lungs with each breath. Taking long deep breaths may help reverse or decrease the chance of developing breathing (pulmonary) problems (especially infection) following:  A long period of time when you are unable to move or be active. BEFORE THE PROCEDURE   If the spirometer includes an indicator to show your best effort, your nurse or respiratory therapist will set it to a desired goal.  If possible, sit up straight or lean slightly forward. Try not to slouch.  Hold the incentive spirometer in an upright position. INSTRUCTIONS FOR USE   1. Sit on the edge of your bed if possible, or sit up as far as you can in bed or on a chair. 2. Hold the incentive spirometer in an upright position. 3. Breathe out normally. 4. Place the mouthpiece in your mouth and seal your lips tightly around it. 5. Breathe in slowly and as deeply as possible, raising the piston or the ball toward the top of the column. 6. Hold your breath for 3-5 seconds or for as long as possible. Allow the piston or ball to fall to the bottom of the column. 7. Remove the mouthpiece from your mouth and breathe out normally. 8. Rest for a few seconds and repeat Steps 1 through 7 at least 10 times every 1-2 hours when you are awake. Take your time and take a few normal breaths between deep breaths. 9. The spirometer may include an indicator to show your best effort. Use the indicator as a goal to work toward during each repetition. 10. After each set of 10 deep breaths, practice coughing to be sure your lungs are clear. If you have an incision (the cut made at the time of surgery), support your incision when coughing by placing a pillow or rolled up towels firmly against it. Once you are able to get out of bed, walk around indoors and cough well. You may stop using the incentive spirometer when instructed by your caregiver.  RISKS AND COMPLICATIONS  Take your time so you do not get dizzy or light-headed.  If you are in pain, you may need to take or ask for pain medication before doing incentive spirometry. It is harder to take a deep breath if you are having pain. AFTER USE  Rest and breathe slowly and easily.  It can be helpful to keep track of a log of your progress. Your caregiver can provide you with a simple table to help with this. If you are using the spirometer at home, follow these instructions: [place] IF:   You are having difficultly using the spirometer.  You have trouble using the spirometer as often as instructed.  Your pain medication is  not giving enough relief while using the spirometer.  You develop fever of 100.5 F (38.1 C) or higher. SEEK IMMEDIATE MEDICAL CARE IF:   You cough up bloody sputum that had not been present before.  You develop fever of 102 F (38.9 C) or greater.  You develop worsening pain at or near the incision site. MAKE SURE YOU:   Understand these instructions.  Will watch your condition.  Will get help right away if you are not doing well or get worse. Document Released: 11/16/2006 Document Revised: 09/28/2011 Document Reviewed: 01/17/2007 Exit[employer] Patient Information 2014 [employer], [place].   ________________________________________________________________________  WHAT IS A BLOOD TRANSFUSION? Blood Transfusion Information  A transfusion is the replacement of blood or some of its parts. Blood is made up of multiple cells which provide different functions.  Red blood cells carry oxygen and are used for blood loss replacement.  White blood cells fight against infection.  Platelets control bleeding.  Plasma helps clot blood.  Other blood products are available for specialized needs, such as hemophilia or other clotting disorders. BEFORE THE TRANSFUSION  Who gives blood for transfusions?   Healthy volunteers who are fully evaluated to make sure their blood is safe. This is blood bank blood. Transfusion therapy is the safest it has ever been in the practice of medicine. Before blood is taken from a donor, a complete history is taken to make sure that person has no history of diseases nor engages in risky social behavior (examples are intravenous drug use or sexual activity with multiple partners). The donor's travel history is screened to minimize risk of transmitting infections, such as malaria. The donated blood is tested for signs of infectious diseases, such as HIV and hepatitis. The blood is then tested to be sure it is compatible with you in order to minimize the chance of a  transfusion reaction. If you or a relative donates blood, this is often done in anticipation of surgery and is not appropriate for emergency situations. It takes many days to process the donated blood. RISKS AND COMPLICATIONS Although transfusion therapy is very safe and saves many lives, the main dangers of transfusion include:   Getting an infectious disease.  Developing a transfusion reaction. This is an allergic reaction to something in the blood you were given. Every precaution is taken to prevent this. The decision to have a blood transfusion has been considered carefully by your caregiver before blood is given. Blood is not given unless the benefits outweigh the risks. AFTER THE TRANSFUSION  Right after receiving a blood transfusion, you will usually feel much better and more energetic. This is especially true if your red blood cells have gotten low (anemic). The transfusion raises the level of the red blood cells which carry oxygen, and this usually causes an energy increase.  The nurse administering the transfusion will monitor you carefully for complications. HOME CARE INSTRUCTIONS  No special instructions are needed after a transfusion. You may find your energy is better. Speak with your caregiver about any limitations on activity for underlying diseases you may have. SEEK MEDICAL CARE IF:   Your condition is not improving after your transfusion.  You develop redness or irritation at the intravenous (IV) site. SEEK IMMEDIATE MEDICAL CARE IF:  Any of the following symptoms occur over the next 12 hours:  Shaking chills.  You have a temperature by mouth above 102 F (38.9 C), not  controlled by medicine.  Chest, back, or muscle pain.  People around you feel you are not acting correctly or are confused.  Shortness of breath or difficulty breathing.  Dizziness and fainting.  You get a rash or develop hives.  You have a decrease in urine output.  Your urine turns a dark  color or changes to pink, red, or brown. Any of the following symptoms occur over the next 10 days:  You have a temperature by mouth above 102 F (38.9 C), not controlled by medicine.  Shortness of breath.  Weakness after normal activity.  The white part of the eye turns yellow (jaundice).  You have a decrease in the amount of urine or are urinating less often.  Your urine turns a dark color or changes to pink, red, or brown. Document Released: 07/03/2000 Document Revised: 09/28/2011 Document Reviewed: 02/20/2008 [facility] Patient Information 2014 [place], [place].  _______________________________________________________________________

## 2020-03-26 MED FILL — BIKTARVY 50-200-25 MG TABS: 50-200-25 | 30 days supply | Qty: 30 | Fill #3

## 2020-03-28 ENCOUNTER — Encounter (HOSPITAL_COMMUNITY)
Admission: RE | Admit: 2020-03-28 | Discharge: 2020-03-28 | Disposition: A | Discharge status: Home / Self Care | Payer: BC Managed Care – PPO | Source: Ambulatory Visit | Attending: General Surgery | Admitting: General Surgery

## 2020-03-28 ENCOUNTER — Other Ambulatory Visit: Payer: Self-pay

## 2020-03-28 ENCOUNTER — Encounter (HOSPITAL_COMMUNITY): Payer: Self-pay

## 2020-03-28 DIAGNOSIS — Z01818 Encounter for other preprocedural examination: Secondary | ICD-10-CM | POA: Diagnosis not present

## 2020-03-28 HISTORY — DX: Asymptomatic human immunodeficiency virus (hiv) infection status: Z21

## 2020-03-28 HISTORY — DX: Human immunodeficiency virus (HIV) disease: B20

## 2020-03-28 LAB — BASIC METABOLIC PANEL
Anion gap: 9 (ref 5–15)
BUN: 18 mg/dL (ref 6–20)
CO2: 22 mmol/L (ref 22–32)
Calcium: 9.1 mg/dL (ref 8.9–10.3)
Chloride: 107 mmol/L (ref 98–111)
Creatinine, Ser: 0.9 mg/dL (ref 0.61–1.24)
GFR calc Af Amer: >60 mL/min (ref >60)
GFR calc non Af Amer: >60 mL/min (ref >60)
Glucose, Bld: 114 mg/dL — ABNORMAL HIGH (ref 70–99)
Potassium: 4.1 mmol/L (ref 3.5–5.1)
Sodium: 138 mmol/L (ref 135–145)

## 2020-03-28 LAB — CBC
HCT: 36.8 % — ABNORMAL LOW (ref 39.0–52.0)
Hemoglobin: 13.5 g/dL (ref 13.0–17.0)
MCH: 37.7 pg — ABNORMAL HIGH (ref 26.0–34.0)
MCHC: 36.7 g/dL — ABNORMAL HIGH (ref 30.0–36.0)
MCV: 102.8 fL — ABNORMAL HIGH (ref 80.0–100.0)
Platelets: 148 10*3/uL — ABNORMAL LOW (ref 150–400)
RBC: 3.58 MIL/uL — ABNORMAL LOW (ref 4.22–5.81)
RDW: 11.7 % (ref 11.5–15.5)
WBC: 4 10*3/uL (ref 4.0–10.5)
nRBC: 0 % (ref 0.0–0.2)

## 2020-03-28 NOTE — Consult Note (Signed)
[facility] Nurse requested for preoperative stoma site marking by Dr. Joyice Faster.  Discussed surgical procedure and stoma creation with patient.  Explained role of the [facility] nurse team.  Answered patient questions.   Examined patient lsitting, leaning forward and standing in order to place the marking in the patient's visual field, away from any creases or abdominal contour issues and within the rectus muscle.  Patient is able to see both marks.   Marked for colostomy in the LLQ  8cm to the left of the umbilicus and 2.0SH below the umbilicus.  Marked for ileostomy in the RUQ  7cm to the right of the umbilicus and 1cm above the umbilicus.  Patient's abdomen cleansed with CHG wipes at site markings, allowed to air dry prior to marking.Covered mark with thin film transparent dressing to preserve mark until date of surgery.   [facility] Nurse team will follow up with patient after surgery for continue ostomy care and teaching.   DOS is Wednesday, April 03, 2020.  Thanks, Maudie Flakes, MSN, RN, [place], Arther Abbott  Pager# [phone]

## 2020-04-01 ENCOUNTER — Other Ambulatory Visit (HOSPITAL_COMMUNITY)
Admission: RE | Admit: 2020-04-01 | Discharge: 2020-04-01 | Disposition: A | Discharge status: Home / Self Care | Payer: BC Managed Care – PPO | Source: Ambulatory Visit | Attending: General Surgery | Admitting: General Surgery

## 2020-04-01 DIAGNOSIS — Z8249 Family history of ischemic heart disease and other diseases of the circulatory system: Secondary | ICD-10-CM | POA: Diagnosis not present

## 2020-04-01 DIAGNOSIS — Z923 Personal history of irradiation: Secondary | ICD-10-CM | POA: Diagnosis not present

## 2020-04-01 DIAGNOSIS — Z87891 Personal history of nicotine dependence: Secondary | ICD-10-CM | POA: Diagnosis not present

## 2020-04-01 DIAGNOSIS — I10 Essential (primary) hypertension: Secondary | ICD-10-CM | POA: Diagnosis not present

## 2020-04-01 DIAGNOSIS — E782 Mixed hyperlipidemia: Secondary | ICD-10-CM | POA: Diagnosis not present

## 2020-04-01 DIAGNOSIS — B20 Human immunodeficiency virus [HIV] disease: Secondary | ICD-10-CM | POA: Diagnosis not present

## 2020-04-01 DIAGNOSIS — Z20822 Contact with and (suspected) exposure to covid-19: Secondary | ICD-10-CM | POA: Diagnosis not present

## 2020-04-01 DIAGNOSIS — Z21 Asymptomatic human immunodeficiency virus [HIV] infection status: Secondary | ICD-10-CM | POA: Diagnosis not present

## 2020-04-01 DIAGNOSIS — Z9221 Personal history of antineoplastic chemotherapy: Secondary | ICD-10-CM | POA: Diagnosis not present

## 2020-04-01 DIAGNOSIS — E7800 Pure hypercholesterolemia, unspecified: Secondary | ICD-10-CM | POA: Diagnosis not present

## 2020-04-01 DIAGNOSIS — Z01812 Encounter for preprocedural laboratory examination: Secondary | ICD-10-CM | POA: Insufficient documentation

## 2020-04-01 DIAGNOSIS — C20 Malignant neoplasm of rectum: Secondary | ICD-10-CM | POA: Diagnosis not present

## 2020-04-01 LAB — SARS CORONAVIRUS 2 (TAT 6-24 HRS): SARS Coronavirus 2: NEGATIVE

## 2020-04-02 MED ORDER — BUPIVACAINE LIPOSOME 1.3 % IJ SUSP
20.0000 mL | Freq: Once | INTRAMUSCULAR | Status: DC
  Filled 2020-04-02: qty 20

## 2020-04-02 NOTE — Anesthesia Preprocedure Evaluation (Addendum)
Anesthesia Evaluation  Patient identified by MRN, date of birth, ID band Patient awake    Reviewed: Allergy & Precautions, NPO status , Patient's Chart, lab work & pertinent test results  History of Anesthesia Complications Negative for: history of anesthetic complications  Airway Mallampati: II  TM Distance: >3 FB Neck ROM: Full    Dental no notable dental hx.    Pulmonary neg pulmonary ROS,    Pulmonary exam normal        Cardiovascular hypertension, Pt. on medications Normal cardiovascular exam     Neuro/Psych negative neurological ROS     GI/Hepatic Neg liver ROS, Rectal cancer   Endo/Other  negative endocrine ROS  Renal/GU negative Renal ROS     Musculoskeletal negative musculoskeletal ROS (+)   Abdominal   Peds  Hematology  (+) HIV,   Anesthesia Other Findings   Reproductive/Obstetrics                            Anesthesia Physical Anesthesia Plan  ASA: III  Anesthesia Plan: General   Post-op Pain Management:    Induction: Intravenous  PONV Risk Score and Plan: 4 or greater and Ondansetron, Dexamethasone, Midazolam and Scopolamine patch - Pre-op  Airway Management Planned: Oral ETT  Additional Equipment:   Intra-op Plan:   Post-operative Plan: Extubation in OR  Informed Consent: I have reviewed the patients History and Physical, chart, labs and discussed the procedure including the risks, benefits and alternatives for the proposed anesthesia with the patient or authorized representative who has indicated his/her understanding and acceptance.     Dental advisory given  Plan Discussed with: Anesthesiologist and CRNA  Anesthesia Plan Comments:        Anesthesia Quick Evaluation

## 2020-04-03 ENCOUNTER — Inpatient Hospital Stay (HOSPITAL_COMMUNITY)
Admission: RE | Admit: 2020-04-03 | Discharge: 2020-04-07 | DRG: 331 | Disposition: A | Discharge status: Home Health | Payer: BC Managed Care – PPO | Attending: General Surgery | Admitting: General Surgery

## 2020-04-03 ENCOUNTER — Other Ambulatory Visit: Payer: Self-pay

## 2020-04-03 ENCOUNTER — Inpatient Hospital Stay (HOSPITAL_COMMUNITY): Payer: BC Managed Care – PPO | Admitting: Anesthesiology

## 2020-04-03 ENCOUNTER — Encounter (HOSPITAL_COMMUNITY): Payer: Self-pay | Admitting: General Surgery

## 2020-04-03 ENCOUNTER — Encounter (HOSPITAL_COMMUNITY)
Admission: RE | Disposition: A | Discharge status: Home / Self Care | Payer: Self-pay | Source: Home / Self Care | Attending: General Surgery

## 2020-04-03 DIAGNOSIS — E7800 Pure hypercholesterolemia, unspecified: Secondary | ICD-10-CM | POA: Diagnosis present

## 2020-04-03 DIAGNOSIS — Z87891 Personal history of nicotine dependence: Secondary | ICD-10-CM | POA: Diagnosis not present

## 2020-04-03 DIAGNOSIS — Z8249 Family history of ischemic heart disease and other diseases of the circulatory system: Secondary | ICD-10-CM | POA: Diagnosis not present

## 2020-04-03 DIAGNOSIS — I10 Essential (primary) hypertension: Secondary | ICD-10-CM | POA: Diagnosis present

## 2020-04-03 DIAGNOSIS — Z923 Personal history of irradiation: Secondary | ICD-10-CM | POA: Diagnosis not present

## 2020-04-03 DIAGNOSIS — C20 Malignant neoplasm of rectum: Principal | ICD-10-CM | POA: Diagnosis present

## 2020-04-03 DIAGNOSIS — Z9221 Personal history of antineoplastic chemotherapy: Secondary | ICD-10-CM | POA: Diagnosis not present

## 2020-04-03 DIAGNOSIS — Z20822 Contact with and (suspected) exposure to covid-19: Secondary | ICD-10-CM | POA: Diagnosis present

## 2020-04-03 DIAGNOSIS — Z21 Asymptomatic human immunodeficiency virus [HIV] infection status: Secondary | ICD-10-CM | POA: Diagnosis present

## 2020-04-03 HISTORY — PX: XI ROBOTIC ASSISTED LOWER ANTERIOR RESECTION: SHX6558

## 2020-04-03 HISTORY — PX: DIVERTING ILEOSTOMY: SHX5799

## 2020-04-03 LAB — TYPE AND SCREEN
ABO/RH(D): O POS
Antibody Screen: NEGATIVE

## 2020-04-03 SURGERY — RESECTION, RECTUM, LOW ANTERIOR, ROBOT-ASSISTED
Anesthesia: General | Site: Abdomen

## 2020-04-03 MED ORDER — ENSURE SURGERY PO LIQD
237.0000 mL | Freq: Two times a day (BID) | ORAL | Status: DC
  Administered 2020-04-04 – 2020-04-06 (×6): 237 mL via ORAL

## 2020-04-03 MED ORDER — GABAPENTIN 300 MG PO CAPS
300.0000 mg | ORAL_CAPSULE | Freq: Two times a day (BID) | ORAL | Status: DC
  Administered 2020-04-03 – 2020-04-07 (×9): 300 mg via ORAL
  Filled 2020-04-03 (×9): qty 1

## 2020-04-03 MED ORDER — FENTANYL CITRATE (PF) 250 MCG/5ML IJ SOLN
INTRAMUSCULAR | Status: AC
Start: 2020-04-03 — End: ?
  Filled 2020-04-03: qty 5

## 2020-04-03 MED ORDER — 0.9 % SODIUM CHLORIDE (POUR BTL) OPTIME
TOPICAL | Status: DC | PRN
  Administered 2020-04-03: 2000 mL

## 2020-04-03 MED ORDER — ALVIMOPAN 12 MG PO CAPS
12.0000 mg | ORAL_CAPSULE | Freq: Two times a day (BID) | ORAL | Status: DC
  Administered 2020-04-04 – 2020-04-05 (×3): 12 mg via ORAL
  Filled 2020-04-03 (×3): qty 1

## 2020-04-03 MED ORDER — MIDAZOLAM HCL 2 MG/2ML IJ SOLN
INTRAMUSCULAR | Status: AC
  Filled 2020-04-03: qty 2

## 2020-04-03 MED ORDER — PROPOFOL 10 MG/ML IV BOLUS
INTRAVENOUS | Status: DC | PRN
  Administered 2020-04-03: 200 mg via INTRAVENOUS

## 2020-04-03 MED ORDER — ACETAMINOPHEN 500 MG PO TABS
1000.0000 mg | ORAL_TABLET | Freq: Four times a day (QID) | ORAL | Status: DC
  Administered 2020-04-03 – 2020-04-07 (×14): 1000 mg via ORAL
  Filled 2020-04-03 (×15): qty 2

## 2020-04-03 MED ORDER — ENSURE PRE-SURGERY PO LIQD
592.0000 mL | Freq: Once | ORAL | Status: DC
  Filled 2020-04-03: qty 592

## 2020-04-03 MED ORDER — EPHEDRINE SULFATE-NACL 50-0.9 MG/10ML-% IV SOSY
PREFILLED_SYRINGE | INTRAVENOUS | Status: DC | PRN
  Administered 2020-04-03: 10 mg via INTRAVENOUS

## 2020-04-03 MED ORDER — FENTANYL CITRATE (PF) 100 MCG/2ML IJ SOLN
INTRAMUSCULAR | Status: AC
  Filled 2020-04-03: qty 2

## 2020-04-03 MED ORDER — KETAMINE HCL 10 MG/ML IJ SOLN
INTRAMUSCULAR | Status: DC | PRN
  Administered 2020-04-03 (×2): 10 mg via INTRAVENOUS
  Administered 2020-04-03: 40 mg via INTRAVENOUS

## 2020-04-03 MED ORDER — LIDOCAINE 2% (20 MG/ML) 5 ML SYRINGE
INTRAMUSCULAR | Status: AC
  Filled 2020-04-03: qty 5

## 2020-04-03 MED ORDER — ROCURONIUM BROMIDE 10 MG/ML (PF) SYRINGE
PREFILLED_SYRINGE | INTRAVENOUS | Status: AC
  Filled 2020-04-03: qty 10

## 2020-04-03 MED ORDER — HEPARIN SODIUM (PORCINE) 5000 UNIT/ML IJ SOLN
5000.0000 [IU] | Freq: Once | INTRAMUSCULAR | Status: AC
  Administered 2020-04-03: 5000 [IU] via SUBCUTANEOUS
  Filled 2020-04-03: qty 1

## 2020-04-03 MED ORDER — MIDAZOLAM HCL 2 MG/2ML IJ SOLN
INTRAMUSCULAR | Status: DC | PRN
  Administered 2020-04-03: 2 mg via INTRAVENOUS

## 2020-04-03 MED ORDER — ONDANSETRON HCL 4 MG PO TABS: 4.0000 mg | ORAL_TABLET | Freq: Four times a day (QID) | ORAL | Status: DC | PRN

## 2020-04-03 MED ORDER — PROMETHAZINE HCL 25 MG/ML IJ SOLN: 6.2500 mg | INTRAMUSCULAR | Status: DC | PRN

## 2020-04-03 MED ORDER — LOSARTAN POTASSIUM 25 MG PO TABS
25.0000 mg | ORAL_TABLET | Freq: Every day | ORAL | Status: DC
  Administered 2020-04-04 – 2020-04-07 (×4): 25 mg via ORAL
  Filled 2020-04-03 (×4): qty 1

## 2020-04-03 MED ORDER — ALVIMOPAN 12 MG PO CAPS
12.0000 mg | ORAL_CAPSULE | ORAL | Status: AC
  Administered 2020-04-03: 12 mg via ORAL
  Filled 2020-04-03: qty 1

## 2020-04-03 MED ORDER — SODIUM CHLORIDE 0.9 % IV SOLN
2.0000 g | INTRAVENOUS | Status: AC
  Administered 2020-04-03: 2 g via INTRAVENOUS
  Filled 2020-04-03: qty 2

## 2020-04-03 MED ORDER — ENSURE PRE-SURGERY PO LIQD: 296.0000 mL | Freq: Once | ORAL | Status: DC

## 2020-04-03 MED ORDER — FENTANYL CITRATE (PF) 100 MCG/2ML IJ SOLN: 25.0000 ug | INTRAMUSCULAR | Status: DC | PRN

## 2020-04-03 MED ORDER — CHLORHEXIDINE GLUCONATE 0.12 % MT SOLN
15.0000 mL | Freq: Once | OROMUCOSAL | Status: AC
  Administered 2020-04-03: 15 mL via OROMUCOSAL

## 2020-04-03 MED ORDER — BUPIVACAINE LIPOSOME 1.3 % IJ SUSP
INTRAMUSCULAR | Status: DC | PRN
  Administered 2020-04-03: 20 mL

## 2020-04-03 MED ORDER — HYDROMORPHONE HCL 1 MG/ML IJ SOLN
0.5000 mg | INTRAMUSCULAR | Status: DC | PRN
  Administered 2020-04-03 – 2020-04-05 (×6): 0.5 mg via INTRAVENOUS
  Filled 2020-04-03 (×7): qty 0.5

## 2020-04-03 MED ORDER — LIDOCAINE 2% (20 MG/ML) 5 ML SYRINGE
INTRAMUSCULAR | Status: DC | PRN
  Administered 2020-04-03: 100 mg via INTRAVENOUS

## 2020-04-03 MED ORDER — ONDANSETRON HCL 4 MG/2ML IJ SOLN
INTRAMUSCULAR | Status: AC
  Filled 2020-04-03: qty 2

## 2020-04-03 MED ORDER — BUPIVACAINE-EPINEPHRINE 0.25% -1:200000 IJ SOLN
INTRAMUSCULAR | Status: DC | PRN
  Administered 2020-04-03: 30 mL

## 2020-04-03 MED ORDER — FENTANYL CITRATE (PF) 250 MCG/5ML IJ SOLN
INTRAMUSCULAR | Status: DC | PRN
Start: 2020-04-03 — End: 2020-04-03
  Administered 2020-04-03 (×7): 50 ug via INTRAVENOUS
  Administered 2020-04-03: 100 ug via INTRAVENOUS

## 2020-04-03 MED ORDER — BUPIVACAINE-EPINEPHRINE (PF) 0.25% -1:200000 IJ SOLN
INTRAMUSCULAR | Status: AC
  Filled 2020-04-03: qty 30

## 2020-04-03 MED ORDER — SODIUM CHLORIDE 0.9 % IV SOLN
2.0000 g | Freq: Two times a day (BID) | INTRAVENOUS | Status: AC
  Administered 2020-04-03: 2 g via INTRAVENOUS
  Filled 2020-04-03: qty 2

## 2020-04-03 MED ORDER — CELECOXIB 200 MG PO CAPS
200.0000 mg | ORAL_CAPSULE | Freq: Once | ORAL | Status: AC
  Administered 2020-04-03: 200 mg via ORAL
  Filled 2020-04-03: qty 1

## 2020-04-03 MED ORDER — SCOPOLAMINE 1 MG/3DAYS TD PT72
1.0000 | MEDICATED_PATCH | TRANSDERMAL | Status: DC
  Administered 2020-04-03: 1.5 mg via TRANSDERMAL
  Filled 2020-04-03: qty 1

## 2020-04-03 MED ORDER — SACCHAROMYCES BOULARDII 250 MG PO CAPS
250.0000 mg | ORAL_CAPSULE | Freq: Two times a day (BID) | ORAL | Status: DC
  Administered 2020-04-03 – 2020-04-07 (×9): 250 mg via ORAL
  Filled 2020-04-03 (×9): qty 1

## 2020-04-03 MED ORDER — LACTATED RINGERS IV SOLN: INTRAVENOUS | Status: DC | PRN

## 2020-04-03 MED ORDER — LIDOCAINE HCL 2 % IJ SOLN
INTRAMUSCULAR | Status: AC
  Filled 2020-04-03: qty 20

## 2020-04-03 MED ORDER — BICTEGRAVIR-EMTRICITAB-TENOFOV 50-200-25 MG PO TABS
1.0000 | ORAL_TABLET | Freq: Every day | ORAL | Status: DC
  Administered 2020-04-04 – 2020-04-07 (×4): 1 via ORAL
  Filled 2020-04-03 (×5): qty 1

## 2020-04-03 MED ORDER — ONDANSETRON HCL 4 MG/2ML IJ SOLN: 4.0000 mg | Freq: Four times a day (QID) | INTRAMUSCULAR | Status: DC | PRN

## 2020-04-03 MED ORDER — ACETAMINOPHEN 500 MG PO TABS: 1000.0000 mg | ORAL_TABLET | Freq: Once | ORAL | Status: DC

## 2020-04-03 MED ORDER — ALUM & MAG HYDROXIDE-SIMETH 200-200-20 MG/5ML PO SUSP: 30.0000 mL | Freq: Four times a day (QID) | ORAL | Status: DC | PRN

## 2020-04-03 MED ORDER — SIMETHICONE 80 MG PO CHEW: 40.0000 mg | CHEWABLE_TABLET | Freq: Four times a day (QID) | ORAL | Status: DC | PRN

## 2020-04-03 MED ORDER — LACTATED RINGERS IR SOLN
Status: DC | PRN
  Administered 2020-04-03: 1000 mL

## 2020-04-03 MED ORDER — ONDANSETRON HCL 4 MG/2ML IJ SOLN
INTRAMUSCULAR | Status: DC | PRN
  Administered 2020-04-03: 4 mg via INTRAVENOUS

## 2020-04-03 MED ORDER — DIPHENHYDRAMINE HCL 25 MG PO CAPS: 25.0000 mg | ORAL_CAPSULE | Freq: Four times a day (QID) | ORAL | Status: DC | PRN

## 2020-04-03 MED ORDER — ACETAMINOPHEN 500 MG PO TABS
1000.0000 mg | ORAL_TABLET | ORAL | Status: AC
  Administered 2020-04-03: 1000 mg via ORAL
  Filled 2020-04-03: qty 2

## 2020-04-03 MED ORDER — DEXAMETHASONE SODIUM PHOSPHATE 10 MG/ML IJ SOLN
INTRAMUSCULAR | Status: AC
  Filled 2020-04-03: qty 1

## 2020-04-03 MED ORDER — KETAMINE HCL 10 MG/ML IJ SOLN
INTRAMUSCULAR | Status: AC
  Filled 2020-04-03: qty 1

## 2020-04-03 MED ORDER — SPY AGENT GREEN - (INDOCYANINE FOR INJECTION)
INTRAMUSCULAR | Status: DC | PRN
  Administered 2020-04-03: 3 mL via INTRAVENOUS

## 2020-04-03 MED ORDER — DIPHENHYDRAMINE HCL 50 MG/ML IJ SOLN: 25.0000 mg | Freq: Four times a day (QID) | INTRAMUSCULAR | Status: DC | PRN

## 2020-04-03 MED ORDER — GABAPENTIN 300 MG PO CAPS
300.0000 mg | ORAL_CAPSULE | ORAL | Status: AC
  Administered 2020-04-03: 300 mg via ORAL
  Filled 2020-04-03: qty 1

## 2020-04-03 MED ORDER — ROCURONIUM BROMIDE 10 MG/ML (PF) SYRINGE
PREFILLED_SYRINGE | INTRAVENOUS | Status: DC | PRN
  Administered 2020-04-03: 20 mg via INTRAVENOUS
  Administered 2020-04-03: 100 mg via INTRAVENOUS
  Administered 2020-04-03 (×2): 20 mg via INTRAVENOUS

## 2020-04-03 MED ORDER — LIDOCAINE 2% (20 MG/ML) 5 ML SYRINGE
INTRAMUSCULAR | Status: DC | PRN
  Administered 2020-04-03: 1.5 mg/kg/h via INTRAVENOUS

## 2020-04-03 MED ORDER — LACTATED RINGERS IV SOLN: INTRAVENOUS | Status: DC

## 2020-04-03 MED ORDER — PROPOFOL 10 MG/ML IV BOLUS
INTRAVENOUS | Status: AC
  Filled 2020-04-03: qty 20

## 2020-04-03 MED ORDER — ENOXAPARIN SODIUM 40 MG/0.4ML ~~LOC~~ SOLN
40.0000 mg | SUBCUTANEOUS | Status: DC
  Administered 2020-04-04 – 2020-04-07 (×4): 40 mg via SUBCUTANEOUS
  Filled 2020-04-03 (×4): qty 0.4

## 2020-04-03 MED ORDER — DEXAMETHASONE SODIUM PHOSPHATE 10 MG/ML IJ SOLN
INTRAMUSCULAR | Status: DC | PRN
  Administered 2020-04-03: 10 mg via INTRAVENOUS

## 2020-04-03 MED ORDER — SUGAMMADEX SODIUM 200 MG/2ML IV SOLN
INTRAVENOUS | Status: DC | PRN
  Administered 2020-04-03: 200 mg via INTRAVENOUS

## 2020-04-03 MED ORDER — AMLODIPINE BESYLATE 5 MG PO TABS
5.0000 mg | ORAL_TABLET | Freq: Every day | ORAL | Status: DC
  Administered 2020-04-03 – 2020-04-07 (×5): 5 mg via ORAL
  Filled 2020-04-03 (×5): qty 1

## 2020-04-03 MED ORDER — KCL IN DEXTROSE-NACL 20-5-0.45 MEQ/L-%-% IV SOLN
INTRAVENOUS | Status: DC
  Filled 2020-04-03 (×2): qty 1000

## 2020-04-03 MED ORDER — ORAL CARE MOUTH RINSE: 15.0000 mL | Freq: Once | OROMUCOSAL | Status: AC

## 2020-04-03 SURGICAL SUPPLY — 97 items
ADH SKN CLS APL DERMABOND .7 (GAUZE/BANDAGES/DRESSINGS) ×1
BARRIER SKIN 2 3/4 (OSTOMY) ×2 IMPLANT
BARRIER SKIN OD2.25 2 3/4 FLNG (OSTOMY) IMPLANT
BLADE EXTENDED COATED 6.5IN (ELECTRODE) IMPLANT
BRR SKN FLT 2.75X2.25 2 PC (OSTOMY) ×1
CANNULA REDUC XI 12-8 STAPL (CANNULA)
CANNULA REDUCER 12-8 DVNC XI (CANNULA) IMPLANT
CELLS DAT CNTRL 66122 CELL SVR (MISCELLANEOUS) IMPLANT
COVER SURGICAL LIGHT HANDLE (MISCELLANEOUS) ×4 IMPLANT
COVER TIP SHEARS 8 DVNC (MISCELLANEOUS) ×1 IMPLANT
COVER TIP SHEARS 8MM DA VINCI (MISCELLANEOUS) ×2
COVER WAND RF STERILE (DRAPES) ×2 IMPLANT
DECANTER SPIKE VIAL GLASS SM (MISCELLANEOUS) IMPLANT
DERMABOND ADVANCED (GAUZE/BANDAGES/DRESSINGS) ×1
DERMABOND ADVANCED .7 DNX12 (GAUZE/BANDAGES/DRESSINGS) IMPLANT
DRAIN CHANNEL 19F RND (DRAIN) ×1 IMPLANT
DRAPE ARM DVNC X/XI (DISPOSABLE) ×4 IMPLANT
DRAPE COLUMN DVNC XI (DISPOSABLE) ×1 IMPLANT
DRAPE DA VINCI XI ARM (DISPOSABLE) ×8
DRAPE DA VINCI XI COLUMN (DISPOSABLE) ×2
DRAPE SURG IRRIG POUCH 19X23 (DRAPES) ×2 IMPLANT
DRSG OPSITE POSTOP 4X10 (GAUZE/BANDAGES/DRESSINGS) IMPLANT
DRSG OPSITE POSTOP 4X6 (GAUZE/BANDAGES/DRESSINGS) IMPLANT
DRSG OPSITE POSTOP 4X8 (GAUZE/BANDAGES/DRESSINGS) IMPLANT
ELECT PENCIL ROCKER SW 15FT (MISCELLANEOUS) ×2 IMPLANT
ELECT REM PT RETURN 15FT ADLT (MISCELLANEOUS) ×2 IMPLANT
ENDOLOOP SUT PDS II  0 18 (SUTURE)
ENDOLOOP SUT PDS II 0 18 (SUTURE) IMPLANT
EVACUATOR SILICONE 100CC (DRAIN) ×1 IMPLANT
GLOVE BIO SURGEON STRL SZ 6.5 (GLOVE) ×6 IMPLANT
GLOVE BIOGEL PI IND STRL 7.0 (GLOVE) ×2 IMPLANT
GLOVE BIOGEL PI INDICATOR 7.0 (GLOVE) ×5
GOWN STRL REUS W/TWL XL LVL3 (GOWN DISPOSABLE) ×10 IMPLANT
GRASPER SUT TROCAR 14GX15 (MISCELLANEOUS) IMPLANT
HOLDER FOLEY CATH W/STRAP (MISCELLANEOUS) ×2 IMPLANT
IRRIG SUCT STRYKERFLOW 2 WTIP (MISCELLANEOUS) ×2
IRRIGATION SUCT STRKRFLW 2 WTP (MISCELLANEOUS) ×1 IMPLANT
KIT PROCEDURE DA VINCI SI (MISCELLANEOUS)
KIT PROCEDURE DVNC SI (MISCELLANEOUS) IMPLANT
KIT SIGMOIDOSCOPE (SET/KITS/TRAYS/PACK) ×2 IMPLANT
KIT TURNOVER KIT A (KITS) ×1 IMPLANT
NDL INSUFFLATION 14GA 120MM (NEEDLE) ×1 IMPLANT
NEEDLE INSUFFLATION 14GA 120MM (NEEDLE) ×2 IMPLANT
PACK CARDIOVASCULAR III (CUSTOM PROCEDURE TRAY) ×2 IMPLANT
PACK COLON (CUSTOM PROCEDURE TRAY) ×2 IMPLANT
PAD POSITIONING PINK XL (MISCELLANEOUS) ×2 IMPLANT
PORT LAP GEL ALEXIS MED 5-9CM (MISCELLANEOUS) ×1 IMPLANT
POUCH OSTOMY 2 3/4  H 3804 (WOUND CARE) ×2
POUCH OSTOMY 2 3/4 H 3804 (WOUND CARE) ×1
POUCH OSTOMY 2 PC DRNBL 2.75 (WOUND CARE) IMPLANT
RELOAD STAPLE 45 4.3 GRN DVNC (STAPLE) IMPLANT
RELOAD STAPLER 4.3X45 GRN DVNC (STAPLE) ×3 IMPLANT
RETRACTOR WND ALEXIS 18 MED (MISCELLANEOUS) IMPLANT
RTRCTR WOUND ALEXIS 18CM MED (MISCELLANEOUS)
SCISSORS LAP 5X35 DISP (ENDOMECHANICALS) IMPLANT
SEAL CANN UNIV 5-8 DVNC XI (MISCELLANEOUS) ×3 IMPLANT
SEAL XI 5MM-8MM UNIVERSAL (MISCELLANEOUS) ×8
SEALER VESSEL DA VINCI XI (MISCELLANEOUS) ×2
SEALER VESSEL EXT DVNC XI (MISCELLANEOUS) ×1 IMPLANT
SOLUTION ELECTROLUBE (MISCELLANEOUS) ×2 IMPLANT
SPONGE LAP 18X18 RF (DISPOSABLE) ×1 IMPLANT
STAPLER 45 DA VINCI SURE FORM (STAPLE) ×2
STAPLER 45 SUREFORM DVNC (STAPLE) IMPLANT
STAPLER 60 DA VINCI SURE FORM (STAPLE)
STAPLER 60 SUREFORM DVNC (STAPLE) IMPLANT
STAPLER CANNULA SEAL DVNC XI (STAPLE) IMPLANT
STAPLER CANNULA SEAL XI (STAPLE)
STAPLER ECHELON POWER CIR 29 (STAPLE) ×1 IMPLANT
STAPLER RELOAD 4.3X45 GREEN (STAPLE) ×6
STAPLER RELOAD 4.3X45 GRN DVNC (STAPLE) ×3
STOPCOCK 4 WAY LG BORE MALE ST (IV SETS) ×4 IMPLANT
SUT ETHILON 2 0 PS N (SUTURE) ×1 IMPLANT
SUT NOVA NAB DX-16 0-1 5-0 T12 (SUTURE) ×4 IMPLANT
SUT PROLENE 2 0 KS (SUTURE) ×1 IMPLANT
SUT SILK 2 0 (SUTURE) ×2
SUT SILK 2 0 SH CR/8 (SUTURE) IMPLANT
SUT SILK 2-0 18XBRD TIE 12 (SUTURE) ×1 IMPLANT
SUT SILK 3 0 (SUTURE)
SUT SILK 3 0 SH CR/8 (SUTURE) ×2 IMPLANT
SUT SILK 3-0 18XBRD TIE 12 (SUTURE) IMPLANT
SUT V-LOC BARB 180 2/0GR6 GS22 (SUTURE)
SUT VIC AB 2-0 SH 18 (SUTURE) ×2 IMPLANT
SUT VIC AB 2-0 SH 27 (SUTURE) ×2
SUT VIC AB 2-0 SH 27X BRD (SUTURE) IMPLANT
SUT VIC AB 3-0 SH 18 (SUTURE) IMPLANT
SUT VIC AB 4-0 PS2 27 (SUTURE) ×4 IMPLANT
SUT VICRYL 0 UR6 27IN ABS (SUTURE) ×2 IMPLANT
SUTURE V-LC BRB 180 2/0GR6GS22 (SUTURE) IMPLANT
SYR 10ML ECCENTRIC (SYRINGE) ×2 IMPLANT
SYS LAPSCP GELPORT 120MM (MISCELLANEOUS)
SYSTEM LAPSCP GELPORT 120MM (MISCELLANEOUS) IMPLANT
TOWEL OR 17X26 10 PK STRL BLUE (TOWEL DISPOSABLE) IMPLANT
TOWEL OR NON WOVEN STRL DISP B (DISPOSABLE) ×2 IMPLANT
TRAY FOLEY MTR SLVR 16FR STAT (SET/KITS/TRAYS/PACK) ×2 IMPLANT
TROCAR ADV FIXATION 5X100MM (TROCAR) ×2 IMPLANT
TUBING CONNECTING 10 (TUBING) ×4 IMPLANT
TUBING INSUFFLATION 10FT LAP (TUBING) ×2 IMPLANT

## 2020-04-03 NOTE — Op Note (Signed)
04/03/2020  12:18 PM  PATIENT:  Joshua Zhang  49 y.o. male  Patient Care Team: Merwyn Katos as PCP - General (Physician Assistant) Michel Bickers, MD as Consulting Physician (Infectious Diseases) Leighton Ruff, MD as Consulting Physician (General Surgery) Truitt Merle, MD as Consulting Physician (Hematology) Kyung Rudd, MD as Consulting Physician (Radiation Oncology) Jonnie Finner, RN as Oncology Nurse Navigator  PRE-OPERATIVE DIAGNOSIS:  RECTAL CANCER  POST-OPERATIVE DIAGNOSIS:  RECTAL CANCER  PROCEDURE:  XI ROBOTIC ASSISTED LOWER ANTERIOR RESECTION with COLOANAL ANASTOMOSIS, SPLENIC FLEXURE MOBILIZATION, INTRAOPERATIVE ASSESSMENT OF VASCULAR PERFUSION DIVERTING LOOP ILEOSTOMY   Surgeon(s): Leighton Ruff, MD Michael Boston, MD  ASSISTANT: Johney [place]   ANESTHESIA:   local and general  EBL: 119ml Total I/O In: 1000 [I.V.:1000] Out: 300 [Urine:175; Blood:125]  Delay start of Pharmacological VTE agent (>24hrs) due to surgical blood loss or risk of bleeding:  no  DRAINS: (12F) Jackson-Pratt drain(s) with closed bulb suction in the pelvis   SPECIMEN:  Source of Specimen:  Rectum and sigmoid  DISPOSITION OF SPECIMEN:  PATHOLOGY  COUNTS:  YES  PLAN OF CARE: Admit to inpatient   PATIENT DISPOSITION:  PACU - hemodynamically stable.  INDICATION:     Patient with large distal rectal cancer, s/p TNT.  I recommended low anterior resection:  The anatomy & physiology of the digestive tract was discussed.  The pathophysiology was discussed.  Natural history risks without surgery was discussed.   I worked to give an overview of the disease and the frequent need to have multispecialty involvement.  I feel the risks of no intervention will lead to serious problems that outweigh the operative risks; therefore, I recommended a partial colectomy to remove the pathology.  Laparoscopic & open techniques were discussed.   Risks such as bleeding, infection, abscess, leak,  reoperation, possible ostomy, hernia, heart attack, death, and other risks were discussed.  I noted a good likelihood this will help address the problem.   Goals of post-operative recovery were discussed as well.    The patient expressed understanding & wished to proceed with surgery.  OR FINDINGS:   Patient had anterior midline scar ~4 cm from anal verge.  No obvious metastatic disease on visceral parietal peritoneum or liver.  Nodular liver  The anastomosis rests 0 cm from the anal verge by rigid proctoscopy.  DESCRIPTION:   Informed consent was confirmed.  The patient underwent general anaesthesia without difficulty.  The patient was positioned appropriately.  VTE prevention in place.  The patient's abdomen was clipped, prepped, & draped in a sterile fashion.  Surgical timeout confirmed our plan.  The patient was positioned in reverse Trendelenburg.  Abdominal entry was gained using a Varies needle in the LUQ.  Entry was clean.  I induced carbon dioxide insufflation.  An 26mm robotic port was placed in the RUQ.  Camera inspection revealed no injury.  Extra ports were carefully placed under direct laparoscopic visualization.  I laparoscopically reflected the greater omentum and the upper abdomen the small bowel in the upper abdomen. The patient was appropriately positioned and the robot was docked to the patient's left side.  Instruments were placed under direct visualization.  There was a portion of small bowel that was adherent to the pelvic peritoneum.  This was dissected away using sharp dissection.  Once the terminal ileum was free I was able to elevate the sigmoid colon.   I mobilized the sigmoid colon off of the pelvic sidewall.  I scored the base of peritoneum of the  right side of the mesentery of the left colon from the ligament of Treitz to the peritoneal reflection of the mid rectum.  I elevated the sigmoid mesentery and enetered into the retro-mesenteric plane. We were able to identify  the left ureter and gonadal vessels. We kept those posterior within the retroperitoneum and elevated the left colon mesentery off that. I did isolated IMA pedicle but did not ligate it yet.  I continued distally and got into the avascular plane posterior to the mesorectum. This allowed me to help mobilize the rectum as well by freeing the mesorectum off the sacrum.  I mobilized the peritoneal coverings towards the peritoneal reflection on both the right and left sides of the rectum.  I could see the right and left ureters and stayed away from them.  I continue my dissection posteriorly using blunt dissection and the robotic vessel sealer.  I continued this dissection laterally on both sides.  I then divided the peritoneum on the right left side and entered into the peritoneal reflection anteriorly.  There was significant adhesions noted in the left anterior perirectal space around the seminal vesicles and prostate.  This was carefully dissected free using blunt dissection and circumferential evaluation.  I dissected posteriorly down to the level of the pelvic floor.  I brought this up laterally and completed the anterior dissection last.  Once I was circumferentially around the entire rectum at the pelvic floor, I evaluated the patient's rectum with digital rectal exam.  Scar was noted in the anterior midline approximately 4 cm from the anal verge.  I was able to get a green load 45 mm robotic stapler distal to this.  The rectum was transected using 3 green load staplers.   I skeletonized the inferior mesenteric artery pedicle.  I went down to its takeoff from the aorta.   I isolated the inferior mesenteric vein off of the ligament of Treitz just cephalad to that as well.  After confirming the left ureter was out of the way, I went ahead and ligated the inferior mesenteric artery pedicle with bipolar robotic vessel sealer ~2cm above its takeoff from the aorta.   I did ligate the inferior mesenteric vein in a  similar fashion.  We ensured hemostasis. I mobilized the left colon in a lateral to medial fashion off the line of Toldt up towards the splenic flexure to ensure good mobilization of the left colon to reach into the pelvis.  When testing this, I was unable to get this to mobilize to the pelvis therefore the splenic flexure was taken down using blunt dissection and the robotic vessel sealer.  This allowed for mobilization of the colon down to the level of the anastomosis with no tension.  A small defect was noted in the small bowel during this procedure and this was closed with a interrupted 2-0 Vicryl suture.  I then divided the mesentery just proximal to the ileocolic artery and vein using the robotic vessel sealer.  Perfusion was tested with firefly injection.  There was good perfusion of the colon down to the level of our dissection.  There was good perfusion of the remaining anal canal and pelvic floor as well.  The entire small bowel was ran from terminal ileum to ligament of Treitz and no other injuries were noted. Once this was completed, the robot was undocked.  I removed my 12 mm port from the patient's right lower quadrant previously marked ostomy site.  This was enlarged into an ostomy using electrocautery  to divide the fascia.  An [place] wound protector was placed.  The rectum and sigmoid was brought out through this and transected over a pursestring device.  A 2-0 Prolene pursestring was placed.  This was secured with 3-0 silk sutures and then tied around a 29 mm EEA anvil.  A coloanal anastomosis was created using the EEA stapler.  There was no tension on the colon.  The anastomotic rings were intact.  The distal anastomotic ring was sent as the final distal margin.  I then identified the terminal ileum.  The proximal end of the loop was marked and this was brought out through the right lower quadrant ostomy site.  The omentum was brought down and the abdomen was irrigated.  Hemostasis was good.  A  19 [place] Blake drain was placed into the pelvis and brought out through the right lower quadrant port site and secured into place with a 2-0 nylon suture.  The abdomen was desufflated and the ports were removed.  The port sites were closed with 4-0 Vicryl sutures and Dermabond.  The loop ileostomy was matured in standard [place] fashion using interrupted 2-0 Vicryl sutures.  An ostomy appliance and JP dressing were applied and the patient was awakened from anesthesia and sent to the postanesthesia care unit in stable condition.  All counts were correct per operating room staff.  An MD assistant was necessary for tissue manipulation, retraction and positioning due to the complexity of the case and hospital policies

## 2020-04-03 NOTE — Anesthesia Procedure Notes (Signed)
Procedure Name: Intubation Date/Time: 04/03/2020 8:36 AM Performed by: Sharlette Dense, CRNA Patient Re-evaluated:Patient Re-evaluated prior to induction Oxygen Delivery Method: Circle system utilized Preoxygenation: Pre-oxygenation with 100% oxygen Induction Type: IV induction Ventilation: Mask ventilation without difficulty and Oral airway inserted - appropriate to patient size Laryngoscope Size: Miller and 3 Grade View: Grade I Tube type: Oral Tube size: 8.0 mm Number of attempts: 1 Airway Equipment and Method: Stylet Placement Confirmation: ETT inserted through vocal cords under direct vision,  positive ETCO2 and breath sounds checked- equal and bilateral Secured at: 23 cm Tube secured with: Tape Dental Injury: Teeth and Oropharynx as per pre-operative assessment

## 2020-04-03 NOTE — H&P (Signed)
The patient is a 49 year old male who presents with colorectal cancer. 49 year old male who presented to my office with a new diagnosis of rectal cancer. This was seen on screening colonoscopy. Patient has noticed rectal bleeding for the past 2 years. He denied any weight loss or changes in bowel habits. Colonoscopy revealed multiple polyps throughout the colon and a lesion in the distal rectum. Biopsies confirm adenocarcinoma within the rectal lesion. I performed independent interpretation of the CT scan and MRI. CT scans show no signs of metastatic disease. MRI showed a distal rectal lesion which appearred to be abutting the internal sphincter complex. CEA is 2.5. He has underwent total neoadjuvant chemoradiation treatment. He completed his final dose of radiation on July 15. He is now here to discuss surgical resection.   Problem List/Past Medical Leighton Ruff, MD; 3/87/5643 2:37 PM) RECTAL CANCER (C20)   Past Surgical History Leighton Ruff, MD; 10/16/5186 2:37 PM) Colon Polyp Removal - Colonoscopy  Shoulder Surgery  Left.  Diagnostic Studies History Leighton Ruff, MD; 11/02/6061 2:37 PM) Colonoscopy  within last year  Allergies Emeline Gins, [place]; 02/05/2020 2:01 PM) No Known Drug Allergies  [08/15/2019]: Allergies Reconciled   Medication History Biktarvy (50-200-25MG  Tablet, Oral) Active. amLODIPine Besy-Benazepril HCl (10-20MG  Capsule, Oral) Active. Atorvastatin Calcium (10MG  Tablet, Oral) Active.   Social History Leighton Ruff, MD; 0/16/0109 2:37 PM) Alcohol use  Moderate alcohol use. Caffeine use  Coffee. No drug use  Tobacco use  Former smoker.  Family History Leighton Ruff, MD; 10/10/5571 2:37 PM) Breast Cancer  Mother. Heart Disease  Father.  Other Problems Leighton Ruff, MD; 09/08/2540 2:37 PM) High blood pressure  HIV-positive  Hypercholesterolemia     Review of Systems General Present- Fatigue and Weight Gain. Not  Present- Appetite Loss, Chills, Fever, Night Sweats and Weight Loss. Skin Present- Dryness. Not Present- Change in Wart/Mole, Hives, Jaundice, New Lesions, Non-Healing Wounds, Rash and Ulcer. HEENT Present- Seasonal Allergies. Not Present- Earache, Hearing Loss, Hoarseness, Nose Bleed, Oral Ulcers, Ringing in the Ears, Sinus Pain, Sore Throat, Visual Disturbances, Wears glasses/contact lenses and Yellow Eyes. Respiratory Not Present- Bloody sputum, Chronic Cough, Difficulty Breathing, Snoring and Wheezing. Breast Not Present- Breast Mass, Breast Pain, Nipple Discharge and Skin Changes. Cardiovascular Not Present- Chest Pain, Difficulty Breathing Lying Down, Leg Cramps, Palpitations, Rapid Heart Rate, Shortness of Breath and Swelling of Extremities. Gastrointestinal Present- Bloody Stool, Change in Bowel Habits and Chronic diarrhea. Not Present- Abdominal Pain, Bloating, Constipation, Difficulty Swallowing, Excessive gas, Gets full quickly at meals, Hemorrhoids, Indigestion, Nausea, Rectal Pain and Vomiting. Male Genitourinary Not Present- Blood in Urine, Change in Urinary Stream, Frequency, Impotence, Nocturia, Painful Urination, Urgency and Urine Leakage. Musculoskeletal Present- Back Pain. Not Present- Joint Pain, Joint Stiffness, Muscle Pain, Muscle Weakness and Swelling of Extremities. Neurological Not Present- Decreased Memory, Fainting, Headaches, Numbness, Seizures, Tingling, Tremor, Trouble walking and Weakness. Psychiatric Not Present- Anxiety, Bipolar, Change in Sleep Pattern, Depression, Fearful and Frequent crying. Endocrine Not Present- Cold Intolerance, Excessive Hunger, Hair Changes, Heat Intolerance and New Diabetes. Hematology Present- HIV. Not Present- Blood Thinners, Easy Bruising, Excessive bleeding, Gland problems and Persistent Infections.   Physical Exam  General Mental Status-Alert. General Appearance-Cooperative.  Abdomen Palpation/Percussion Palpation and  Percussion of the abdomen reveal - Soft and Non Tender.  Rectal Anorectal Exam External - normal external exam. Internal - normal sphincter tone. Note: Small nodule noted at anterior midline approximately 4 cm from anal verge.    Assessment & Plan  RECTAL CANCER (C20) Impression: 49 year old male who  presented to the office with a distal rectal cancer. MRI showed a stage III disease with an anterior tumor abutting the prostate. We discussed that surgical resection will be extremely difficult given the location of the tumor. He has completed total neoadjuvant chemotherapy and radiation and has had significant improvement in the size of the tumor,. It does appear that he will have enough margin to perform a coloanal anastomosis. We discussed that this would require a temporary ileostomy. The surgery and anatomy were described to the patient as well as the risks of surgery and the possible complications. These include: Bleeding, deep abdominal infections and possible wound complications such as hernia and infection, damage to adjacent structures, leak of surgical connections, which can lead to other surgeries and possibly an ostomy, possible need for other procedures, such as abscess drains in radiology, possible prolonged hospital stay, possible diarrhea from removal of part of the colon, possible constipation from narcotics, possible bowel, bladder or sexual dysfunction if having rectal surgery, prolonged fatigue/weakness or appetite loss, possible early recurrence of of disease, possible complications of their medical problems such as heart disease or arrhythmias or lung problems, death (less than 1%). I believe the patient understands and wishes to proceed with the surgery.

## 2020-04-03 NOTE — Transfer of Care (Signed)
Immediate Anesthesia Transfer of Care Note  Patient: Joshua Zhang  Procedure(s) Performed: XI ROBOTIC ASSISTED LOWER ANTERIOR RESECTION, SPLENIC FLEXURE MOBILIZATION, (N/A Abdomen) DIVERTING LOOP ILEOSTOMY (N/A Abdomen)  Patient Location: PACU  Anesthesia Type:General  Level of Consciousness: drowsy  Airway & Oxygen Therapy: Patient Spontanous Breathing and Patient connected to face mask oxygen  Post-op Assessment: Report given to RN and Post -op Vital signs reviewed and stable  Post vital signs: Reviewed and stable  Last Vitals:  Vitals Value Taken Time  BP 155/104 04/03/20 1230  Temp 37.1 C 04/03/20 1230  Pulse 97 04/03/20 1232  Resp 15 04/03/20 1232  SpO2 100 % 04/03/20 1232  Vitals shown include unvalidated device data.  Last Pain:  Vitals:   04/03/20 0635  TempSrc: Oral  PainSc: 0-No pain         Complications: No complications documented.

## 2020-04-03 NOTE — Anesthesia Postprocedure Evaluation (Signed)
Anesthesia Post Note  Patient: Joshua Zhang  Procedure(s) Performed: XI ROBOTIC ASSISTED LOWER ANTERIOR RESECTION, SPLENIC FLEXURE MOBILIZATION, (N/A Abdomen) DIVERTING LOOP ILEOSTOMY (N/A Abdomen)     Patient location during evaluation: PACU Anesthesia Type: General Level of consciousness: awake and alert, oriented and patient cooperative Pain management: pain level controlled Vital Signs Assessment: post-procedure vital signs reviewed and stable Respiratory status: spontaneous breathing, nonlabored ventilation and respiratory function stable Cardiovascular status: blood pressure returned to baseline and stable Postop Assessment: no apparent nausea or vomiting Anesthetic complications: no   No complications documented.  Last Vitals:  Vitals:   04/03/20 1315 04/03/20 1331  BP: (!) 157/93 (!) 152/96  Pulse: 86 67  Resp: 20 18  Temp: 36.7 C 36.7 C  SpO2: 97% 97%    Last Pain:  Vitals:   04/03/20 1331  TempSrc: Oral  PainSc:                  [name]

## 2020-04-04 ENCOUNTER — Encounter (HOSPITAL_COMMUNITY): Payer: Self-pay | Admitting: General Surgery

## 2020-04-04 LAB — BASIC METABOLIC PANEL
Anion gap: 8 (ref 5–15)
BUN: 13 mg/dL (ref 6–20)
CO2: 21 mmol/L — ABNORMAL LOW (ref 22–32)
Calcium: 8.3 mg/dL — ABNORMAL LOW (ref 8.9–10.3)
Chloride: 108 mmol/L (ref 98–111)
Creatinine, Ser: 1.09 mg/dL (ref 0.61–1.24)
GFR calc Af Amer: >60 mL/min (ref >60)
GFR calc non Af Amer: >60 mL/min (ref >60)
Glucose, Bld: 150 mg/dL — ABNORMAL HIGH (ref 70–99)
Potassium: 4.4 mmol/L (ref 3.5–5.1)
Sodium: 137 mmol/L (ref 135–145)

## 2020-04-04 LAB — CBC
HCT: 33 % — ABNORMAL LOW (ref 39.0–52.0)
Hemoglobin: 11.7 g/dL — ABNORMAL LOW (ref 13.0–17.0)
MCH: 37.3 pg — ABNORMAL HIGH (ref 26.0–34.0)
MCHC: 35.5 g/dL (ref 30.0–36.0)
MCV: 105.1 fL — ABNORMAL HIGH (ref 80.0–100.0)
Platelets: 130 10*3/uL — ABNORMAL LOW (ref 150–400)
RBC: 3.14 MIL/uL — ABNORMAL LOW (ref 4.22–5.81)
RDW: 11.9 % (ref 11.5–15.5)
WBC: 7.4 10*3/uL (ref 4.0–10.5)
nRBC: 0 % (ref 0.0–0.2)

## 2020-04-04 MED ORDER — CHLORHEXIDINE GLUCONATE CLOTH 2 % EX PADS
6.0000 | MEDICATED_PAD | Freq: Every day | CUTANEOUS | Status: DC
  Administered 2020-04-04 – 2020-04-07 (×3): 6 via TOPICAL

## 2020-04-04 NOTE — TOC Initial Note (Signed)
Transition of Care [facility]) - Initial/Assessment Note    Patient Details  Name: Joshua Zhang MRN: [id number] Date of Birth: 1971/03/11  Transition of Care [facility]) CM/SW Contact:    Lia Hopping, [place] Phone Number: 04/04/2020, 10:24 AM  Clinical Narrative:                 CSW met with the patient at bedside to discuss [facility] for ostomy care/education. Patient made aware this is not ongoing care, a few visit for teaching. Patient agreeable to [place] made a referral to [facility].   Physician please place Home Health orders.   Expected Discharge Plan: [place] Barriers to Discharge: Continued Medical Work up   Patient Goals and CMS Choice Patient states their goals for this hospitalization and ongoing recovery are:: return home   Choice offered to / list presented to : NA  Expected Discharge Plan and Services Expected Discharge Plan: [place] In-house Referral: NA Discharge Planning Services: NA Post Acute Care Choice: [place] arrangements for the past 2 months: Single Family Home                 DME Arranged: N/A DME Agency: NA       HH Arranged: RN [place] Agency: [place] Date [facility] Agency Contacted: 04/04/20 Time HH Agency Contacted: 81 Representative spoke with at [place]: Cindie  Prior Living Arrangements/Services Living arrangements for the past 2 months: [place] Lives with:: Spouse Patient language and need for interpreter reviewed:: Yes Do you feel safe going back to the place where you live?: Yes      Need for Family Participation in Patient Care: Yes (Comment) Care giver support system in place?: Yes (comment)   Criminal Activity/Legal Involvement Pertinent to Current Situation/Hospitalization: No - Comment as needed  Activities of Daily Living Home Assistive Devices/Equipment: None ADL Screening (condition at time of admission) Patient's cognitive ability adequate to  safely complete daily activities?: Yes Is the patient deaf or have difficulty hearing?: No Does the patient have difficulty seeing, even when wearing glasses/contacts?: No Does the patient have difficulty concentrating, remembering, or making decisions?: No Patient able to express need for assistance with ADLs?: Yes Does the patient have difficulty dressing or bathing?: No Independently performs ADLs?: Yes (appropriate for developmental age) Does the patient have difficulty walking or climbing stairs?: No Weakness of Legs: None Weakness of Arms/Hands: None  Permission Sought/Granted   Permission granted to share information with : Yes, Verbal Permission Granted  Share Information with NAME: [name] Rierson  Permission granted to share info w AGENCY: [place] granted to share info w Relationship: Spouse  Permission granted to share info w Contact Information: [phone]  Emotional Assessment Appearance:: Appears stated age Attitude/Demeanor/Rapport: Engaged Affect (typically observed): Accepting Orientation: : Oriented to Self, Oriented to Place, Oriented to  Time, Oriented to Situation Alcohol / Substance Use: Not Applicable Psych Involvement: No (comment)  Admission diagnosis:  Rectal cancer ([place]) [C20] Patient Active Problem List   Diagnosis Date Noted  . Rectal cancer ([place]) 04/03/2020  . Port-A-Cath in place 10/12/2019  . Rectal adenocarcinoma ([place]) 08/21/2019  . Injury of tendon of triceps 03/03/2019  . Mixed hyperlipidemia 07/26/2018  . Low back pain 09/29/2017  . Essential hypertension 04/01/2017  . Renal insufficiency 03/31/2017  . Dyslipidemia 03/31/2017  . HIV disease ([place]) 11/11/2016  . Tinea versicolor 04/22/2016   PCP:  Heywood Bene, PA-C  Pharmacy:   [facility] [address] [place] [address] Phone: [phone] Fax: [phone]     Social Determinants of  Health (SDOH) Interventions    Readmission Risk Interventions No flowsheet data found.

## 2020-04-04 NOTE — Consult Note (Signed)
[place] Nurse ostomy consult note Stoma type/location: RLQ, loop ileostomy Stomal assessment/size: 2" round, pink, moist, budded Peristomal assessment: NA Treatment options for stomal/peristomal skin: NA Output liquid green Ostomy pouching: 2pc. 2 3/4"  Education provided:  Patient and his wife at the bedside Explained role of ostomy nurse and creation of stoma  Explained stoma characteristics (budded, flush, color, texture, care) Education on emptying when 1/3 to 1/2 full and how to empty Demonstrated "burping" flatus from pouch Discussed diet and dehydration with patient  Enrolled patient in [place] program: Yes  Patient and wife engaged and had lots of questions, they have been watching videos on YouTube on emptying and changing pouches.   Planned pouch change and education with patient and his wife tomorrow at [place] taken to the room pouches and barrier rings.   [place] Nurse will follow along with you for continued support with ostomy teaching and care Lovilia MSN, RN, [place], [place], [place]

## 2020-04-04 NOTE — Progress Notes (Signed)
Applied new dressing to the JP drain with 4*4 and paper tape.

## 2020-04-04 NOTE — Progress Notes (Signed)
1 Day Post-Op Robotic LAR, diverting ileostomy Subjective:   Objective: Vital signs in last 24 hours: Temp:  [97.9 F (36.6 C)-98.8 F (37.1 C)] 97.9 F (36.6 C) (09/16 0601) Pulse Rate:  [57-90] 57 (09/16 0601) Resp:  [13-20] 17 (09/16 0601) BP: (114-157)/(70-104) 126/82 (09/16 0601) SpO2:  [95 %-100 %] 98 % (09/16 0601)   Intake/Output from previous day: 09/15 0701 - 09/16 0700 In: 4487.7 [P.O.:810; I.V.:3477.7; IV Piggyback:200] Out: 3027 [Urine:2475; Drains:332; Stool:95; Blood:125] Intake/Output this shift: No intake/output data recorded.   General appearance: alert and cooperative GI: soft, distended  Incision: no significant drainage  Lab Results:  Recent Labs    04/04/20 0327  WBC 7.4  HGB 11.7*  HCT 33.0*  PLT 130*   BMET Recent Labs    04/04/20 0327  NA 137  K 4.4  CL 108  CO2 21*  GLUCOSE 150*  BUN 13  CREATININE 1.09  CALCIUM 8.3*   PT/INR No results for input(s): LABPROT, INR in the last 72 hours. ABG No results for input(s): PHART, HCO3 in the last 72 hours.  Invalid input(s): PCO2, PO2  MEDS, Scheduled . acetaminophen  1,000 mg Oral Q6H  . alvimopan  12 mg Oral BID  . amLODipine  5 mg Oral Daily  . bictegravir-emtricitabine-tenofovir AF  1 tablet Oral Daily  . Chlorhexidine Gluconate Cloth  6 each Topical Daily  . enoxaparin (LOVENOX) injection  40 mg Subcutaneous Q24H  . feeding supplement  237 mL Oral BID BM  . gabapentin  300 mg Oral BID  . losartan  25 mg Oral Daily  . saccharomyces boulardii  250 mg Oral BID    Studies/Results: No results found.  Assessment: s/p Procedure(s): XI ROBOTIC ASSISTED LOWER ANTERIOR RESECTION, SPLENIC FLEXURE MOBILIZATION, RIGID PROCTOSCOPY DIVERTING LOOP ILEOSTOMY Patient Active Problem List   Diagnosis Date Noted  . Rectal cancer ([place]) 04/03/2020  . Port-A-Cath in place 10/12/2019  . Rectal adenocarcinoma ([place]) 08/21/2019  . Injury of tendon of triceps 03/03/2019  . Mixed  hyperlipidemia 07/26/2018  . Low back pain 09/29/2017  . Essential hypertension 04/01/2017  . Renal insufficiency 03/31/2017  . Dyslipidemia 03/31/2017  . HIV disease ([place]) 11/11/2016  . Tinea versicolor 04/22/2016    Expected post op course  Plan: Advance diet as tolerated Monitor ostomy output Ambulate in hall Begin ostomy teaching SL IVF's for now   LOS: 1 day     .Rosario Adie, [facility], [place]    04/04/2020 8:02 AM

## 2020-04-05 LAB — CBC
HCT: 33 % — ABNORMAL LOW (ref 39.0–52.0)
Hemoglobin: 11.4 g/dL — ABNORMAL LOW (ref 13.0–17.0)
MCH: 37.4 pg — ABNORMAL HIGH (ref 26.0–34.0)
MCHC: 34.5 g/dL (ref 30.0–36.0)
MCV: 108.2 fL — ABNORMAL HIGH (ref 80.0–100.0)
Platelets: 121 10*3/uL — ABNORMAL LOW (ref 150–400)
RBC: 3.05 MIL/uL — ABNORMAL LOW (ref 4.22–5.81)
RDW: 12.3 % (ref 11.5–15.5)
WBC: 5.2 10*3/uL (ref 4.0–10.5)
nRBC: 0 % (ref 0.0–0.2)

## 2020-04-05 LAB — BASIC METABOLIC PANEL
Anion gap: 6 (ref 5–15)
BUN: 16 mg/dL (ref 6–20)
CO2: 25 mmol/L (ref 22–32)
Calcium: 8.3 mg/dL — ABNORMAL LOW (ref 8.9–10.3)
Chloride: 108 mmol/L (ref 98–111)
Creatinine, Ser: 1.18 mg/dL (ref 0.61–1.24)
GFR calc Af Amer: >60 mL/min (ref >60)
GFR calc non Af Amer: >60 mL/min (ref >60)
Glucose, Bld: 102 mg/dL — ABNORMAL HIGH (ref 70–99)
Potassium: 4 mmol/L (ref 3.5–5.1)
Sodium: 139 mmol/L (ref 135–145)

## 2020-04-05 LAB — SURGICAL PATHOLOGY

## 2020-04-05 MED ORDER — TRAMADOL HCL 50 MG PO TABS: 50.0000 mg | ORAL_TABLET | Freq: Four times a day (QID) | ORAL | Status: DC | PRN

## 2020-04-05 NOTE — Consult Note (Signed)
[place] Nurse ostomy follow up Stoma type/location: RLQ, loop ileostomy  Stomal assessment/size: 1 1/4" budded, pink, moist, red rubber catheter in place, os centrally located Peristomal assessment: intact Treatment options for stomal/peristomal skin: using 2" barrier ring to aid in seal and protect skin from ileostomy output Output liquid green Ostomy pouching: 2pc. 2 3/4"  Education provided:  Explained stoma characteristics (budded, flush, color, texture, care) Demonstrated pouch change (cutting new skin barrier, measuring stoma, cleaning peristomal skin and stoma, use of barrier ring) Allowed wife to cut new skin barrier, attach pouch to the skin barrier Education on emptying when 1/3 to 1/2 full and how to empty; encouraged patient to empty pouch on his own making sure to keep in graduate for volume measurements  Demonstrated "burping" flatus from pouch Demonstrated use of wick to clean spout  Discussed bathing, diet, gas, medication use, constipation Discussed risk of peristomal hernia Provided patient with [employer] and marked items currently using Answered patient/family questions:   Discussed sexual issues with patient and his wife.   Enrolled patient in [place] Start Discharge program: Yes  [place] Nurse will follow along with you for continued support with ostomy teaching and care [place] MSN, [place], [place], [place], [place]

## 2020-04-05 NOTE — Discharge Instructions (Signed)
ABDOMINAL SURGERY: POST OP INSTRUCTIONS  1. DIET: Follow a light bland diet the first 24 hours after arrival home, such as soup, liquids, crackers, etc.  Be sure to include lots of fluids daily.  Avoid fast food or heavy meals as your are more likely to get nauseated.  Do not eat any uncooked fruits or vegetables for the next 2 weeks as your colon heals. 2. Take your usually prescribed home medications unless otherwise directed. 3. PAIN CONTROL: a. Pain is best controlled by a usual combination of three different methods TOGETHER: i. Ice/Heat ii. Over the counter pain medication iii. Prescription pain medication b. Most patients will experience some swelling and bruising around the incisions.  Ice packs or heating pads (30-60 minutes up to 6 times a day) will help. Use ice for the first few days to help decrease swelling and bruising, then switch to heat to help relax tight/sore spots and speed recovery.  Some people prefer to use ice alone, heat alone, alternating between ice & heat.  Experiment to what works for you.  Swelling and bruising can take several weeks to resolve.   c. It is helpful to take an over-the-counter pain medication regularly for the first few weeks.  Choose one of the following that works best for you: i. Naproxen (Aleve, etc)  Two 220mg  tabs twice a day ii. Ibuprofen (Advil, etc) Three 200mg  tabs four times a day (every meal & bedtime) iii. Acetaminophen (Tylenol, etc) 500-650mg  four times a day (every meal & bedtime) d. A  prescription for pain medication (such as oxycodone, hydrocodone, etc) should be given to you upon discharge.  Take your pain medication as prescribed.  i. If you are having problems/concerns with the prescription medicine (does not control pain, nausea, vomiting, rash, itching, etc), please call us [phone] to see if we need to switch you to a different pain medicine that will work better for you and/or control your side effect better. ii. If you  need a refill on your pain medication, please contact your pharmacy.  They will contact our office to request authorization. Prescriptions will not be filled after 5 pm or on week-ends. 4. Watch out for diarrhea.  Your ostomy can make you dehydrated quickly.  You should increase fluid intake until your urine is a pale yellow color.  Monitor and record your ostomy output daily.  Call the office if your output is > 1088ml over a 24 h period.    5. Wash / shower every day.  You may shower over the incision / wound.  Avoid baths until the skin is fully healed.  You may leave the incision open to air.  You may replace a dressing/Band-Aid to cover the incision for comfort if you wish. 6. ACTIVITIES as tolerated:   a. You may resume regular (light) daily activities beginning the next day--such as daily self-care, walking, climbing stairs--gradually increasing activities as tolerated.  If you can walk 30 minutes without difficulty, it is safe to try more intense activity such as jogging, treadmill, bicycling, low-impact aerobics, swimming, etc. b. Save the most intensive and strenuous activity for last such as sit-ups, heavy lifting, contact sports, etc  Refrain from any heavy lifting or straining until you are off narcotics for pain control.   c. DO NOT PUSH THROUGH PAIN.  Let pain be your guide: If it hurts to do something, don't do it.  Pain is your body warning you to avoid that activity for another week until the pain  goes down. d. You may drive when you are no longer taking prescription pain medication, you can comfortably wear a seatbelt, and you can safely maneuver your car and apply brakes. e. Dennis Bast may have sexual intercourse when it is comfortable.  7. FOLLOW UP in our office a. Please call [facility] at [phone] to set up an appointment to see your surgeon in the office for a follow-up appointment approximately 1-2 weeks after your surgery. b. Make sure that you call for this appointment the day you  arrive home to insure a convenient appointment time. 10. IF YOU HAVE DISABILITY OR FAMILY LEAVE FORMS, BRING THEM TO THE OFFICE FOR PROCESSING.  DO NOT GIVE THEM TO YOUR DOCTOR.   WHEN TO CALL us [phone]: 1. Poor pain control 2. Reactions / problems with new medications (rash/itching, nausea, etc)  3. Fever over 101.5 F (38.5 C) 4. Inability to urinate 5. Nausea and/or vomiting 6. Worsening swelling or bruising 7. Continued bleeding from incision. 8. Increased pain, redness, or drainage from the incision  The clinic staff is available to answer your questions during regular business hours (8:30am-5pm).  Please dont hesitate to call and ask to speak to one of our nurses for clinical concerns.   A surgeon from [facility] is always on call at the hospitals   If you have a medical emergency, go to the nearest emergency room or call 911.    [facility], [place], [place], [place], [place]  [address] ? MAIN: [phone] ? TOLL FREE: [phone] ? FAX [phone] [email or web address]

## 2020-04-05 NOTE — Progress Notes (Signed)
2 Days Post-Op Robotic LAR, diverting ileostomy Subjective: Doing well.  Tolerating a diet.  Min pain.  Good ostomy output  Objective: Vital signs in last 24 hours: Temp:  [97.9 F (36.6 C)-98.6 F (37 C)] 97.9 F (36.6 C) (09/17 0619) Pulse Rate:  [58-79] 79 (09/17 0619) Resp:  [18] 18 (09/17 0619) BP: (126-140)/(86-94) 140/86 (09/17 0619) SpO2:  [98 %] 98 % (09/17 0619)   Intake/Output from previous day: 09/16 0701 - 09/17 0700 In: 1528.1 [P.O.:1220; I.V.:288.1] Out: 3445 [Urine:2550; Drains:120; Stool:775] Intake/Output this shift: Total I/O In: -  Out: 1020 [Urine:750; Drains:95; Stool:175]   General appearance: alert and cooperative GI: soft, distended  Incision: no significant drainage  Lab Results:  Recent Labs    04/04/20 0327 04/05/20 0321  WBC 7.4 5.2  HGB 11.7* 11.4*  HCT 33.0* 33.0*  PLT 130* 121*   BMET Recent Labs    04/04/20 0327 04/05/20 0321  NA 137 139  K 4.4 4.0  CL 108 108  CO2 21* 25  GLUCOSE 150* 102*  BUN 13 16  CREATININE 1.09 1.18  CALCIUM 8.3* 8.3*   PT/INR No results for input(s): LABPROT, INR in the last 72 hours. ABG No results for input(s): PHART, HCO3 in the last 72 hours.  Invalid input(s): PCO2, PO2  MEDS, Scheduled . acetaminophen  1,000 mg Oral Q6H  . amLODipine  5 mg Oral Daily  . bictegravir-emtricitabine-tenofovir AF  1 tablet Oral Daily  . Chlorhexidine Gluconate Cloth  6 each Topical Daily  . enoxaparin (LOVENOX) injection  40 mg Subcutaneous Q24H  . feeding supplement  237 mL Oral BID BM  . gabapentin  300 mg Oral BID  . losartan  25 mg Oral Daily  . saccharomyces boulardii  250 mg Oral BID    Studies/Results: No results found.  Assessment: s/p Procedure(s): XI ROBOTIC ASSISTED LOWER ANTERIOR RESECTION, SPLENIC FLEXURE MOBILIZATION, RIGID PROCTOSCOPY DIVERTING LOOP ILEOSTOMY Patient Active Problem List   Diagnosis Date Noted  . Rectal cancer ([place]) 04/03/2020  . Port-A-Cath in place 10/12/2019   . Rectal adenocarcinoma ([place]) 08/21/2019  . Injury of tendon of triceps 03/03/2019  . Mixed hyperlipidemia 07/26/2018  . Low back pain 09/29/2017  . Essential hypertension 04/01/2017  . Renal insufficiency 03/31/2017  . Dyslipidemia 03/31/2017  . HIV disease ([place]) 11/11/2016  . Tinea versicolor 04/22/2016    Expected post op course  Plan: Cont diet Monitor ostomy output Ambulate in hall Cont ostomy teaching SL IVF's for now, good UOP.  Ileostomy output < 1L Can remove ostomy bar tom D/C JP   LOS: 2 days     .Rosario Adie, MD [facility], [place]    04/05/2020 1:45 PM

## 2020-04-06 LAB — CBC
HCT: 36 % — ABNORMAL LOW (ref 39.0–52.0)
Hemoglobin: 12.6 g/dL — ABNORMAL LOW (ref 13.0–17.0)
MCH: 36.6 pg — ABNORMAL HIGH (ref 26.0–34.0)
MCHC: 35 g/dL (ref 30.0–36.0)
MCV: 104.7 fL — ABNORMAL HIGH (ref 80.0–100.0)
Platelets: 133 10*3/uL — ABNORMAL LOW (ref 150–400)
RBC: 3.44 MIL/uL — ABNORMAL LOW (ref 4.22–5.81)
RDW: 12 % (ref 11.5–15.5)
WBC: 4.8 10*3/uL (ref 4.0–10.5)
nRBC: 0 % (ref 0.0–0.2)

## 2020-04-06 LAB — BASIC METABOLIC PANEL
Anion gap: 8 (ref 5–15)
BUN: 16 mg/dL (ref 6–20)
CO2: 24 mmol/L (ref 22–32)
Calcium: 9.2 mg/dL (ref 8.9–10.3)
Chloride: 107 mmol/L (ref 98–111)
Creatinine, Ser: 1 mg/dL (ref 0.61–1.24)
GFR calc Af Amer: >60 mL/min (ref >60)
GFR calc non Af Amer: >60 mL/min (ref >60)
Glucose, Bld: 94 mg/dL (ref 70–99)
Potassium: 3.9 mmol/L (ref 3.5–5.1)
Sodium: 139 mmol/L (ref 135–145)

## 2020-04-06 NOTE — Progress Notes (Signed)
3 Days Post-Op   Subjective/Chief Complaint: Doing well Had over 850 in bag yesterday and already full this am  No pain eating well    Objective: Vital signs in last 24 hours: Temp:  [98 F (36.7 C)-98.4 F (36.9 C)] 98.3 F (36.8 C) (09/18 0539) Pulse Rate:  [58-59] 58 (09/18 0539) Resp:  [16-19] 16 (09/18 0539) BP: (119-127)/(80) 122/80 (09/18 0539) SpO2:  [95 %-98 %] 95 % (09/18 0539) Last BM Date: 04/05/20  Intake/Output from previous day: 09/17 0701 - 09/18 0700 In: 600 [P.O.:600] Out: 3945 [Urine:3000; Drains:95; Stool:850] Intake/Output this shift: Total I/O In: -  Out: 450 [Urine:300; Stool:150]  General appearance: alert and cooperative GI: soft, non distended   Incision: no significant drainage/ CDI ostomy with bridge   Lab Results:  Recent Labs    04/05/20 0321 04/06/20 0402  WBC 5.2 4.8  HGB 11.4* 12.6*  HCT 33.0* 36.0*  PLT 121* 133*   BMET Recent Labs    04/05/20 0321 04/06/20 0402  NA 139 139  K 4.0 3.9  CL 108 107  CO2 25 24  GLUCOSE 102* 94  BUN 16 16  CREATININE 1.18 1.00  CALCIUM 8.3* 9.2   PT/INR No results for input(s): LABPROT, INR in the last 72 hours. ABG No results for input(s): PHART, HCO3 in the last 72 hours.  Invalid input(s): PCO2, PO2  Studies/Results: No results found.  Anti-infectives: Anti-infectives (From admission, onward)   Start     Dose/Rate Route Frequency Ordered Stop   04/03/20 2100  cefoTEtan (CEFOTAN) 2 g in sodium chloride 0.9 % 100 mL IVPB        2 g 200 mL/hr over 30 Minutes Intravenous Every 12 hours 04/03/20 1320 04/03/20 2118   04/03/20 1800  bictegravir-emtricitabine-tenofovir AF (BIKTARVY) 50-200-25 MG per tablet 1 tablet        1 tablet Oral Daily 04/03/20 1320     04/03/20 0630  cefoTEtan (CEFOTAN) 2 g in sodium chloride 0.9 % 100 mL IVPB        2 g 200 mL/hr over 30 Minutes Intravenous On call to O.R. 04/03/20 4827 04/03/20 0910      Assessment/Plan: s/p Procedure(s): XI ROBOTIC  ASSISTED LOWER ANTERIOR RESECTION, SPLENIC FLEXURE MOBILIZATION, RIGID PROCTOSCOPY (N/A) DIVERTING LOOP ILEOSTOMY (N/A)   Keep for now due to output from ileostomy  Remove bridge tomorrow  Continue to monitor output  Ambulate   LOS: 3 days    Turner Daniels MD  04/06/2020

## 2020-04-06 NOTE — Progress Notes (Signed)
Patient instructed on how to empty ostomy. Patient helped with process.

## 2020-04-07 MED ORDER — TRAMADOL HCL 50 MG PO TABS: 50.0000 mg | ORAL_TABLET | Freq: Four times a day (QID) | ORAL | 0 refills | Status: DC | PRN

## 2020-04-07 NOTE — Progress Notes (Signed)
4 Days Post-Op   Subjective/Chief Complaint: No complaints. Wants to go home   Objective: Vital signs in last 24 hours: Temp:  [97.9 F (36.6 C)-98.1 F (36.7 C)] 98.1 F (36.7 C) (09/19 0528) Pulse Rate:  [67-77] 68 (09/19 0528) Resp:  [17-20] 17 (09/19 0528) BP: (110-118)/(73-77) 110/73 (09/19 0528) SpO2:  [96 %-99 %] 97 % (09/19 0528) Last BM Date: (P) 04/07/20  Intake/Output from previous day: 09/18 0701 - 09/19 0700 In: 480 [P.O.:480] Out: 4525 [Urine:3700; Stool:825] Intake/Output this shift: No intake/output data recorded.  General appearance: alert and cooperative Resp: clear to auscultation bilaterally Cardio: regular rate and rhythm GI: soft, nontender. ostomy pink and productive  Lab Results:  Recent Labs    04/05/20 0321 04/06/20 0402  WBC 5.2 4.8  HGB 11.4* 12.6*  HCT 33.0* 36.0*  PLT 121* 133*   BMET Recent Labs    04/05/20 0321 04/06/20 0402  NA 139 139  K 4.0 3.9  CL 108 107  CO2 25 24  GLUCOSE 102* 94  BUN 16 16  CREATININE 1.18 1.00  CALCIUM 8.3* 9.2   PT/INR No results for input(s): LABPROT, INR in the last 72 hours. ABG No results for input(s): PHART, HCO3 in the last 72 hours.  Invalid input(s): PCO2, PO2  Studies/Results: No results found.  Anti-infectives: Anti-infectives (From admission, onward)   Start     Dose/Rate Route Frequency Ordered Stop   04/03/20 2100  cefoTEtan (CEFOTAN) 2 g in sodium chloride 0.9 % 100 mL IVPB        2 g 200 mL/hr over 30 Minutes Intravenous Every 12 hours 04/03/20 1320 04/03/20 2118   04/03/20 1800  bictegravir-emtricitabine-tenofovir AF (BIKTARVY) 50-200-25 MG per tablet 1 tablet        1 tablet Oral Daily 04/03/20 1320     04/03/20 0630  cefoTEtan (CEFOTAN) 2 g in sodium chloride 0.9 % 100 mL IVPB        2 g 200 mL/hr over 30 Minutes Intravenous On call to O.R. 04/03/20 9093 04/03/20 0910      Assessment/Plan: s/p Procedure(s): XI ROBOTIC ASSISTED LOWER ANTERIOR RESECTION, SPLENIC  FLEXURE MOBILIZATION, RIGID PROCTOSCOPY (N/A) DIVERTING LOOP ILEOSTOMY (N/A) Advance diet Discharge  LOS: 4 days    Joshua Zhang 04/07/2020

## 2020-04-07 NOTE — Plan of Care (Signed)
Pt [place] home with [facility]

## 2020-04-10 NOTE — Discharge Summary (Signed)
Patient ID: Joshua Zhang [id number] 49 y.o. [birth year]  04/03/2020  Discharge date and time: 04/07/2020 12:36 PM  Admitting Physician: Rosario Adie  Discharge Physician: Rosario Adie  Admission Diagnoses: Rectal cancer [facility]) [place]  Discharge Diagnoses: rectal cancer  Operations:  XI ROBOTIC ASSISTED LOWER ANTERIOR RESECTION, SPLENIC FLEXURE MOBILIZATION, RIGID PROCTOSCOPY DIVERTING LOOP ILEOSTOMY    Discharged Condition: good    Hospital Course: Pt was admitted after surgery.  Diet was advanced.  Was sent home once ostomy outputs were low enough and was tolerating a diet.  Consults: None  Significant Diagnostic Studies: labs: cbc, bmet  Treatments: IV hydration  Disposition: Home

## 2020-04-12 DIAGNOSIS — Z87891 Personal history of nicotine dependence: Secondary | ICD-10-CM | POA: Diagnosis not present

## 2020-04-12 DIAGNOSIS — Z432 Encounter for attention to ileostomy: Secondary | ICD-10-CM | POA: Diagnosis not present

## 2020-04-12 DIAGNOSIS — C19 Malignant neoplasm of rectosigmoid junction: Secondary | ICD-10-CM | POA: Diagnosis not present

## 2020-04-12 DIAGNOSIS — E7800 Pure hypercholesterolemia, unspecified: Secondary | ICD-10-CM | POA: Diagnosis not present

## 2020-04-12 DIAGNOSIS — Z9181 History of falling: Secondary | ICD-10-CM | POA: Diagnosis not present

## 2020-04-12 DIAGNOSIS — Z86010 Personal history of colonic polyps: Secondary | ICD-10-CM | POA: Diagnosis not present

## 2020-04-12 DIAGNOSIS — I10 Essential (primary) hypertension: Secondary | ICD-10-CM | POA: Diagnosis not present

## 2020-04-17 ENCOUNTER — Other Ambulatory Visit: Payer: Self-pay

## 2020-04-19 DIAGNOSIS — E7800 Pure hypercholesterolemia, unspecified: Secondary | ICD-10-CM | POA: Diagnosis not present

## 2020-04-19 DIAGNOSIS — C19 Malignant neoplasm of rectosigmoid junction: Secondary | ICD-10-CM | POA: Diagnosis not present

## 2020-04-19 DIAGNOSIS — Z86010 Personal history of colonic polyps: Secondary | ICD-10-CM | POA: Diagnosis not present

## 2020-04-19 DIAGNOSIS — Z87891 Personal history of nicotine dependence: Secondary | ICD-10-CM | POA: Diagnosis not present

## 2020-04-19 DIAGNOSIS — Z432 Encounter for attention to ileostomy: Secondary | ICD-10-CM | POA: Diagnosis not present

## 2020-04-19 DIAGNOSIS — I10 Essential (primary) hypertension: Secondary | ICD-10-CM | POA: Diagnosis not present

## 2020-04-19 DIAGNOSIS — Z9181 History of falling: Secondary | ICD-10-CM | POA: Diagnosis not present

## 2020-04-25 DIAGNOSIS — C19 Malignant neoplasm of rectosigmoid junction: Secondary | ICD-10-CM | POA: Diagnosis not present

## 2020-04-25 DIAGNOSIS — E7800 Pure hypercholesterolemia, unspecified: Secondary | ICD-10-CM | POA: Diagnosis not present

## 2020-04-25 DIAGNOSIS — I10 Essential (primary) hypertension: Secondary | ICD-10-CM | POA: Diagnosis not present

## 2020-04-25 DIAGNOSIS — Z87891 Personal history of nicotine dependence: Secondary | ICD-10-CM | POA: Diagnosis not present

## 2020-04-25 DIAGNOSIS — Z86010 Personal history of colonic polyps: Secondary | ICD-10-CM | POA: Diagnosis not present

## 2020-04-25 DIAGNOSIS — Z432 Encounter for attention to ileostomy: Secondary | ICD-10-CM | POA: Diagnosis not present

## 2020-04-25 DIAGNOSIS — Z9181 History of falling: Secondary | ICD-10-CM | POA: Diagnosis not present

## 2020-04-25 MED FILL — BIKTARVY 50-200-25 MG TABS: 50-200-25 | 30 days supply | Qty: 30 | Fill #4

## 2020-04-26 DIAGNOSIS — Z432 Encounter for attention to ileostomy: Secondary | ICD-10-CM | POA: Diagnosis not present

## 2020-05-01 DIAGNOSIS — C19 Malignant neoplasm of rectosigmoid junction: Secondary | ICD-10-CM | POA: Diagnosis not present

## 2020-05-01 DIAGNOSIS — Z432 Encounter for attention to ileostomy: Secondary | ICD-10-CM | POA: Diagnosis not present

## 2020-05-01 DIAGNOSIS — Z86010 Personal history of colonic polyps: Secondary | ICD-10-CM | POA: Diagnosis not present

## 2020-05-01 DIAGNOSIS — I10 Essential (primary) hypertension: Secondary | ICD-10-CM | POA: Diagnosis not present

## 2020-05-01 DIAGNOSIS — Z9181 History of falling: Secondary | ICD-10-CM | POA: Diagnosis not present

## 2020-05-01 DIAGNOSIS — Z87891 Personal history of nicotine dependence: Secondary | ICD-10-CM | POA: Diagnosis not present

## 2020-05-01 DIAGNOSIS — E7800 Pure hypercholesterolemia, unspecified: Secondary | ICD-10-CM | POA: Diagnosis not present

## 2020-05-01 NOTE — Progress Notes (Signed)
[place]   [phone] [phone]   Clinic Follow up Note   Patient Care Team: Merwyn Katos as PCP - General (Physician Assistant) Michel Bickers, MD as Consulting Physician (Infectious Diseases) Leighton Ruff, MD as Consulting Physician (General Surgery) Truitt Merle, MD as Consulting Physician (Hematology) Kyung Rudd, MD as Consulting Physician (Radiation Oncology) Jonnie Finner, RN as Oncology Nurse Navigator  Date of Service:  05/03/2020  CHIEF COMPLAINT: F/u of rectal cancer  SUMMARY OF ONCOLOGIC HISTORY: Oncology History Overview Note  Cancer Staging Rectal adenocarcinoma [facility]) Staging form: Colon and Rectum, AJCC 8th Edition - Clinical stage from 08/21/2019: Stage IIIB (cT3, cN1, cM0) - Signed by Truitt Merle, MD on 08/21/2019    Rectal adenocarcinoma ([place])  07/03/2019 Procedure   Colonoscopy by Dr. Collene Mares  IMPRESSION -Likely Malignant 5cm, circumferential, bleeding tumor in the rectum, 304 cm from the anal verge -biopsy done  -One 8 mm sessile polyp in the distal sigmoid colon, removed with a hot snare x2; resected and retrieved.  -Two small sessile polyps, 1 in the mid-descending colon and 1 in the mid transverse colon - removed by cold biopsies.  -One 35mm sessile polyp in the mid transverse colon, removed with a hot snare x1; resected and retrieved.  -One 39mm sessile polyp in the proximal ascending colon, removed with a hot snare X1; resected and retrieved.  -few large scattered diverticula.   07/03/2019 Initial Biopsy   FINAL DIAGNOSIS 07/03/19  A. Colon, Descending, Polyp, Polypectomy:   -TUBULAR ADENOMA   -No high grade dysplasia or malignancy.  B. Colon, transverse, polyp, polypectomy:   -TUBULAR ADENOMA  -No high grade dysplasia or malignancy. C. Colon, Ascending, Polyp, Polypectomy:   -Fragments of sessile serrated adenoma/polyp D. Colon, sigmoid, polyp, polypectomy:   -Fragments of Tubular Adenoma  -No  high grade dysplasia or malignancy. E. Rectum, Mass, Biopsy:   -INVASIVE COLONIC ADENOCARCINOMA, MODERATELY-DIFFERENTIATED, see comment     07/12/2019 Imaging   CT AP W Contrast 07/12/19  IMPRESSION: 1. No evidence of metastatic disease within the chest, abdomen or pelvis. 2. Possible mild wall thickening in the sigmoid colon, although no focal colonic mass is identified. There are few distal colonic diverticula. 3. Sequela of prior granulomatous disease. 4. Possible cholelithiasis with mild wall thickening of the gallbladder fundus. 5. Aortic Atherosclerosis (ICD10-I70.0).   08/21/2019 Initial Diagnosis   Rectal adenocarcinoma ([place])   08/21/2019 Cancer Staging   Staging form: Colon and Rectum, AJCC 8th Edition - Clinical stage from 08/21/2019: Stage IIIB (cT3, cN1, cM0) - Signed by Truitt Merle, MD on 08/21/2019   08/31/2019 - 12/07/2019 Chemotherapy   FOLFOX q2weeks starting 08/31/19. Removed 5FU bolus and increased steroids pre-meds due to thrombocytopenia and skin rash starting with C4. Completed 12/07/19    11/21/2019 Imaging   CT AP W contrast  IMPRESSION: 1. Stable exam. No evidence for metastatic disease within the abdomen or pelvis. 2. Splenomegaly.  New from previous exam. 3. Gallstones.   Aortic Atherosclerosis (ICD10-I70.0).   12/25/2019 - 02/01/2020 Chemotherapy   Concurrent chemoRT with Xeloda $RemoveBef'2000mg'jlIBsswwKc$  in the AM and $Remo'1500mg'BTIys$  in the PM on days of RT  12/25/19-7/15/2   12/25/2019 - 02/01/2020 Radiation Therapy   Concurrent  chemoRT with Dr Lisbeth Renshaw 12/25/19-02/01/20   04/03/2020 Surgery   XI ROBOTIC ASSISTED LOWER ANTERIOR RESECTION, SPLENIC FLEXURE MOBILIZATION, RIGID PROCTOSCOPY and DIVERTING LOOP ILEOSTOMY with Dr Malcolm Metro and Dr Johney Maine   04/03/2020 Pathology Results   FINAL MICROSCOPIC DIAGNOSIS:   A. RECTOSIGMOID COLON, LOW  ANTERIOR RESECTION:  - No residual invasive carcinoma status post neoadjuvant treatment.  - See oncology table.   B. FINAL DISTAL MARGIN, EXCISION:  -  Unremarkable colonic mucosa.       CURRENT THERAPY:  Surveillance   INTERVAL HISTORY:  Joshua Zhang is here for a follow up after surgery. She presents to the clinic alone. He notes he tolerated surgery well and has recovered well. He plans to have colostomy reversal week of 06/03/2020. He notes he is eating well and managed weight.     REVIEW OF SYSTEMS:   Constitutional: Denies fevers, chills or abnormal weight loss Eyes: Denies blurriness of vision Ears, nose, mouth, throat, and face: Denies mucositis or sore throat Respiratory: Denies cough, dyspnea or wheezes Cardiovascular: Denies palpitation, chest discomfort or lower extremity swelling Gastrointestinal:  Denies nausea, heartburn or change in bowel habits Skin: Denies abnormal skin rashes Lymphatics: Denies new lymphadenopathy or easy bruising Neurological:Denies numbness, tingling or new weaknesses Behavioral/Psych: Mood is stable, no new changes  All other systems were reviewed with the patient and are negative.  MEDICAL HISTORY:  Past Medical History:  Diagnosis Date  . HIV (human immunodeficiency virus infection) ([place])   . Hypertension   . rectal ca dx'd 06/2019    SURGICAL HISTORY: Past Surgical History:  Procedure Laterality Date  . APPENDECTOMY     49 years old   . DIVERTING ILEOSTOMY N/A 04/03/2020   Procedure: DIVERTING LOOP ILEOSTOMY;  Surgeon: Leighton Ruff, MD;  Location: [facility] ORS;  Service: General;  Laterality: N/A;  . FINGER SURGERY Right   . IR IMAGING GUIDED PORT INSERTION  08/30/2019  . SHOULDER SURGERY Bilateral 49 years old  . WISDOM TOOTH EXTRACTION    . XI ROBOTIC ASSISTED LOWER ANTERIOR RESECTION N/A 04/03/2020   Procedure: XI ROBOTIC ASSISTED LOWER ANTERIOR RESECTION, SPLENIC FLEXURE MOBILIZATION, RIGID PROCTOSCOPY;  Surgeon: Leighton Ruff, MD;  Location: [facility]S;  Service: General;  Laterality: N/A;    I have reviewed the social history and family history with the patient and they are  unchanged from previous note.  ALLERGIES:  has No Known Allergies.  MEDICATIONS:  Current Outpatient Medications  Medication Sig Dispense Refill  . amLODipine (NORVASC) 5 MG tablet Take 5 mg by mouth daily.     Marland Kitchen atorvastatin (LIPITOR) 10 MG tablet Take 10 mg by mouth daily.     Marland Kitchen BIKTARVY 50-200-25 MG TABS tablet TAKE 1 TABLET BY MOUTH ONCE DAILY (Patient taking differently: Take 1 tablet by mouth daily. ) 30 tablet 5  . lidocaine-prilocaine (EMLA) cream Apply to affected area once (Patient not taking: Reported on 12/19/2019) 30 g 3  . losartan (COZAAR) 25 MG tablet Take by mouth.    . Multiple Vitamin (MULTIVITAMIN) tablet Take 1 tablet by mouth daily.    . ondansetron (ZOFRAN) 8 MG tablet Take 1 tablet (8 mg total) by mouth 2 (two) times daily as needed for refractory nausea / vomiting. Start on day 3 after chemotherapy. (Patient not taking: Reported on 12/19/2019) 30 tablet 1  . prochlorperazine (COMPAZINE) 10 MG tablet Take 1 tablet (10 mg total) by mouth every 6 (six) hours as needed (Nausea or vomiting). (Patient not taking: Reported on 12/19/2019) 30 tablet 1  . traMADol (ULTRAM) 50 MG tablet Take 1-2 tablets (50-100 mg total) by mouth every 6 (six) hours as needed for moderate pain or severe pain. 20 tablet 0   No current facility-administered medications for this visit.    PHYSICAL EXAMINATION: ECOG PERFORMANCE STATUS: 0 -  Asymptomatic  Vitals:   05/03/20 0837  BP: 121/79  Pulse: 68  Resp: 17  Temp: (!) 97.3 F (36.3 C)  SpO2: 98%   Filed Weights   05/03/20 0837  Weight: 203 lb 6.4 oz (92.3 kg)    Due to COVID19 we will limit examination to appearance. Patient had no complaints.  GENERAL:alert, no distress and comfortable SKIN: skin color normal, no rashes or significant lesions EYES: normal, Conjunctiva are pink and non-injected, sclera clear  NEURO: alert & oriented x 3 with fluent speech   LABORATORY DATA:  I have reviewed the data as listed CBC Latest Ref Rng &  Units 05/03/2020 04/06/2020 04/05/2020  WBC 4.0 - 10.5 K/uL 4.1 4.8 5.2  Hemoglobin 13.0 - 17.0 g/dL 13.5 12.6(L) 11.4(L)  Hematocrit 39 - 52 % 36.6(L) 36.0(L) 33.0(L)  Platelets 150 - 400 K/uL 143(L) 133(L) 121(L)     CMP Latest Ref Rng & Units 05/03/2020 04/06/2020 04/05/2020  Glucose 70 - 99 mg/dL 99 94 102(H)  BUN 6 - 20 mg/dL $Remove'15 16 16  'hMHDRfV$ Creatinine 0.61 - 1.24 mg/dL 1.01 1.00 1.18  Sodium 135 - 145 mmol/L 140 139 139  Potassium 3.5 - 5.1 mmol/L 4.3 3.9 4.0  Chloride 98 - 111 mmol/L 109 107 108  CO2 22 - 32 mmol/L $RemoveB'24 24 25  'ofEvJfHL$ Calcium 8.9 - 10.3 mg/dL 9.4 9.2 8.3(L)  Total Protein 6.5 - 8.1 g/dL 7.3 - -  Total Bilirubin 0.3 - 1.2 mg/dL 1.2 - -  Alkaline Phos 38 - 126 U/L 177(H) - -  AST 15 - 41 U/L 68(H) - -  ALT 0 - 44 U/L 98(H) - -      RADIOGRAPHIC STUDIES: I have personally reviewed the radiological images as listed and agreed with the findings in the report. No results found.   ASSESSMENT & PLAN:  Joshua Zhang is a 49 y.o. male with    1.Low rectaladenocarcinoma,cT3N1M0,Stage IIIB, MMR normal, ypT0N0 -This was discovered on screening colonoscopyin 06/2019 with low rectum mass close to sphincter. Biopsy showed this is moderately-differentiated adenocarcinoma locally advanced. Based on MRI, this is cT3N1 stage III disease.  -He completed 8 cycles of total neoadjuvant chemotherapywithFOLFOX and concurrent chemoRTwith Xeloda.  -He underwent surgery with Dr Marcello Moores on 04/04/11. We discussed his surgical path which showed no residual invasive carcinoma and 17 Lymph nodes were negative. Overall he had complete response to chemo/RT. I do not plan to give adjuvant treatment. He has recovered well from prior treatment and surgery.  -I discussed his risk of cancer recurrence has significance decreased but still present. I reviewed the risk of cancer recurrence in the future. I discussed the surveillance plan, which is a physical exam and lab test (including CBC, CMP and CEA)  every 3 months for the first 2 years, then every 6-12 months, colonoscopy in one year, and surveillance CT scan every 6-12 month for up to 5 year.  -He will proceed with Colostomy reversal with Dr Marcello Moores week of 11/15.21. He can have PAC removed at that time.  -F/u in 3 months    2. HIV, controlled -He denies h/o or current use of recreational or IV drugs. He is unsure how he initially got infected.  -Controlled and undetectable. 09/15/18 CD4 was 350.His3/22/21 CD4 T cells is 708 -He is on Biktavy. This is managed by Dr. Megan Salon. -He has received both his COVID19 vaccines. I encouraged him to receive COVID booster   3. HLD, HTN -He will continue medications and f/u with PCP  4. Thrombocytopenia  -Secondary to chemo FOLFOX -s/p neo-adjuvant treatment starting to improve. 143K today (05/03/20)  5. Mild Transaminitis   -02/26/20 Labs shows AST 63, ALT 73, Alk Phos 149, Tbili 1.6  -Labs today show mild increase but mostly stable Transaminitis, AST 68, ALT 98, Alk Phos 177 with normal tbili. Will monitor.     PLAN: -Lab and F/u in 3 months  -he will proceed with colostomy reversal and port removal next month   No problem-specific Assessment & Plan notes found for this encounter.   No orders of the defined types were placed in this encounter.  All questions were answered. The patient knows to call the clinic with any problems, questions or concerns. No barriers to learning was detected. The total time spent in the appointment was 30 minutes.     Truitt Merle, MD 05/03/2020   I, Joslyn Devon, am acting as scribe for Truitt Merle, MD.   I have reviewed the above documentation for accuracy and completeness, and I agree with the above.

## 2020-05-03 ENCOUNTER — Telehealth: Payer: Self-pay | Admitting: Hematology

## 2020-05-03 ENCOUNTER — Inpatient Hospital Stay: Payer: BC Managed Care – PPO | Attending: Hematology

## 2020-05-03 ENCOUNTER — Other Ambulatory Visit: Payer: Self-pay

## 2020-05-03 ENCOUNTER — Inpatient Hospital Stay: Payer: BC Managed Care – PPO

## 2020-05-03 ENCOUNTER — Encounter: Payer: Self-pay | Admitting: Hematology

## 2020-05-03 ENCOUNTER — Inpatient Hospital Stay: Payer: BC Managed Care – PPO | Admitting: Hematology

## 2020-05-03 VITALS — BP 121/79 | HR 68 | Temp 97.3°F | Resp 17 | Ht 70.0 in | Wt 203.4 lb

## 2020-05-03 DIAGNOSIS — I700 Atherosclerosis of aorta: Secondary | ICD-10-CM | POA: Insufficient documentation

## 2020-05-03 DIAGNOSIS — Z9049 Acquired absence of other specified parts of digestive tract: Secondary | ICD-10-CM | POA: Insufficient documentation

## 2020-05-03 DIAGNOSIS — C20 Malignant neoplasm of rectum: Secondary | ICD-10-CM | POA: Diagnosis not present

## 2020-05-03 DIAGNOSIS — E785 Hyperlipidemia, unspecified: Secondary | ICD-10-CM | POA: Insufficient documentation

## 2020-05-03 DIAGNOSIS — B20 Human immunodeficiency virus [HIV] disease: Secondary | ICD-10-CM

## 2020-05-03 DIAGNOSIS — R161 Splenomegaly, not elsewhere classified: Secondary | ICD-10-CM | POA: Diagnosis not present

## 2020-05-03 DIAGNOSIS — D696 Thrombocytopenia, unspecified: Secondary | ICD-10-CM | POA: Diagnosis not present

## 2020-05-03 DIAGNOSIS — R7401 Elevation of levels of liver transaminase levels: Secondary | ICD-10-CM | POA: Diagnosis not present

## 2020-05-03 DIAGNOSIS — I10 Essential (primary) hypertension: Secondary | ICD-10-CM | POA: Diagnosis not present

## 2020-05-03 DIAGNOSIS — K635 Polyp of colon: Secondary | ICD-10-CM | POA: Insufficient documentation

## 2020-05-03 DIAGNOSIS — K5730 Diverticulosis of large intestine without perforation or abscess without bleeding: Secondary | ICD-10-CM | POA: Insufficient documentation

## 2020-05-03 DIAGNOSIS — Z79899 Other long term (current) drug therapy: Secondary | ICD-10-CM | POA: Diagnosis not present

## 2020-05-03 DIAGNOSIS — Z95828 Presence of other vascular implants and grafts: Secondary | ICD-10-CM

## 2020-05-03 DIAGNOSIS — Z21 Asymptomatic human immunodeficiency virus [HIV] infection status: Secondary | ICD-10-CM | POA: Insufficient documentation

## 2020-05-03 LAB — CBC WITH DIFFERENTIAL (CANCER CENTER ONLY)
Abs Immature Granulocytes: 0.02 10*3/uL (ref 0.00–0.07)
Basophils Absolute: 0 10*3/uL (ref 0.0–0.1)
Basophils Relative: 1 %
Eosinophils Absolute: 0.1 10*3/uL (ref 0.0–0.5)
Eosinophils Relative: 3 %
HCT: 36.6 % — ABNORMAL LOW (ref 39.0–52.0)
Hemoglobin: 13.5 g/dL (ref 13.0–17.0)
Immature Granulocytes: 1 %
Lymphocytes Relative: 18 %
Lymphs Abs: 0.8 10*3/uL (ref 0.7–4.0)
MCH: 36.1 pg — ABNORMAL HIGH (ref 26.0–34.0)
MCHC: 36.9 g/dL — ABNORMAL HIGH (ref 30.0–36.0)
MCV: 97.9 fL (ref 80.0–100.0)
Monocytes Absolute: 0.5 10*3/uL (ref 0.1–1.0)
Monocytes Relative: 11 %
Neutro Abs: 2.8 10*3/uL (ref 1.7–7.7)
Neutrophils Relative %: 66 %
Platelet Count: 143 10*3/uL — ABNORMAL LOW (ref 150–400)
RBC: 3.74 MIL/uL — ABNORMAL LOW (ref 4.22–5.81)
RDW: 11.9 % (ref 11.5–15.5)
WBC Count: 4.1 10*3/uL (ref 4.0–10.5)
nRBC: 0 % (ref 0.0–0.2)

## 2020-05-03 LAB — CMP (CANCER CENTER ONLY)
ALT: 98 U/L — ABNORMAL HIGH (ref 0–44)
AST: 68 U/L — ABNORMAL HIGH (ref 15–41)
Albumin: 4 g/dL (ref 3.5–5.0)
Alkaline Phosphatase: 177 U/L — ABNORMAL HIGH (ref 38–126)
Anion gap: 7 (ref 5–15)
BUN: 15 mg/dL (ref 6–20)
CO2: 24 mmol/L (ref 22–32)
Calcium: 9.4 mg/dL (ref 8.9–10.3)
Chloride: 109 mmol/L (ref 98–111)
Creatinine: 1.01 mg/dL (ref 0.61–1.24)
GFR, Estimated: >60 mL/min (ref >60)
Glucose, Bld: 99 mg/dL (ref 70–99)
Potassium: 4.3 mmol/L (ref 3.5–5.1)
Sodium: 140 mmol/L (ref 135–145)
Total Bilirubin: 1.2 mg/dL (ref 0.3–1.2)
Total Protein: 7.3 g/dL (ref 6.5–8.1)

## 2020-05-03 MED ORDER — HEPARIN SOD (PORK) LOCK FLUSH 100 UNIT/ML IV SOLN
500.0000 [IU] | Freq: Once | INTRAVENOUS | Status: AC | PRN
  Administered 2020-05-03: 500 [IU]
  Filled 2020-05-03: qty 5

## 2020-05-03 MED ORDER — SODIUM CHLORIDE 0.9% FLUSH
10.0000 mL | INTRAVENOUS | Status: DC | PRN
  Administered 2020-05-03: 10 mL
  Filled 2020-05-03: qty 10

## 2020-05-03 NOTE — Telephone Encounter (Signed)
Scheduled per 10/15 los. No avs or calendar needed to be printed. Pt is mychart active.

## 2020-05-03 NOTE — Patient Instructions (Signed)

## 2020-05-09 ENCOUNTER — Ambulatory Visit: Payer: Self-pay | Admitting: General Surgery

## 2020-05-14 ENCOUNTER — Ambulatory Visit: Payer: Self-pay | Admitting: General Surgery

## 2020-05-17 DIAGNOSIS — Z432 Encounter for attention to ileostomy: Secondary | ICD-10-CM | POA: Diagnosis not present

## 2020-05-27 ENCOUNTER — Ambulatory Visit: Payer: Self-pay | Admitting: General Surgery

## 2020-05-27 MED FILL — BIKTARVY 50-200-25 MG TABS: 50-200-25 | 30 days supply | Qty: 30 | Fill #5

## 2020-05-27 NOTE — H&P (View-Only) (Signed)
The patient is a 49 year old male who presents with colorectal cancer. 49 year old male who presented to my office with a new diagnosis of rectal cancer in late 2020. This was seen on screening colonoscopy. Patient has noticed rectal bleeding for the past 2 years. He denied any weight loss or changes in bowel habits. Colonoscopy revealed multiple polyps throughout the colon and a lesion in the distal rectum. Biopsies confirm adenocarcinoma within the rectal lesion. CT scans showed no signs of metastatic disease. MRI showed a distal rectal lesion which appeared to be abutting the internal sphincter complex. CEA was 2.5. He has underwent total neoadjuvant chemoradiation treatment. He completed his final dose of radiation on July 15. He then underwent an uncomplicated robotic-assisted low anterior resection with coloanal anastomosis and diverting ileostomy on April 03, 2020. He was discharged home on postop day 3. He has been doing well at home since then. He is back to work and normal activities. He is here for evaluation for loop ileostomy closure.   Problem List/Past Medical Leighton Ruff, MD; 16/07/958 1:34 PM) RECTAL CANCER (C20)  Past Surgical History Leighton Ruff, MD; 45/10/979 1:34 PM) Colon Polyp Removal - Colonoscopy Shoulder Surgery Left.  Diagnostic Studies History Leighton Ruff, MD; 19/07/4780 1:34 PM) Colonoscopy within last year  Allergies Mammie Lorenzo, LPN; 95/12/2128 8:65 PM) No Known Drug Allergies [08/15/2019]: Allergies Reconciled  Medication History Mammie Lorenzo, LPN; 78/10/6960 9:52 PM) amLODIPine Besy-Benazepril HCl (10-20MG  Capsule, Oral) Active. Atorvastatin Calcium (10MG  Tablet, Oral) Active. Biktarvy (50-200-25MG  Tablet, Oral) Active. Medications Reconciled  Social History Leighton Ruff, MD; 84/07/3242 1:34 PM) Alcohol use Moderate alcohol use. Caffeine use Coffee. No drug use Tobacco use Former smoker.  Family History  Leighton Ruff, MD; 01/0/2725 1:34 PM) Breast Cancer Mother. Heart Disease Father.  Other Problems Leighton Ruff, MD; 36/12/4401 1:34 PM) High blood pressure HIV-positive Hypercholesterolemia     Review of Systems Leighton Ruff MD; 47/10/2593 1:34 PM) General Present- Fatigue and Weight Gain. Not Present- Appetite Loss, Chills, Fever, Night Sweats and Weight Loss. Skin Present- Dryness. Not Present- Change in Wart/Mole, Hives, Jaundice, New Lesions, Non-Healing Wounds, Rash and Ulcer. HEENT Present- Seasonal Allergies. Not Present- Earache, Hearing Loss, Hoarseness, Nose Bleed, Oral Ulcers, Ringing in the Ears, Sinus Pain, Sore Throat, Visual Disturbances, Wears glasses/contact lenses and Yellow Eyes. Respiratory Not Present- Bloody sputum, Chronic Cough, Difficulty Breathing, Snoring and Wheezing. Breast Not Present- Breast Mass, Breast Pain, Nipple Discharge and Skin Changes. Cardiovascular Not Present- Chest Pain, Difficulty Breathing Lying Down, Leg Cramps, Palpitations, Rapid Heart Rate, Shortness of Breath and Swelling of Extremities. Gastrointestinal Present- Bloody Stool, Change in Bowel Habits and Chronic diarrhea. Not Present- Abdominal Pain, Bloating, Constipation, Difficulty Swallowing, Excessive gas, Gets full quickly at meals, Hemorrhoids, Indigestion, Nausea, Rectal Pain and Vomiting. Male Genitourinary Not Present- Blood in Urine, Change in Urinary Stream, Frequency, Impotence, Nocturia, Painful Urination, Urgency and Urine Leakage. Musculoskeletal Present- Back Pain. Not Present- Joint Pain, Joint Stiffness, Muscle Pain, Muscle Weakness and Swelling of Extremities. Neurological Not Present- Decreased Memory, Fainting, Headaches, Numbness, Seizures, Tingling, Tremor, Trouble walking and Weakness. Psychiatric Not Present- Anxiety, Bipolar, Change in Sleep Pattern, Depression, Fearful and Frequent crying. Endocrine Not Present- Cold Intolerance, Excessive Hunger, Hair  Changes, Heat Intolerance and New Diabetes. Hematology Present- HIV. Not Present- Blood Thinners, Easy Bruising, Excessive bleeding, Gland problems and Persistent Infections.  Vitals Claiborne Billings Dockery LPN; 63/02/7563 3:32 PM) 05/27/2020 1:25 PM Weight: 207.2 lb Height: 70in Body Surface Area: 2.12 m Body Mass Index: 29.73 kg/m  Temp.: 98.90F(Infrared)  Pulse: 99 (Regular)  BP: 126/84(Sitting, Right Arm, Standard)        Physical Exam Leighton Ruff MD; 07/27/968 1:34 PM)  General Mental Status-Alert. General Appearance-Cooperative.  Abdomen Palpation/Percussion Palpation and Percussion of the abdomen reveal - Soft and Non Tender.  Rectal Anorectal Exam External - normal external exam. Internal - normal sphincter tone. Note: anastomosis healed.    Assessment & Plan Leighton Ruff MD; 44/03/2523 1:35 PM)  RECTAL CANCER (C20) Impression: 49 year old male status post low anterior resection, diverting ileostomy. He has healed appropriately and is ready for ostomy reversal. We discussed the risk of hernia, and wound complications of ileostomy reversal. I briefly discussed postoperative bowel movements and the changes that would entail with his rectum removed. All questions were answered.

## 2020-05-27 NOTE — H&P (Signed)
The patient is a 50 year old male who presents with colorectal cancer. 49 year old male who presented to my office with a new diagnosis of rectal cancer in late 2020. This was seen on screening colonoscopy. Patient has noticed rectal bleeding for the past 2 years. He denied any weight loss or changes in bowel habits. Colonoscopy revealed multiple polyps throughout the colon and a lesion in the distal rectum. Biopsies confirm adenocarcinoma within the rectal lesion. CT scans showed no signs of metastatic disease. MRI showed a distal rectal lesion which appeared to be abutting the internal sphincter complex. CEA was 2.5. He has underwent total neoadjuvant chemoradiation treatment. He completed his final dose of radiation on July 15. He then underwent an uncomplicated robotic-assisted low anterior resection with coloanal anastomosis and diverting ileostomy on April 03, 2020. He was discharged home on postop day 3. He has been doing well at home since then. He is back to work and normal activities. He is here for evaluation for loop ileostomy closure.   Problem List/Past Medical Leighton Ruff, MD; 14/01/8294 1:34 PM) RECTAL CANCER (C20)  Past Surgical History Leighton Ruff, MD; 62/07/3084 1:34 PM) Colon Polyp Removal - Colonoscopy Shoulder Surgery Left.  Diagnostic Studies History Leighton Ruff, MD; 57/02/4695 1:34 PM) Colonoscopy within last year  Allergies Mammie Lorenzo, LPN; 29/11/2839 3:24 PM) No Known Drug Allergies [08/15/2019]: Allergies Reconciled  Medication History Mammie Lorenzo, LPN; 40/07/270 5:36 PM) amLODIPine Besy-Benazepril HCl (10-20MG  Capsule, Oral) Active. Atorvastatin Calcium (10MG  Tablet, Oral) Active. Biktarvy (50-200-25MG  Tablet, Oral) Active. Medications Reconciled  Social History Leighton Ruff, MD; 64/10/345 1:34 PM) Alcohol use Moderate alcohol use. Caffeine use Coffee. No drug use Tobacco use Former smoker.  Family History  Leighton Ruff, MD; 42/11/9561 1:34 PM) Breast Cancer Mother. Heart Disease Father.  Other Problems Leighton Ruff, MD; 87/11/6431 1:34 PM) High blood pressure HIV-positive Hypercholesterolemia     Review of Systems Leighton Ruff MD; 29/11/1882 1:34 PM) General Present- Fatigue and Weight Gain. Not Present- Appetite Loss, Chills, Fever, Night Sweats and Weight Loss. Skin Present- Dryness. Not Present- Change in Wart/Mole, Hives, Jaundice, New Lesions, Non-Healing Wounds, Rash and Ulcer. HEENT Present- Seasonal Allergies. Not Present- Earache, Hearing Loss, Hoarseness, Nose Bleed, Oral Ulcers, Ringing in the Ears, Sinus Pain, Sore Throat, Visual Disturbances, Wears glasses/contact lenses and Yellow Eyes. Respiratory Not Present- Bloody sputum, Chronic Cough, Difficulty Breathing, Snoring and Wheezing. Breast Not Present- Breast Mass, Breast Pain, Nipple Discharge and Skin Changes. Cardiovascular Not Present- Chest Pain, Difficulty Breathing Lying Down, Leg Cramps, Palpitations, Rapid Heart Rate, Shortness of Breath and Swelling of Extremities. Gastrointestinal Present- Bloody Stool, Change in Bowel Habits and Chronic diarrhea. Not Present- Abdominal Pain, Bloating, Constipation, Difficulty Swallowing, Excessive gas, Gets full quickly at meals, Hemorrhoids, Indigestion, Nausea, Rectal Pain and Vomiting. Male Genitourinary Not Present- Blood in Urine, Change in Urinary Stream, Frequency, Impotence, Nocturia, Painful Urination, Urgency and Urine Leakage. Musculoskeletal Present- Back Pain. Not Present- Joint Pain, Joint Stiffness, Muscle Pain, Muscle Weakness and Swelling of Extremities. Neurological Not Present- Decreased Memory, Fainting, Headaches, Numbness, Seizures, Tingling, Tremor, Trouble walking and Weakness. Psychiatric Not Present- Anxiety, Bipolar, Change in Sleep Pattern, Depression, Fearful and Frequent crying. Endocrine Not Present- Cold Intolerance, Excessive Hunger, Hair  Changes, Heat Intolerance and New Diabetes. Hematology Present- HIV. Not Present- Blood Thinners, Easy Bruising, Excessive bleeding, Gland problems and Persistent Infections.  Vitals Claiborne Billings Dockery LPN; 16/12/628 1:60 PM) 05/27/2020 1:25 PM Weight: 207.2 lb Height: 70in Body Surface Area: 2.12 m Body Mass Index: 29.73 kg/m  Temp.: 98.62F(Infrared)  Pulse: 99 (Regular)  BP: 126/84(Sitting, Right Arm, Standard)        Physical Exam Leighton Ruff MD; 62/02/3661 1:34 PM)  General Mental Status-Alert. General Appearance-Cooperative.  Abdomen Palpation/Percussion Palpation and Percussion of the abdomen reveal - Soft and Non Tender.  Rectal Anorectal Exam External - normal external exam. Internal - normal sphincter tone. Note: anastomosis healed.    Assessment & Plan Leighton Ruff MD; 94/01/6545 1:35 PM)  RECTAL CANCER (C20) Impression: 49 year old male status post low anterior resection, diverting ileostomy. He has healed appropriately and is ready for ostomy reversal. We discussed the risk of hernia, and wound complications of ileostomy reversal. I briefly discussed postoperative bowel movements and the changes that would entail with his rectum removed. All questions were answered.

## 2020-05-31 NOTE — Progress Notes (Signed)
DUE TO COVID-19 ONLY ONE VISITOR IS ALLOWED TO COME WITH YOU AND STAY IN THE WAITING ROOM ONLY DURING PRE OP AND PROCEDURE DAY OF SURGERY. THE 1 VISITOR  MAY VISIT WITH YOU AFTER SURGERY IN YOUR PRIVATE ROOM DURING VISITING HOURS ONLY!  YOU NEED TO HAVE A COVID 19 TEST ON_______ @_______ , THIS TEST MUST BE DONE BEFORE SURGERY,  COVID TESTING SITE [address] [place] [address], IT IS ON THE RIGHT GOING OUT [address] APPROXIMATELY  2 MINUTES PAST [employer] ON THE RIGHT. ONCE YOUR COVID TEST IS COMPLETED,  PLEASE BEGIN THE QUARANTINE INSTRUCTIONS AS OUTLINED IN YOUR HANDOUT.                MAKENA MCGRADY  05/31/2020   Your procedure is scheduled on: 06/12/2020    Report to [facility] Main  Entrance   Report to admitting a  0630 AM     Call this number if you have problems the morning of surgery [phone]    Remember: Do not eat food , candy gum or mints :After Midnight. You may have clear liquids from midnight until0530 am      CLEAR LIQUID DIET   Foods Allowed                                                                       Coffee and tea, regular and decaf                              Plain Jell-O any favor except red or purple                                            Fruit ices (not with fruit pulp)                                      Iced Popsicles                                     Carbonated beverages, regular and diet                                    Cranberry, grape and apple juices Sports drinks like Gatorade Lightly seasoned clear broth or consume(fat free) Sugar, honey syrup   _____________________________________________________________________    BRUSH YOUR TEETH MORNING OF SURGERY AND RINSE YOUR MOUTH OUT, NO CHEWING GUM CANDY OR MINTS.     Take these medicines the morning of surgery with A SIP OF WATER: amlodipine   DO NOT TAKE ANY DIABETIC MEDICATIONS DAY OF YOUR SURGERY                                You may not have any metal on your body including  hair pins and              piercings  Do not wear jewelry, make-up, lotions, powders or perfumes, deodorant             Do not wear nail polish on your fingernails.  Do not shave  48 hours prior to surgery.              Men may shave face and neck.   Do not bring valuables to the hospital. [place].  Contacts, dentures or bridgework may not be worn into surgery.  Leave suitcase in the car. After surgery it may be brought to your room.     Patients discharged the day of surgery will not be allowed to drive home. IF YOU ARE HAVING SURGERY AND GOING HOME THE SAME DAY, YOU MUST HAVE AN ADULT TO DRIVE YOU HOME AND BE WITH YOU FOR 24 HOURS. YOU MAY GO HOME BY TAXI OR UBER OR ORTHERWISE, BUT AN ADULT MUST ACCOMPANY YOU HOME AND STAY WITH YOU FOR 24 HOURS.  Name and phone number of your driver:  Special Instructions: N/A              Please read over the following fact sheets you were given: _____________________________________________________________________  [facility] - Preparing for Surgery Before surgery, you can play an important role.  Because skin is not sterile, your skin needs to be as free of germs as possible.  You can reduce the number of germs on your skin by washing with CHG (chlorahexidine gluconate) soap before surgery.  CHG is an antiseptic cleaner which kills germs and bonds with the skin to continue killing germs even after washing. Please DO NOT use if you have an allergy to CHG or antibacterial soaps.  If your skin becomes reddened/irritated stop using the CHG and inform your nurse when you arrive at Short Stay. Do not shave (including legs and underarms) for at least 48 hours prior to the first CHG shower.  You may shave your face/neck. Please follow these instructions carefully:  1.  Shower with CHG Soap the night before surgery and the  morning of Surgery.  2.  If you choose  to wash your hair, wash your hair first as usual with your  normal  shampoo.  3.  After you shampoo, rinse your hair and body thoroughly to remove the  shampoo.                           4.  Use CHG as you would any other liquid soap.  You can apply chg directly  to the skin and wash                       Gently with a scrungie or clean washcloth.  5.  Apply the CHG Soap to your body ONLY FROM THE NECK DOWN.   Do not use on face/ open                           Wound or open sores. Avoid contact with eyes, ears mouth and genitals (private parts).                       Wash face,  Genitals (private parts) with  your normal soap.             6.  Wash thoroughly, paying special attention to the area where your surgery  will be performed.  7.  Thoroughly rinse your body with warm water from the neck down.  8.  DO NOT shower/wash with your normal soap after using and rinsing off  the CHG Soap.                9.  Pat yourself dry with a clean towel.            10.  Wear clean pajamas.            11.  Place clean sheets on your bed the night of your first shower and do not  sleep with pets. Day of Surgery : Do not apply any lotions/deodorants the morning of surgery.  Please wear clean clothes to the hospital/surgery center.  FAILURE TO FOLLOW THESE INSTRUCTIONS MAY RESULT IN THE CANCELLATION OF YOUR SURGERY PATIENT SIGNATURE_________________________________  NURSE SIGNATURE__________________________________  ________________________________________________________________________

## 2020-06-04 ENCOUNTER — Encounter (HOSPITAL_COMMUNITY)
Admission: RE | Admit: 2020-06-04 | Discharge: 2020-06-04 | Disposition: A | Discharge status: Home / Self Care | Payer: BC Managed Care – PPO | Source: Ambulatory Visit | Attending: General Surgery | Admitting: General Surgery

## 2020-06-04 ENCOUNTER — Other Ambulatory Visit: Payer: Self-pay

## 2020-06-04 ENCOUNTER — Encounter (HOSPITAL_COMMUNITY): Payer: Self-pay

## 2020-06-04 DIAGNOSIS — Z01812 Encounter for preprocedural laboratory examination: Secondary | ICD-10-CM | POA: Diagnosis not present

## 2020-06-04 HISTORY — DX: Polyneuropathy, unspecified: G62.9

## 2020-06-04 HISTORY — DX: Personal history of antineoplastic chemotherapy: Z92.21

## 2020-06-04 LAB — BASIC METABOLIC PANEL
Anion gap: 8 (ref 5–15)
BUN: 17 mg/dL (ref 6–20)
CO2: 23 mmol/L (ref 22–32)
Calcium: 9.4 mg/dL (ref 8.9–10.3)
Chloride: 108 mmol/L (ref 98–111)
Creatinine, Ser: 1.04 mg/dL (ref 0.61–1.24)
GFR, Estimated: >60 mL/min (ref >60)
Glucose, Bld: 106 mg/dL — ABNORMAL HIGH (ref 70–99)
Potassium: 4.3 mmol/L (ref 3.5–5.1)
Sodium: 139 mmol/L (ref 135–145)

## 2020-06-04 LAB — CBC
HCT: 40.4 % (ref 39.0–52.0)
Hemoglobin: 14.5 g/dL (ref 13.0–17.0)
MCH: 35.9 pg — ABNORMAL HIGH (ref 26.0–34.0)
MCHC: 35.9 g/dL (ref 30.0–36.0)
MCV: 100 fL (ref 80.0–100.0)
Platelets: 174 10*3/uL (ref 150–400)
RBC: 4.04 MIL/uL — ABNORMAL LOW (ref 4.22–5.81)
RDW: 12.6 % (ref 11.5–15.5)
WBC: 5 10*3/uL (ref 4.0–10.5)
nRBC: 0 % (ref 0.0–0.2)

## 2020-06-07 ENCOUNTER — Other Ambulatory Visit: Payer: BC Managed Care – PPO

## 2020-06-07 ENCOUNTER — Other Ambulatory Visit: Payer: Self-pay

## 2020-06-07 DIAGNOSIS — B20 Human immunodeficiency virus [HIV] disease: Secondary | ICD-10-CM | POA: Diagnosis not present

## 2020-06-07 LAB — T-HELPER CELL (CD4) - (RCID CLINIC ONLY)
CD4 % Helper T Cell: 25 % — ABNORMAL LOW (ref 33–65)
CD4 T Cell Abs: 294 /uL — ABNORMAL LOW (ref 400–1790)

## 2020-06-08 ENCOUNTER — Other Ambulatory Visit (HOSPITAL_COMMUNITY)
Admission: RE | Admit: 2020-06-08 | Discharge: 2020-06-08 | Disposition: A | Discharge status: Home / Self Care | Payer: BC Managed Care – PPO | Source: Ambulatory Visit | Attending: General Surgery | Admitting: General Surgery

## 2020-06-08 DIAGNOSIS — Z20822 Contact with and (suspected) exposure to covid-19: Secondary | ICD-10-CM | POA: Insufficient documentation

## 2020-06-08 DIAGNOSIS — Z01812 Encounter for preprocedural laboratory examination: Secondary | ICD-10-CM | POA: Insufficient documentation

## 2020-06-08 LAB — SARS CORONAVIRUS 2 (TAT 6-24 HRS): SARS Coronavirus 2: NEGATIVE

## 2020-06-10 LAB — HIV-1 RNA QUANT-NO REFLEX-BLD
HIV 1 RNA Quant: <20 Copies/mL — ABNORMAL HIGH
HIV-1 RNA Quant, Log: <1.3 Log cps/mL — ABNORMAL HIGH

## 2020-06-11 ENCOUNTER — Other Ambulatory Visit: Payer: BC Managed Care – PPO

## 2020-06-11 ENCOUNTER — Encounter (HOSPITAL_COMMUNITY): Payer: Self-pay | Admitting: General Surgery

## 2020-06-11 NOTE — Anesthesia Preprocedure Evaluation (Signed)
Anesthesia Evaluation  Patient identified by MRN, date of birth, ID band Patient awake    Reviewed: Allergy & Precautions, NPO status , Patient's Chart, lab work & pertinent test results  History of Anesthesia Complications Negative for: history of anesthetic complications  Airway Mallampati: II  TM Distance: >3 FB Neck ROM: Full    Dental no notable dental hx.    Pulmonary neg pulmonary ROS, former smoker,    Pulmonary exam normal        Cardiovascular hypertension, Pt. 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    GI/Hepatic Neg liver ROS, Rectal cancer   Endo/Other  negative endocrine ROS  Renal/GU negative Renal ROS     Musculoskeletal negative musculoskeletal ROS (+)   Abdominal   Peds  Hematology  (+) HIV,   Anesthesia Other Findings Rectal cancer  Reproductive/Obstetrics                             Anesthesia Physical  Anesthesia Plan  ASA: III  Anesthesia Plan: General   Post-op Pain Management:    Induction: Intravenous  PONV Risk Score and Plan: 4 or greater and Ondansetron, Dexamethasone, Midazolam and Scopolamine patch - Pre-op  Airway Management Planned: Oral ETT  Additional Equipment:   Intra-op Plan:   Post-operative Plan: Extubation in OR  Informed Consent:   Plan Discussed with:   Anesthesia Plan Comments:         Anesthesia Quick Evaluation

## 2020-06-12 ENCOUNTER — Inpatient Hospital Stay (HOSPITAL_COMMUNITY)
Admission: RE | Admit: 2020-06-12 | Discharge: 2020-06-17 | DRG: 330 | Disposition: A | Discharge status: Home / Self Care | Payer: BC Managed Care – PPO | Attending: General Surgery | Admitting: General Surgery

## 2020-06-12 ENCOUNTER — Ambulatory Visit (HOSPITAL_COMMUNITY): Payer: BC Managed Care – PPO | Admitting: Anesthesiology

## 2020-06-12 ENCOUNTER — Encounter (HOSPITAL_COMMUNITY): Payer: Self-pay | Admitting: General Surgery

## 2020-06-12 ENCOUNTER — Encounter (HOSPITAL_COMMUNITY)
Admission: RE | Disposition: A | Discharge status: Home / Self Care | Payer: Self-pay | Source: Home / Self Care | Attending: General Surgery

## 2020-06-12 ENCOUNTER — Other Ambulatory Visit: Payer: Self-pay

## 2020-06-12 DIAGNOSIS — N289 Disorder of kidney and ureter, unspecified: Secondary | ICD-10-CM

## 2020-06-12 DIAGNOSIS — R739 Hyperglycemia, unspecified: Secondary | ICD-10-CM

## 2020-06-12 DIAGNOSIS — B20 Human immunodeficiency virus [HIV] disease: Secondary | ICD-10-CM | POA: Diagnosis not present

## 2020-06-12 DIAGNOSIS — Z9221 Personal history of antineoplastic chemotherapy: Secondary | ICD-10-CM | POA: Diagnosis not present

## 2020-06-12 DIAGNOSIS — Z932 Ileostomy status: Secondary | ICD-10-CM

## 2020-06-12 DIAGNOSIS — Z452 Encounter for adjustment and management of vascular access device: Secondary | ICD-10-CM | POA: Diagnosis not present

## 2020-06-12 DIAGNOSIS — K567 Ileus, unspecified: Secondary | ICD-10-CM | POA: Diagnosis not present

## 2020-06-12 DIAGNOSIS — I10 Essential (primary) hypertension: Secondary | ICD-10-CM | POA: Diagnosis not present

## 2020-06-12 DIAGNOSIS — M160 Bilateral primary osteoarthritis of hip: Secondary | ICD-10-CM | POA: Diagnosis not present

## 2020-06-12 DIAGNOSIS — R140 Abdominal distension (gaseous): Secondary | ICD-10-CM | POA: Diagnosis not present

## 2020-06-12 DIAGNOSIS — G629 Polyneuropathy, unspecified: Secondary | ICD-10-CM | POA: Diagnosis not present

## 2020-06-12 DIAGNOSIS — C20 Malignant neoplasm of rectum: Secondary | ICD-10-CM | POA: Diagnosis present

## 2020-06-12 DIAGNOSIS — Z20822 Contact with and (suspected) exposure to covid-19: Secondary | ICD-10-CM | POA: Diagnosis present

## 2020-06-12 DIAGNOSIS — M47816 Spondylosis without myelopathy or radiculopathy, lumbar region: Secondary | ICD-10-CM | POA: Diagnosis not present

## 2020-06-12 DIAGNOSIS — K660 Peritoneal adhesions (postprocedural) (postinfection): Secondary | ICD-10-CM | POA: Diagnosis not present

## 2020-06-12 DIAGNOSIS — T451X5A Adverse effect of antineoplastic and immunosuppressive drugs, initial encounter: Secondary | ICD-10-CM | POA: Diagnosis not present

## 2020-06-12 DIAGNOSIS — I878 Other specified disorders of veins: Secondary | ICD-10-CM | POA: Diagnosis not present

## 2020-06-12 DIAGNOSIS — Z87891 Personal history of nicotine dependence: Secondary | ICD-10-CM

## 2020-06-12 DIAGNOSIS — Z803 Family history of malignant neoplasm of breast: Secondary | ICD-10-CM

## 2020-06-12 DIAGNOSIS — Z432 Encounter for attention to ileostomy: Secondary | ICD-10-CM | POA: Diagnosis not present

## 2020-06-12 DIAGNOSIS — Z9889 Other specified postprocedural states: Secondary | ICD-10-CM

## 2020-06-12 DIAGNOSIS — R7301 Impaired fasting glucose: Secondary | ICD-10-CM | POA: Diagnosis not present

## 2020-06-12 HISTORY — PX: PORT-A-CATH REMOVAL: SHX5289

## 2020-06-12 HISTORY — PX: ILEOSTOMY CLOSURE: SHX1784

## 2020-06-12 SURGERY — CLOSURE, ILEOSTOMY
Anesthesia: General | Site: Chest | Laterality: Right

## 2020-06-12 MED ORDER — SCOPOLAMINE 1 MG/3DAYS TD PT72
1.0000 | MEDICATED_PATCH | TRANSDERMAL | Status: DC
  Administered 2020-06-12: 1.5 mg via TRANSDERMAL
  Filled 2020-06-12: qty 1

## 2020-06-12 MED ORDER — LIDOCAINE HCL (PF) 2 % IJ SOLN
INTRAMUSCULAR | Status: AC
  Filled 2020-06-12: qty 5

## 2020-06-12 MED ORDER — ONDANSETRON HCL 4 MG/2ML IJ SOLN
4.0000 mg | Freq: Four times a day (QID) | INTRAMUSCULAR | Status: DC | PRN
  Administered 2020-06-14: 4 mg via INTRAVENOUS
  Filled 2020-06-12: qty 2

## 2020-06-12 MED ORDER — SACCHAROMYCES BOULARDII 250 MG PO CAPS
250.0000 mg | ORAL_CAPSULE | Freq: Two times a day (BID) | ORAL | Status: DC
  Administered 2020-06-12 – 2020-06-16 (×9): 250 mg via ORAL
  Filled 2020-06-12 (×9): qty 1

## 2020-06-12 MED ORDER — ALVIMOPAN 12 MG PO CAPS
12.0000 mg | ORAL_CAPSULE | ORAL | Status: AC
  Administered 2020-06-12: 12 mg via ORAL
  Filled 2020-06-12: qty 1

## 2020-06-12 MED ORDER — DEXAMETHASONE SODIUM PHOSPHATE 10 MG/ML IJ SOLN
INTRAMUSCULAR | Status: AC
  Filled 2020-06-12: qty 1

## 2020-06-12 MED ORDER — ENSURE SURGERY PO LIQD
237.0000 mL | ORAL | Status: DC
  Administered 2020-06-12 – 2020-06-16 (×6): 237 mL via ORAL

## 2020-06-12 MED ORDER — DEXMEDETOMIDINE (PRECEDEX) IN NS 20 MCG/5ML (4 MCG/ML) IV SYRINGE
PREFILLED_SYRINGE | INTRAVENOUS | Status: DC | PRN
  Administered 2020-06-12 (×5): 4 ug via INTRAVENOUS

## 2020-06-12 MED ORDER — PROMETHAZINE HCL 25 MG/ML IJ SOLN: 6.2500 mg | INTRAMUSCULAR | Status: DC | PRN

## 2020-06-12 MED ORDER — ONDANSETRON HCL 4 MG/2ML IJ SOLN
INTRAMUSCULAR | Status: AC
  Filled 2020-06-12: qty 2

## 2020-06-12 MED ORDER — 0.9 % SODIUM CHLORIDE (POUR BTL) OPTIME
TOPICAL | Status: DC | PRN
  Administered 2020-06-12: 2000 mL

## 2020-06-12 MED ORDER — MIDAZOLAM HCL 2 MG/2ML IJ SOLN
INTRAMUSCULAR | Status: DC | PRN
  Administered 2020-06-12: 2 mg via INTRAVENOUS

## 2020-06-12 MED ORDER — HYDROMORPHONE HCL 1 MG/ML IJ SOLN
INTRAMUSCULAR | Status: AC
  Filled 2020-06-12: qty 1

## 2020-06-12 MED ORDER — ALUM & MAG HYDROXIDE-SIMETH 200-200-20 MG/5ML PO SUSP
30.0000 mL | Freq: Four times a day (QID) | ORAL | Status: DC | PRN
  Administered 2020-06-16: 30 mL via ORAL
  Filled 2020-06-12: qty 30

## 2020-06-12 MED ORDER — BICTEGRAVIR-EMTRICITAB-TENOFOV 50-200-25 MG PO TABS
1.0000 | ORAL_TABLET | Freq: Every day | ORAL | Status: DC
  Administered 2020-06-13 – 2020-06-17 (×5): 1 via ORAL
  Filled 2020-06-12 (×5): qty 1

## 2020-06-12 MED ORDER — PROPOFOL 10 MG/ML IV BOLUS
INTRAVENOUS | Status: AC
  Filled 2020-06-12: qty 40

## 2020-06-12 MED ORDER — AMLODIPINE BESYLATE 5 MG PO TABS
5.0000 mg | ORAL_TABLET | Freq: Every day | ORAL | Status: DC
  Administered 2020-06-13 – 2020-06-17 (×5): 5 mg via ORAL
  Filled 2020-06-12 (×5): qty 1

## 2020-06-12 MED ORDER — HYDROMORPHONE HCL 1 MG/ML IJ SOLN
0.5000 mg | INTRAMUSCULAR | Status: DC | PRN
  Administered 2020-06-12 – 2020-06-13 (×2): 0.5 mg via INTRAVENOUS
  Filled 2020-06-12 (×2): qty 0.5

## 2020-06-12 MED ORDER — ENOXAPARIN SODIUM 40 MG/0.4ML ~~LOC~~ SOLN
40.0000 mg | SUBCUTANEOUS | Status: DC
  Administered 2020-06-13 – 2020-06-17 (×5): 40 mg via SUBCUTANEOUS
  Filled 2020-06-12 (×5): qty 0.4

## 2020-06-12 MED ORDER — CHLORHEXIDINE GLUCONATE 0.12 % MT SOLN
15.0000 mL | Freq: Once | OROMUCOSAL | Status: AC
  Administered 2020-06-12: 15 mL via OROMUCOSAL

## 2020-06-12 MED ORDER — PROPOFOL 10 MG/ML IV BOLUS
INTRAVENOUS | Status: DC | PRN
  Administered 2020-06-12: 200 mg via INTRAVENOUS

## 2020-06-12 MED ORDER — LACTATED RINGERS IV SOLN: INTRAVENOUS | Status: DC

## 2020-06-12 MED ORDER — HYDROMORPHONE HCL 1 MG/ML IJ SOLN
0.2500 mg | INTRAMUSCULAR | Status: DC | PRN
  Administered 2020-06-12 (×4): 0.5 mg via INTRAVENOUS

## 2020-06-12 MED ORDER — FENTANYL CITRATE (PF) 250 MCG/5ML IJ SOLN
INTRAMUSCULAR | Status: DC | PRN
  Administered 2020-06-12: 50 ug via INTRAVENOUS
  Administered 2020-06-12 (×2): 100 ug via INTRAVENOUS
  Administered 2020-06-12 (×4): 50 ug via INTRAVENOUS

## 2020-06-12 MED ORDER — ACETAMINOPHEN 325 MG PO TABS: 650.0000 mg | ORAL_TABLET | Freq: Four times a day (QID) | ORAL | Status: DC | PRN

## 2020-06-12 MED ORDER — ALVIMOPAN 12 MG PO CAPS
12.0000 mg | ORAL_CAPSULE | Freq: Two times a day (BID) | ORAL | Status: DC
  Administered 2020-06-13 – 2020-06-15 (×5): 12 mg via ORAL
  Filled 2020-06-12 (×6): qty 1

## 2020-06-12 MED ORDER — ONDANSETRON HCL 4 MG PO TABS
4.0000 mg | ORAL_TABLET | Freq: Four times a day (QID) | ORAL | Status: DC | PRN
  Administered 2020-06-14: 4 mg via ORAL
  Filled 2020-06-12: qty 1

## 2020-06-12 MED ORDER — SIMETHICONE 80 MG PO CHEW
40.0000 mg | CHEWABLE_TABLET | Freq: Four times a day (QID) | ORAL | Status: DC | PRN
  Administered 2020-06-13 – 2020-06-14 (×2): 40 mg via ORAL
  Filled 2020-06-12 (×2): qty 1

## 2020-06-12 MED ORDER — SODIUM CHLORIDE 0.9 % IV SOLN
2.0000 g | INTRAVENOUS | Status: AC
  Administered 2020-06-12: 2 g via INTRAVENOUS
  Filled 2020-06-12: qty 2

## 2020-06-12 MED ORDER — ROCURONIUM BROMIDE 10 MG/ML (PF) SYRINGE
PREFILLED_SYRINGE | INTRAVENOUS | Status: AC
  Filled 2020-06-12: qty 10

## 2020-06-12 MED ORDER — FENTANYL CITRATE (PF) 250 MCG/5ML IJ SOLN
INTRAMUSCULAR | Status: AC
  Filled 2020-06-12: qty 5

## 2020-06-12 MED ORDER — SODIUM CHLORIDE 0.9 % IV SOLN
2.0000 g | Freq: Two times a day (BID) | INTRAVENOUS | Status: AC
  Administered 2020-06-12: 2 g via INTRAVENOUS
  Filled 2020-06-12: qty 2

## 2020-06-12 MED ORDER — ROCURONIUM BROMIDE 10 MG/ML (PF) SYRINGE
PREFILLED_SYRINGE | INTRAVENOUS | Status: DC | PRN
  Administered 2020-06-12: 10 mg via INTRAVENOUS
  Administered 2020-06-12: 80 mg via INTRAVENOUS

## 2020-06-12 MED ORDER — DEXMEDETOMIDINE (PRECEDEX) IN NS 20 MCG/5ML (4 MCG/ML) IV SYRINGE
PREFILLED_SYRINGE | INTRAVENOUS | Status: AC
  Filled 2020-06-12: qty 5

## 2020-06-12 MED ORDER — ATORVASTATIN CALCIUM 10 MG PO TABS
10.0000 mg | ORAL_TABLET | Freq: Every day | ORAL | Status: DC
  Administered 2020-06-12 – 2020-06-17 (×6): 10 mg via ORAL
  Filled 2020-06-12 (×6): qty 1

## 2020-06-12 MED ORDER — LOSARTAN POTASSIUM 25 MG PO TABS
25.0000 mg | ORAL_TABLET | Freq: Every day | ORAL | Status: DC
  Administered 2020-06-12 – 2020-06-17 (×6): 25 mg via ORAL
  Filled 2020-06-12 (×6): qty 1

## 2020-06-12 MED ORDER — ORAL CARE MOUTH RINSE: 15.0000 mL | Freq: Once | OROMUCOSAL | Status: AC

## 2020-06-12 MED ORDER — OXYCODONE HCL 5 MG PO TABS
5.0000 mg | ORAL_TABLET | ORAL | Status: DC | PRN
  Administered 2020-06-13 – 2020-06-17 (×14): 10 mg via ORAL
  Filled 2020-06-12 (×15): qty 2

## 2020-06-12 MED ORDER — GABAPENTIN 300 MG PO CAPS
300.0000 mg | ORAL_CAPSULE | Freq: Two times a day (BID) | ORAL | Status: DC
  Administered 2020-06-12 – 2020-06-14 (×5): 300 mg via ORAL
  Filled 2020-06-12 (×5): qty 1

## 2020-06-12 MED ORDER — BUPIVACAINE-EPINEPHRINE (PF) 0.25% -1:200000 IJ SOLN
INTRAMUSCULAR | Status: AC
  Filled 2020-06-12: qty 30

## 2020-06-12 MED ORDER — ONDANSETRON HCL 4 MG/2ML IJ SOLN
INTRAMUSCULAR | Status: DC | PRN
  Administered 2020-06-12: 4 mg via INTRAVENOUS

## 2020-06-12 MED ORDER — BUPIVACAINE-EPINEPHRINE (PF) 0.25% -1:200000 IJ SOLN
INTRAMUSCULAR | Status: DC | PRN
  Administered 2020-06-12: 30 mL

## 2020-06-12 MED ORDER — DEXAMETHASONE SODIUM PHOSPHATE 10 MG/ML IJ SOLN
INTRAMUSCULAR | Status: DC | PRN
  Administered 2020-06-12: 8 mg via INTRAVENOUS

## 2020-06-12 MED ORDER — KCL IN DEXTROSE-NACL 20-5-0.45 MEQ/L-%-% IV SOLN
INTRAVENOUS | Status: DC
  Filled 2020-06-12 (×4): qty 1000

## 2020-06-12 MED ORDER — ACETAMINOPHEN 500 MG PO TABS
1000.0000 mg | ORAL_TABLET | ORAL | Status: AC
  Administered 2020-06-12: 1000 mg via ORAL
  Filled 2020-06-12: qty 2

## 2020-06-12 MED ORDER — LIDOCAINE HCL (CARDIAC) PF 100 MG/5ML IV SOSY
PREFILLED_SYRINGE | INTRAVENOUS | Status: DC | PRN
  Administered 2020-06-12: 100 mg via INTRAVENOUS

## 2020-06-12 MED ORDER — MIDAZOLAM HCL 2 MG/2ML IJ SOLN
INTRAMUSCULAR | Status: AC
  Filled 2020-06-12: qty 2

## 2020-06-12 MED ORDER — SUGAMMADEX SODIUM 200 MG/2ML IV SOLN
INTRAVENOUS | Status: DC | PRN
  Administered 2020-06-12: 200 mg via INTRAVENOUS

## 2020-06-12 SURGICAL SUPPLY — 64 items
ADH SKN CLS APL DERMABOND .7 (GAUZE/BANDAGES/DRESSINGS) ×2
APL PRP STRL LF DISP 70% ISPRP (MISCELLANEOUS) ×4
BLADE HEX COATED 2.75 (ELECTRODE) ×3 IMPLANT
BLADE SURG 15 STRL LF DISP TIS (BLADE) ×2 IMPLANT
BLADE SURG 15 STRL SS (BLADE) ×3
CHLORAPREP W/TINT 26 (MISCELLANEOUS) ×5 IMPLANT
COVER MAYO STAND STRL (DRAPES) ×1 IMPLANT
COVER WAND RF STERILE (DRAPES) IMPLANT
DECANTER SPIKE VIAL GLASS SM (MISCELLANEOUS) ×2 IMPLANT
DERMABOND ADVANCED (GAUZE/BANDAGES/DRESSINGS) ×1
DERMABOND ADVANCED .7 DNX12 (GAUZE/BANDAGES/DRESSINGS) ×2 IMPLANT
DRAPE LAPAROSCOPIC ABDOMINAL (DRAPES) ×3 IMPLANT
DRAPE LAPAROTOMY TRNSV 102X78 (DRAPES) ×1 IMPLANT
DRAPE UTILITY XL STRL (DRAPES) ×1 IMPLANT
DRAPE WARM FLUID 44X44 (DRAPES) ×3 IMPLANT
DRSG OPSITE POSTOP 4X10 (GAUZE/BANDAGES/DRESSINGS) IMPLANT
DRSG OPSITE POSTOP 4X6 (GAUZE/BANDAGES/DRESSINGS) IMPLANT
DRSG OPSITE POSTOP 4X8 (GAUZE/BANDAGES/DRESSINGS) IMPLANT
DRSG TELFA 3X8 NADH (GAUZE/BANDAGES/DRESSINGS) IMPLANT
ELECT REM PT RETURN 15FT ADLT (MISCELLANEOUS) ×3 IMPLANT
GAUZE 4X4 16PLY RFD (DISPOSABLE) ×3 IMPLANT
GAUZE SPONGE 4X4 12PLY STRL (GAUZE/BANDAGES/DRESSINGS) ×3 IMPLANT
GLOVE BIO SURGEON STRL SZ 6.5 (GLOVE) ×6 IMPLANT
GLOVE BIOGEL PI IND STRL 7.0 (GLOVE) ×2 IMPLANT
GLOVE BIOGEL PI INDICATOR 7.0 (GLOVE) ×1
GOWN STRL REUS W/TWL XL LVL3 (GOWN DISPOSABLE) ×6 IMPLANT
HANDLE SUCTION POOLE (INSTRUMENTS) ×2 IMPLANT
HOLDER FOLEY CATH W/STRAP (MISCELLANEOUS) IMPLANT
KIT BASIN OR (CUSTOM PROCEDURE TRAY) ×3 IMPLANT
KIT TURNOVER KIT A (KITS) ×2 IMPLANT
MANIFOLD NEPTUNE II (INSTRUMENTS) ×3 IMPLANT
NDL HYPO 25X1 1.5 SAFETY (NEEDLE) ×2 IMPLANT
NEEDLE HYPO 25X1 1.5 SAFETY (NEEDLE) ×3 IMPLANT
PACK BASIC VI WITH GOWN DISP (CUSTOM PROCEDURE TRAY) ×3 IMPLANT
PACK GENERAL/GYN (CUSTOM PROCEDURE TRAY) ×3 IMPLANT
PAD DRESSING TELFA 3X8 NADH (GAUZE/BANDAGES/DRESSINGS) IMPLANT
PAD TELFA 2X3 NADH STRL (GAUZE/BANDAGES/DRESSINGS) ×3 IMPLANT
PENCIL SMOKE EVACUATOR (MISCELLANEOUS) IMPLANT
RELOAD PROXIMATE 75MM BLUE (ENDOMECHANICALS) ×3 IMPLANT
RELOAD STAPLE 75 3.8 BLU REG (ENDOMECHANICALS) IMPLANT
SPONGE LAP 18X18 RF (DISPOSABLE) IMPLANT
STAPLER GUN LINEAR PROX 60 (STAPLE) ×2 IMPLANT
STAPLER PROXIMATE 75MM BLUE (STAPLE) ×2 IMPLANT
SUCTION POOLE HANDLE (INSTRUMENTS) ×3
SUT NOVA NAB DX-16 0-1 5-0 T12 (SUTURE) ×3 IMPLANT
SUT NOVA NAB GS-21 0 18 T12 DT (SUTURE) ×4 IMPLANT
SUT PROLENE 2 0 BLUE (SUTURE) IMPLANT
SUT SILK 2 0 (SUTURE) ×3
SUT SILK 2 0 SH CR/8 (SUTURE) ×3 IMPLANT
SUT SILK 2-0 18XBRD TIE 12 (SUTURE) ×2 IMPLANT
SUT SILK 3 0 (SUTURE) ×3
SUT SILK 3 0 SH CR/8 (SUTURE) ×3 IMPLANT
SUT SILK 3-0 18XBRD TIE 12 (SUTURE) ×2 IMPLANT
SUT VIC AB 2-0 SH 18 (SUTURE) IMPLANT
SUT VIC AB 2-0 SH 27 (SUTURE) ×6
SUT VIC AB 2-0 SH 27X BRD (SUTURE) ×4 IMPLANT
SUT VIC AB 3-0 SH 18 (SUTURE) ×2 IMPLANT
SUT VIC AB 4-0 PS2 18 (SUTURE) ×3 IMPLANT
SUT VICRYL 4-0 PS2 18IN ABS (SUTURE) ×2 IMPLANT
SYR CONTROL 10ML LL (SYRINGE) ×3 IMPLANT
TAPE PAPER 3X10 WHT MICROPORE (GAUZE/BANDAGES/DRESSINGS) ×1 IMPLANT
TOWEL OR 17X26 10 PK STRL BLUE (TOWEL DISPOSABLE) ×6 IMPLANT
TOWEL OR NON WOVEN STRL DISP B (DISPOSABLE) ×6 IMPLANT
YANKAUER SUCT BULB TIP NO VENT (SUCTIONS) ×3 IMPLANT

## 2020-06-12 NOTE — Anesthesia Postprocedure Evaluation (Signed)
Anesthesia Post Note  Patient: Joshua Zhang  Procedure(s) Performed: ILEOSTOMY REVERSAL (N/A Abdomen) REMOVAL PORT-A-CATH (Right Chest)     Patient location during evaluation: PACU Anesthesia Type: General Level of consciousness: awake Pain management: pain level controlled Vital Signs Assessment: post-procedure vital signs reviewed and stable Respiratory status: spontaneous breathing and respiratory function stable Cardiovascular status: stable Postop Assessment: no apparent nausea or vomiting Anesthetic complications: no   No complications documented.  Last Vitals:  Vitals:   06/12/20 1100 06/12/20 1115  BP: 132/90 (!) 132/98  Pulse: (!) 58 (!) 56  Resp: 14 10  Temp:    SpO2: 99% 97%    Last Pain:  Vitals:   06/12/20 1100  TempSrc:   PainSc: Asleep                 Merlinda Frederick

## 2020-06-12 NOTE — Op Note (Signed)
06/12/2020  10:05 AM  PATIENT:  Joshua Zhang  49 y.o. male  Patient Care Team: Merwyn Katos as PCP - General (Physician Assistant) Michel Bickers, MD as Consulting Physician (Infectious Diseases) Leighton Ruff, MD as Consulting Physician (General Surgery) Truitt Merle, MD as Consulting Physician (Hematology) Kyung Rudd, MD as Consulting Physician (Radiation Oncology) Jonnie Finner, RN as Oncology Nurse Navigator  PRE-OPERATIVE DIAGNOSIS:  RECTAL CANCER  POST-OPERATIVE DIAGNOSIS:  RECTAL CANCER  PROCEDURE:   ILEOSTOMY REVERSAL REMOVAL PORT-A-CATH    Surgeon(s): Leighton Ruff, MD Carlena Hurl, PA-C  ASSISTANT: Carlena Hurl, PA   ANESTHESIA:   local and general  EBL:  Total I/O In: 78 [I.V.:1600] Out: 27 [Blood:50]  DRAINS: none   SPECIMEN:  Source of Specimen:  ileostomy  DISPOSITION OF SPECIMEN:  PATHOLOGY  COUNTS:  YES  PLAN OF CARE: Admit to inpatient   PATIENT DISPOSITION:  PACU - hemodynamically stable.  INDICATION: 49 year old male status post low anterior resection with coloanal anastomosis and diverting ileostomy.  He is completed his treatment for his rectal cancer and is ready for ileostomy reversal and port removal.   OR FINDINGS: Ileostomy in place, minimal abdominal adhesions.  DESCRIPTION: the patient was identified in the preoperative holding area and taken to the OR where they were laid supine on the operating room table.  General anesthesia was induced without difficulty. SCDs were also noted to be in place prior to the initiation of anesthesia.  The patient was then prepped and draped in the usual sterile fashion.   A surgical timeout was performed indicating the correct patient, procedure, positioning and need for preoperative antibiotics.   I began with a port removal.  An elliptical incision was made over the previous scar.  This was carried down through subcutaneous tissue using electrocautery.  The port was identified  and separated from the subcutaneous tissues.  I placed a 3-0 Vicryl pursestring suture over the entrance site of the port catheter.  We then remove the port and closed the pursestring suture.  I then used a 3-0 Vicryl suture to close the subcutaneous capsule and subcutaneous tissues.  A 4-0 Vicryl subcuticular suture was used to close the skin.  Dermabond was applied.  We then turned our attention to the ileostomy.  Electrocautery was used to dissect the mucocutaneous junction around the ileostomy site.  We then carried down dissection around the ileostomy making sure not to damage this as we went.  I used Metzenbaum scissors to clear the muscular attachments to the terminal ileum.  I was then able to enter into the abdomen and bluntly dissect the ileostomy away from the abdominal wall.  Once this was free circumferentially, I was able to gently lift this out of the abdomen.  The ileostomy was transected using a blue load 60 mm GIA stapler.  I then made a small enterotomy in both ends of the ileum and used a second 60 mm blue load stapler to create an anastomosis.  A 60 TA stapler was used to close the common enterotomy channel.  3-0 silk sutures were used to imbricate the anastomosis and help with hemostasis.  An antitension suture was placed in the crotch of the anastomosis as well.  We inspected the anastomosis.  Hemostasis was good.  This was then placed back into the abdomen and the omentum was brought down over it.   The fascia was then closed using interrupted #1 Novafil sutures.  The subcutaneous tissue was reapproximated using a 2-0 Vicryl suture.  A Telfa wick was placed in the middle of the ileostomy site for drainage.  The dermal layer was then partially closed using a 2-0 Vicryl pursestring suture.  A sterile dressing was applied.  The patient was then awakened from anesthesia and sent to the postanesthesia care unit in stable condition.  All counts were correct per operating room staff.

## 2020-06-12 NOTE — Discharge Instructions (Signed)
ABDOMINAL SURGERY: POST OP INSTRUCTIONS  1. DIET: see below. 2. Take your usually prescribed home medications unless otherwise directed. 3. PAIN CONTROL: a. Pain is best controlled by a usual combination of three different methods TOGETHER: i. Ice/Heat ii. Over the counter pain medication iii. Prescription pain medication b. Most patients will experience some swelling and bruising around the incisions.  Ice packs or heating pads (30-60 minutes up to 6 times a day) will help. Use ice for the first few days to help decrease swelling and bruising, then switch to heat to help relax tight/sore spots and speed recovery.  Some people prefer to use ice alone, heat alone, alternating between ice & heat.  Experiment to what works for you.  Swelling and bruising can take several weeks to resolve.   c. It is helpful to take an over-the-counter pain medication regularly for the first few weeks.  Choose one of the following that works best for you: i. Naproxen (Aleve, etc)  Two 220mg  tabs twice a day ii. Ibuprofen (Advil, etc) Three 200mg  tabs four times a day (every meal & bedtime) iii. Acetaminophen (Tylenol, etc) 500-650mg  four times a day (every meal & bedtime) d. A  prescription for pain medication (such as oxycodone, hydrocodone, etc) should be given to you upon discharge.  Take your pain medication as prescribed.  i. If you are having problems/concerns with the prescription medicine (does not control pain, nausea, vomiting, rash, itching, etc), please call us [phone] to see if we need to switch you to a different pain medicine that will work better for you and/or control your side effect better. ii. If you need a refill on your pain medication, please contact your pharmacy.  They will contact our office to request authorization. Prescriptions will not be filled after 5 pm or on week-ends. 4. Avoid getting constipated.  Between the surgery and the pain medications, it is common to experience some  constipation.  Increasing fluid intake and taking a fiber supplement (such as Metamucil, Citrucel, FiberCon, MiraLax, etc) 1-2 times a day regularly will usually help prevent this problem from occurring.  A mild laxative (prune juice, Milk of Magnesia, MiraLax, etc) should be taken according to package directions if there are no bowel movements after 48 hours.   5. Watch out for diarrhea.  If you have many loose bowel movements, simplify your diet to bland foods & liquids for a few days.  Stop any stool softeners and decrease your fiber supplement.  Switching to mild anti-diarrheal medications (Kayopectate, Pepto Bismol) can help.  If this worsens or does not improve, please call us. 6. Wash / shower every day.  You may shower over the incision / wound.  Avoid baths until the skin is fully healed.  Continue to shower over incision(s) after the dressing is off. 7. Remove your bandages every day and wash with soap and water.  Cover the incision with a clean dressing. 8. ACTIVITIES as tolerated:   a. You may resume regular (light) daily activities beginning the next day--such as daily self-care, walking, climbing stairs--gradually increasing activities as tolerated.  If you can walk 30 minutes without difficulty, it is safe to try more intense activity such as jogging, treadmill, bicycling, low-impact aerobics, swimming, etc. b. Save the most intensive and strenuous activity for last such as sit-ups, heavy lifting, contact sports, etc  Refrain from any heavy lifting or straining until you are off narcotics for pain control.   c. DO NOT PUSH THROUGH PAIN.  Let  pain be your guide: If it hurts to do something, don't do it.  Pain is your body warning you to avoid that activity for another week until the pain goes down. d. You may drive when you are no longer taking prescription pain medication, you can comfortably wear a seatbelt, and you can safely maneuver your car and apply brakes. e. Dennis Bast may have sexual  intercourse when it is comfortable.  9. FOLLOW UP in our office a. Please call [facility] at [phone] to set up an appointment to see your surgeon in the office for a follow-up appointment approximately 1-2 weeks after your surgery. b. Make sure that you call for this appointment the day you arrive home to insure a convenient appointment time. 10. IF YOU HAVE DISABILITY OR FAMILY LEAVE FORMS, BRING THEM TO THE OFFICE FOR PROCESSING.  DO NOT GIVE THEM TO YOUR DOCTOR.   WHEN TO CALL us [phone]: 1. Poor pain control 2. Reactions / problems with new medications (rash/itching, nausea, etc)  3. Fever over 101.5 F (38.5 C) 4. Inability to urinate 5. Nausea and/or vomiting 6. Worsening swelling or bruising 7. Continued bleeding from incision. 8. Increased pain, redness, or drainage from the incision  The clinic staff is available to answer your questions during regular business hours (8:30am-5pm).  Please don't hesitate to call and ask to speak to one of our nurses for clinical concerns.   A surgeon from [facility] is always on call at the hospitals   If you have a medical emergency, go to the nearest emergency room or call 911.    [facility], [address], [place], [place], [place]  [address] ? MAIN: [phone] ? TOLL FREE: [phone] ? FAX [phone] [email or web address]  Coloanal anastomosis instructions  Diet Since many people have frequent bowel movements right after the operation, following a diet can help reduce the number of stools. Incorporate new foods into your diet one at a time to see how they affect your stool output. You may find that some foods give you trouble initially, but that you can tolerate them better later.  Do not skip meals. This tends to make the stools more irritating and loose.  Balancing starches with foods that tend to give diarrhea is also helpful in controlling the frequency of bowel  movements.  Once your colon has been removed, you will need more salt until your body adjusts to not having a colon. Pretzels and corn chips are good snacks.  Hot and spicy foods will probably burn on the way out and should be avoided if your anal area feels irritated.  Seeds and nuts also can be irritating.  Within three to nine months after surgery, your body will have started to adjust. At this point, you should try to eat all types of foods and see how they affect you. Some patients have tried a low glycemic index diet, which includes foods that are high in fiber and cause a slow rise in blood sugar, to help control their bowel movements. Resources on the Internet and at your [employer] are available for more information on this type of diet.  Fluids are important to prevent dehydration. Drink enough fluid so your urine is light yellow in color. Avoid fruit juices, carbonated beverages, drinks with caffeine and straws (swallowed air increases gas). Rather than drinking fluids with your meal, try drinking fluids at the end of your meal, which may help slow down your stool.  Many people ask about vitamin supplements after their operation when they are limiting fruits and vegetables. It is fine to take vitamins, but they should be chewable or in liquid form, otherwise they may not be fully absorbed.  Stool Frequency It is normal at first for patients have between five to eight stools a day. As the reservoir adapts and stretches to its normal capacity, the number of stools you have per day should decrease. You will probably have many stools while you are in the hospital, but these should lessen over the first few weeks. Often the first stools are without your control while in the hospital, though this is resolved before you go home. The biggest increase in stools usually occurs after you begin eating. You can expect your number of bowel movements to decrease gradually. The stools will range from watery  to a paste-like consistency. By watching your diet, you can avoid foods that tend to produce loose stools. The more frequent your bowel movements, the more itching and burning you will have around your anus. A combined approach of diet and medications, such as Imodium and Lomotil, may help decrease your number of bowel movements. Most people need to take these medications more frequently right after having their ileoanal reservoir procedure, but only use them occasionally as their reservoir function improves. For some people, adding a fiber supplement, such as Metamucil, Benefiber, Konsyl, Citracel or a generic equivalent, and taking them with half the recommended amount of water, can help thicken their stool and reduce the number of bowel movements. Nighttime Stool Frequency Stool frequency at night is a common problem and can be very disruptive of a good night's sleep. More than half of [facility] patients surveyed -- about 63 percent -- get up once or twice per night to pass stool, and about 24 percent of patients awaken three times or more during the night to have a bowel movement. This can be related to eating late, overeating or eating foods known to cause problems. Tips for decreasing the number of stools at night include: Don't eat late. Wait at least three to four hours after your last meal before going to bed.  Take an anti-diarrheal medication before going to bed.  Eat binding foods at dinner and avoid those foods that tend to cause diarrhea.  Don't overeat at dinner. Diarrhea Diarrhea occurs when your stool is very watery and more frequent than usual. Diarrhea can be caused by eating certain types of foods, eating too much of any food, pouchitis and other illnesses like the flu. If you develop the flu early in your recovery and have diarrhea, you may become dehydrated and need to be hospitalized. Sometimes people have diarrhea because they don't have the enzyme called lactase needed to digest the  sugar in milk products. If you feel bloated and have cramps and gas after drinking milk, try not drinking it and see if symptoms go away. You can also try fermented milk products, such as yogurt, hard cheese and buttermilk, as well as soy or goat's milk. Adding fiber supplements, such as Metamucil, Benefiber, Konsyl, Citracel or the generic equivalent, can help thicken your stool and reduce diarrhea. Gatorade or other sport drinks can also be helpful in treating diarrhea by keeping you hydrated. Skin Care Until your body adjusts to your new internal pouch, you may experience some leakage of stool, especially at night. Liquid stool is very irritating to the skin around the anus, so it is important to keep this area clean  and dry. Use soft, white, non-perfumed toilet tissue to blot gently after each bowel movement, drying the skin completely. Do not scratch, rub or wipe the skin. It may help to spray water from a squirt bottle. Some people find drying the skin with a blow dryer helps. For irritated skin around the anus, warm baths are very soothing. Do not apply anything to the skin that burns or irritates. Moist cotton balls may be better for wiping if your skin is very sore. Some people use baby wipes to cleanse themselves. Use a brand without alcohol or scents. Some people tuck a small piece of toilet tissue or a dry cotton ball near the anal opening to protect their skin from small amounts of leakage. Scented soaps and tissues should not be used since these may cause irritation. A protective ointment may be used after each bowel movement to keep the stool off the skin. You may want to wear a protective pad to keep your clothing clean. Anal itching and burning are often not visible on the outside because the irritated area is actually on the inside. You can look with a mirror to see if your irritation is on the outside skin. Some of the itching and burning is a normal part of the internal healing and will go  away in time. Some people have found Pepto Bismol taken by mouth is helpful for the burning. Sometimes people develop a yeast infection around the anal area. It happens more commonly when people are taking antibiotics, or in women around the time of their period. If you get an itchy red rash, you may need an anti-fungal cream or ointment. This is available over the counter. Occasionally people are allergic to the preparations they are using around their anus. This is not very common. Some products contain lanolin -- if you are allergic to this, check the ingredients of ointments before using. Incontinence Mild incontinence or leakage of stool from your pouch is a common problem that improves with time as your stool thickens, your pouch stretches and your sphincters become stronger. The incontinence is usually worse at night when your sphincters relax. The looser the stool, the more likely it is to leak. Some people accidentally pass stool when they are passing gas. In time, most people develop an awareness of whether they are passing stool or gas, but initially this can be a problem. It is also important to note that if you develop incontinence later on, you may have pouchitis. According to our survey, 60 percent of people occasionally pass stool without control, usually at night while in a deep sleep. About 8 percent occasionally pass some liquid stool during the day. About 35 percent notice staining of stool on their underwear at least once a month during the day or the night. Often this can be related to eating something that causes a problem, having pouchitis, overeating, going to bed soon after eating, or just being in a very deep sleep. Medications, diet and careful skin care are all important during this period of adaptation. A small pad helps absorb leakage. Some people find sleeping on a small towel or pad also is helpful. Before your operation, it is a good idea to buy more cotton underwear.

## 2020-06-12 NOTE — Interval H&P Note (Signed)
History and Physical Interval Note:  06/12/2020 8:12 AM  Joshua Zhang  has presented today for surgery, with the diagnosis of RECTAL CANCER.  The various methods of treatment have been discussed with the patient and family. After consideration of risks, benefits and other options for treatment, the patient has consented to  Procedure(s): ILEOSTOMY REVERSAL (N/A) REMOVAL PORT-A-CATH (N/A) as a surgical intervention.  The patient's history has been reviewed, patient examined, no change in status, stable for surgery.  I have reviewed the patient's chart and labs.  Questions were answered to the patient's satisfaction.     Rosario Adie, MD  [facility]

## 2020-06-12 NOTE — Transfer of Care (Signed)
Immediate Anesthesia Transfer of Care Note  Patient: Joshua Zhang  Procedure(s) Performed: ILEOSTOMY REVERSAL (N/A Abdomen) REMOVAL PORT-A-CATH (Right Chest)  Patient Location: PACU  Anesthesia Type:General  Level of Consciousness: drowsy  Airway & Oxygen Therapy: Patient Spontanous Breathing and Patient connected to face mask oxygen  Post-op Assessment: Report given to RN and Post -op Vital signs reviewed and stable  Post vital signs: Reviewed and stable  Last Vitals:  Vitals Value Taken Time  BP 131/90 06/12/20 1007  Temp 36.4 C 06/12/20 1007  Pulse 73 06/12/20 1008  Resp 12 06/12/20 1008  SpO2 100 % 06/12/20 1008  Vitals shown include unvalidated device data.  Last Pain:  Vitals:   06/12/20 0655  TempSrc: Oral  PainSc:       Patients Stated Pain Goal: 3 (88/41/66 0630)  Complications: No complications documented.

## 2020-06-12 NOTE — Anesthesia Procedure Notes (Signed)
Procedure Name: Intubation Date/Time: 06/12/2020 8:34 AM Performed by: Raenette Rover, CRNA Pre-anesthesia Checklist: Patient identified, Emergency Drugs available, Suction available and Patient being monitored Patient Re-evaluated:Patient Re-evaluated prior to induction Oxygen Delivery Method: Circle system utilized Preoxygenation: Pre-oxygenation with 100% oxygen Induction Type: IV induction Ventilation: Mask ventilation without difficulty Laryngoscope Size: Mac and 4 Grade View: Grade I Tube type: Oral Tube size: 7.5 mm Number of attempts: 1 Airway Equipment and Method: Stylet Placement Confirmation: ETT inserted through vocal cords under direct vision,  positive ETCO2 and breath sounds checked- equal and bilateral Secured at: 23 cm Tube secured with: Tape Dental Injury: Teeth and Oropharynx as per pre-operative assessment

## 2020-06-13 LAB — CBC
HCT: 34.4 % — ABNORMAL LOW (ref 39.0–52.0)
Hemoglobin: 12.5 g/dL — ABNORMAL LOW (ref 13.0–17.0)
MCH: 36 pg — ABNORMAL HIGH (ref 26.0–34.0)
MCHC: 36.3 g/dL — ABNORMAL HIGH (ref 30.0–36.0)
MCV: 99.1 fL (ref 80.0–100.0)
Platelets: 159 10*3/uL (ref 150–400)
RBC: 3.47 MIL/uL — ABNORMAL LOW (ref 4.22–5.81)
RDW: 12.6 % (ref 11.5–15.5)
WBC: 9.7 10*3/uL (ref 4.0–10.5)
nRBC: 0 % (ref 0.0–0.2)

## 2020-06-13 LAB — BASIC METABOLIC PANEL
Anion gap: 10 (ref 5–15)
BUN: 11 mg/dL (ref 6–20)
CO2: 23 mmol/L (ref 22–32)
Calcium: 8.8 mg/dL — ABNORMAL LOW (ref 8.9–10.3)
Chloride: 104 mmol/L (ref 98–111)
Creatinine, Ser: 0.91 mg/dL (ref 0.61–1.24)
GFR, Estimated: >60 mL/min (ref >60)
Glucose, Bld: 149 mg/dL — ABNORMAL HIGH (ref 70–99)
Potassium: 4 mmol/L (ref 3.5–5.1)
Sodium: 137 mmol/L (ref 135–145)

## 2020-06-13 NOTE — Progress Notes (Signed)
1 Day Post-Op   Subjective/Chief Complaint: Much more comfortable now than earlier No flatus yet Denies nausea   Objective: Vital signs in last 24 hours: Temp:  [97.6 F (36.4 C)-98.6 F (37 C)] 98 F (36.7 C) (11/25 0515) Pulse Rate:  [56-82] 71 (11/25 0515) Resp:  [10-18] 18 (11/25 0515) BP: (122-150)/(80-98) 138/80 (11/25 0515) SpO2:  [94 %-100 %] 96 % (11/25 0515) Weight:  [97.7 kg] 97.7 kg (11/25 0423) Last BM Date: 06/12/20  Intake/Output from previous day: 11/24 0701 - 11/25 0700 In: 2466.5 [P.O.:480; I.V.:1986.5] Out: 1650 [Urine:1600; Blood:50] Intake/Output this shift: No intake/output data recorded.  Exam: Awake and alert Looks comfortable RLQ dressing dry and intact, abdomen soft  Lab Results:  Recent Labs    06/13/20 0442  WBC 9.7  HGB 12.5*  HCT 34.4*  PLT 159   BMET Recent Labs    06/13/20 0442  NA 137  K 4.0  CL 104  CO2 23  GLUCOSE 149*  BUN 11  CREATININE 0.91  CALCIUM 8.8*   PT/INR No results for input(s): LABPROT, INR in the last 72 hours. ABG No results for input(s): PHART, HCO3 in the last 72 hours.  Invalid input(s): PCO2, PO2  Studies/Results: No results found.  Anti-infectives: Anti-infectives (From admission, onward)   Start     Dose/Rate Route Frequency Ordered Stop   06/13/20 1000  bictegravir-emtricitabine-tenofovir AF (BIKTARVY) 50-200-25 MG per tablet 1 tablet        1 tablet Oral Daily 06/12/20 1150     06/12/20 2030  cefoTEtan (CEFOTAN) 2 g in sodium chloride 0.9 % 100 mL IVPB        2 g 200 mL/hr over 30 Minutes Intravenous Every 12 hours 06/12/20 1150 06/12/20 2125   06/12/20 0645  cefoTEtan (CEFOTAN) 2 g in sodium chloride 0.9 % 100 mL IVPB        2 g 200 mL/hr over 30 Minutes Intravenous On call to O.R. 06/12/20 1287 06/12/20 0841      Assessment/Plan: s/p Procedure(s): ILEOSTOMY REVERSAL (N/A) REMOVAL PORT-A-CATH (Right)  Ambulate Awaiting return of bowel function Keep on fulls today  LOS: 1  day    Coralie Keens MD 06/13/2020

## 2020-06-14 ENCOUNTER — Inpatient Hospital Stay (HOSPITAL_COMMUNITY): Payer: BC Managed Care – PPO

## 2020-06-14 ENCOUNTER — Encounter (HOSPITAL_COMMUNITY): Payer: Self-pay | Admitting: General Surgery

## 2020-06-14 DIAGNOSIS — Z9889 Other specified postprocedural states: Secondary | ICD-10-CM

## 2020-06-14 DIAGNOSIS — Z9221 Personal history of antineoplastic chemotherapy: Secondary | ICD-10-CM

## 2020-06-14 DIAGNOSIS — R739 Hyperglycemia, unspecified: Secondary | ICD-10-CM

## 2020-06-14 DIAGNOSIS — I10 Essential (primary) hypertension: Secondary | ICD-10-CM | POA: Diagnosis present

## 2020-06-14 DIAGNOSIS — G629 Polyneuropathy, unspecified: Secondary | ICD-10-CM

## 2020-06-14 LAB — BASIC METABOLIC PANEL
Anion gap: 10 (ref 5–15)
BUN: 10 mg/dL (ref 6–20)
CO2: 23 mmol/L (ref 22–32)
Calcium: 8.9 mg/dL (ref 8.9–10.3)
Chloride: 100 mmol/L (ref 98–111)
Creatinine, Ser: 0.96 mg/dL (ref 0.61–1.24)
GFR, Estimated: >60 mL/min (ref >60)
Glucose, Bld: 162 mg/dL — ABNORMAL HIGH (ref 70–99)
Potassium: 3.6 mmol/L (ref 3.5–5.1)
Sodium: 133 mmol/L — ABNORMAL LOW (ref 135–145)

## 2020-06-14 LAB — CBC
HCT: 37.3 % — ABNORMAL LOW (ref 39.0–52.0)
Hemoglobin: 13.6 g/dL (ref 13.0–17.0)
MCH: 36.7 pg — ABNORMAL HIGH (ref 26.0–34.0)
MCHC: 36.5 g/dL — ABNORMAL HIGH (ref 30.0–36.0)
MCV: 100.5 fL — ABNORMAL HIGH (ref 80.0–100.0)
Platelets: 147 10*3/uL — ABNORMAL LOW (ref 150–400)
RBC: 3.71 MIL/uL — ABNORMAL LOW (ref 4.22–5.81)
RDW: 12.8 % (ref 11.5–15.5)
WBC: 8.8 10*3/uL (ref 4.0–10.5)
nRBC: 0 % (ref 0.0–0.2)

## 2020-06-14 LAB — GLUCOSE, CAPILLARY
Glucose-Capillary: 124 mg/dL — ABNORMAL HIGH (ref 70–99)
Glucose-Capillary: 139 mg/dL — ABNORMAL HIGH (ref 70–99)
Glucose-Capillary: 139 mg/dL — ABNORMAL HIGH (ref 70–99)

## 2020-06-14 LAB — SURGICAL PATHOLOGY

## 2020-06-14 MED ORDER — LIP MEDEX EX OINT
1.0000 "application " | TOPICAL_OINTMENT | Freq: Two times a day (BID) | CUTANEOUS | Status: DC
  Administered 2020-06-14 – 2020-06-17 (×7): 1 via TOPICAL
  Filled 2020-06-14: qty 7

## 2020-06-14 MED ORDER — MAGIC MOUTHWASH
15.0000 mL | Freq: Four times a day (QID) | ORAL | Status: DC | PRN
  Filled 2020-06-14: qty 15

## 2020-06-14 MED ORDER — SODIUM CHLORIDE 0.9 % IV SOLN: 250.0000 mL | INTRAVENOUS | Status: DC | PRN

## 2020-06-14 MED ORDER — SIMETHICONE 40 MG/0.6ML PO SUSP
40.0000 mg | Freq: Four times a day (QID) | ORAL | Status: AC
  Administered 2020-06-14 – 2020-06-15 (×8): 40 mg via ORAL
  Filled 2020-06-14 (×8): qty 0.6

## 2020-06-14 MED ORDER — PHENOL 1.4 % MT LIQD
2.0000 | OROMUCOSAL | Status: DC | PRN
  Filled 2020-06-14: qty 177

## 2020-06-14 MED ORDER — LACTATED RINGERS IV BOLUS: 1000.0000 mL | Freq: Three times a day (TID) | INTRAVENOUS | Status: AC | PRN

## 2020-06-14 MED ORDER — ACETAMINOPHEN 500 MG PO TABS
500.0000 mg | ORAL_TABLET | Freq: Three times a day (TID) | ORAL | Status: DC
  Administered 2020-06-14 – 2020-06-17 (×12): 500 mg via ORAL
  Filled 2020-06-14 (×12): qty 1

## 2020-06-14 MED ORDER — GABAPENTIN 300 MG PO CAPS
300.0000 mg | ORAL_CAPSULE | Freq: Three times a day (TID) | ORAL | Status: DC
  Administered 2020-06-14 – 2020-06-17 (×9): 300 mg via ORAL
  Filled 2020-06-14 (×9): qty 1

## 2020-06-14 MED ORDER — INSULIN ASPART 100 UNIT/ML ~~LOC~~ SOLN
0.0000 [IU] | Freq: Three times a day (TID) | SUBCUTANEOUS | Status: DC
  Administered 2020-06-14 – 2020-06-15 (×3): 2 [IU] via SUBCUTANEOUS

## 2020-06-14 MED ORDER — DIPHENHYDRAMINE HCL 50 MG/ML IJ SOLN: 12.5000 mg | Freq: Four times a day (QID) | INTRAMUSCULAR | Status: DC | PRN

## 2020-06-14 MED ORDER — MENTHOL 3 MG MT LOZG: 1.0000 | LOZENGE | OROMUCOSAL | Status: DC | PRN

## 2020-06-14 MED ORDER — INSULIN ASPART 100 UNIT/ML ~~LOC~~ SOLN: 0.0000 [IU] | Freq: Every day | SUBCUTANEOUS | Status: DC

## 2020-06-14 MED ORDER — SODIUM CHLORIDE 0.9% FLUSH: 3.0000 mL | INTRAVENOUS | Status: DC | PRN

## 2020-06-14 MED ORDER — METHOCARBAMOL 1000 MG/10ML IJ SOLN
1000.0000 mg | Freq: Four times a day (QID) | INTRAVENOUS | Status: DC | PRN
  Filled 2020-06-14: qty 10

## 2020-06-14 MED ORDER — PROCHLORPERAZINE EDISYLATE 10 MG/2ML IJ SOLN: 5.0000 mg | INTRAMUSCULAR | Status: DC | PRN

## 2020-06-14 MED ORDER — SODIUM CHLORIDE 0.9% FLUSH
3.0000 mL | Freq: Two times a day (BID) | INTRAVENOUS | Status: DC
  Administered 2020-06-14: 3 mL via INTRAVENOUS
  Administered 2020-06-14: 2.5 mL via INTRAVENOUS
  Administered 2020-06-15 – 2020-06-17 (×5): 3 mL via INTRAVENOUS

## 2020-06-14 NOTE — Progress Notes (Signed)
Patient had c/o of nausea earlier this AM, PRN given. No emesis, but patient abdomen looks to be more taut and distended. MD on call informed via paging system. Dawson Bills

## 2020-06-14 NOTE — Progress Notes (Signed)
Joshua Zhang [id number] [birth year]  CARE TEAM:  PCP: Heywood Bene, PA-C  Outpatient Care Team: Patient Care Team: Merwyn Katos as PCP - General (Physician Assistant) Michel Bickers, MD as Consulting Physician (Infectious Diseases) Leighton Ruff, MD as Consulting Physician (General Surgery) Truitt Merle, MD as Consulting Physician (Hematology) Kyung Rudd, MD as Consulting Physician (Radiation Oncology) Jonnie Finner, RN as Oncology Nurse Navigator  Inpatient Treatment Team: Treatment Team: Attending Provider: Leighton Ruff, MD; Technician: Ernest Mallick, NT; Utilization Review: Glean Salvo, RN; Technician: Karna Christmas, NT; Registered Nurse: Aura Dials, RN; Social Worker: Lia Hopping, LCSW   Problem List:   Principal Problem:   Status post reversal of ileostomy Active Problems:   Rectal adenocarcinoma ([place])   HIV disease ([place])   Renal insufficiency   Ileostomy present ([place])   Neuropathy   Hypertension   History of chemotherapy   2 Days Post-Op  06/12/2020  POST-OPERATIVE DIAGNOSIS:  RECTAL CANCER  PROCEDURE:   ILEOSTOMY REVERSAL REMOVAL PORT-A-CATH  Surgeon(s): Leighton Ruff, MD  INDICATION: 49 year old male status post low anterior resection with coloanal anastomosis and diverting ileostomy.  He is completed his treatment for his rectal cancer and is ready for ileostomy reversal and port removal.  OR FINDINGS: Ileostomy in place, minimal abdominal adhesions.  Assessment  Ileus resolving somewhat  [facility] Stay = 2 days)  Plan:  -Distention and films concerning for ileus.  However, patient claims to passing gas and bowel movements this morning - feeling better.   Keep at dysphagia 1/full liquids per his request, but low threshold to hold PO.  NG tube if worse.  I cautioned against him forcing p.o.  Especially in light of him taking in 3 L PO yesterday.  He denies any nausea or vomiting and promises  to pace himself today.  Wife and nurse at bedside.  Stop IV fluids to avoid fluid overload with PRN backup fluids.  Follow electrolytes  Hyperglycemia.  Stop D5 fluids.  Check hemoglobin A1c.  Sliding scale insulin.  Blood pressure control.  HIV positive.  Standard antiretroviral medicines.  Standing oral acetaminophen gabapentin pain control with as needed breakthrough.  Try and get him up mobilizing.  -VTE prophylaxis- SCDs, etc  -mobilize as tolerated to help recovery  25 minutes spent in review, evaluation, examination, counseling, and coordination of care.  More than 50% of that time was spent in counseling.  06/14/2020    Subjective: (Chief complaint)  Patient noting cramping and distention last night.  Claims to pass gas and had bowel once this morning, feeling better.  Wife and nurse in room at bedside.  Patient denies any nausea or vomiting.  Tolerating liquids  Objective:  Vital signs:  Vitals:   06/13/20 1331 06/13/20 1900 06/13/20 2141 06/14/20 0538  BP: (!) 161/100 (!) 144/96 (!) 132/91 (!) 144/96  Pulse: 67 74 97 85  Resp: 18  18 18   Temp: 97.7 F (36.5 C)  99.6 F (37.6 C) 98.9 F (37.2 C)  TempSrc: Oral  Oral Oral  SpO2: 97%  97% 95%  Weight:      Height:        Last BM Date: 06/12/20  Intake/Output   Yesterday:  11/25 0701 - 11/26 0700 In: 4017.5 [P.O.:2320; I.V.:1697.5] Out: 2125 [Urine:2125] This shift:  No intake/output data recorded.  Bowel function:  Flatus: YES  BM:  YES  Drain: (No drain)   Physical Exam:  General: Pt awake/alert in no acute distress Eyes: PERRL,  normal EOM.  Sclera clear.  No icterus Neuro: CN II-XII intact w/o focal sensory/motor deficits. Lymph: No head/neck/groin lymphadenopathy Psych:  No delerium/psychosis/paranoia.  Oriented x 4 HENT: Normocephalic, Mucus membranes moist.  No thrush Neck: Supple, No tracheal deviation.  No obvious thyromegaly Chest: No pain to chest wall compression.  Good  respiratory excursion.  No audible wheezing CV:  Pulses intact.  Regular rhythm.  No major extremity edema MS: Normal AROM mjr joints.  No obvious deformity  Abdomen: Somewhat firm.  Moderately distended.  Mildly tender at incisions only.  No evidence of peritonitis.  No incarcerated hernias.  Ext:   No deformity.  No mjr edema.  No cyanosis Skin: No petechiae / purpurea.  No major sores.  Warm and dry    Results:   Cultures: Recent Results (from the past 720 hour(s))  SARS CORONAVIRUS 2 (TAT 6-24 HRS) Nasopharyngeal Nasopharyngeal Swab     Status: None   Collection Time: 06/08/20  1:31 PM   Specimen: Nasopharyngeal Swab  Result Value Ref Range Status   SARS Coronavirus 2 NEGATIVE NEGATIVE Final    Comment: (NOTE) SARS-CoV-2 target nucleic acids are NOT DETECTED.  The SARS-CoV-2 RNA is generally detectable in upper and lower respiratory specimens during the acute phase of infection. Negative results do not preclude SARS-CoV-2 infection, do not rule out co-infections with other pathogens, and should not be used as the sole basis for treatment or other patient management decisions. Negative results must be combined with clinical observations, patient history, and epidemiological information. The expected result is Negative.  Fact Sheet for Patients: SugarRoll.be  Fact Sheet for Healthcare Providers: https://www.woods-mathews.com/  This test is not yet approved or cleared by the [place] FDA and  has been authorized for detection and/or diagnosis of SARS-CoV-2 by FDA under an Emergency Use Authorization (EUA). This EUA will remain  in effect (meaning this test can be used) for the duration of the COVID-19 declaration under Se ction 564(b)(1) of the Act, 21 U.S.C. section 360bbb-3(b)(1), unless the authorization is terminated or revoked sooner.  Performed at [facility], [place] [address], [place], [place] [address]      Labs: Results for orders placed or performed during the hospital encounter of 06/12/20 (from the past 48 hour(s))  CBC     Status: Abnormal   Collection Time: 06/13/20  4:42 AM  Result Value Ref Range   WBC 9.7 4.0 - 10.5 K/uL   RBC 3.47 (L) 4.22 - 5.81 MIL/uL   Hemoglobin 12.5 (L) 13.0 - 17.0 g/dL   HCT 34.4 (L) 39 - 52 %   MCV 99.1 80.0 - 100.0 fL   MCH 36.0 (H) 26.0 - 34.0 pg   MCHC 36.3 (H) 30.0 - 36.0 g/dL   RDW 12.6 11.5 - 15.5 %   Platelets 159 150 - 400 K/uL   nRBC 0.0 0.0 - 0.2 %    Comment: Performed at [facility] [place] [address], [place], [place] [address]  Basic metabolic panel     Status: Abnormal   Collection Time: 06/13/20  4:42 AM  Result Value Ref Range   Sodium 137 135 - 145 mmol/L   Potassium 4.0 3.5 - 5.1 mmol/L   Chloride 104 98 - 111 mmol/L   CO2 23 22 - 32 mmol/L   Glucose, Bld 149 (H) 70 - 99 mg/dL    Comment: Glucose reference range applies only to samples taken after fasting for at least 8 hours.   BUN 11  6 - 20 mg/dL   Creatinine, Ser 0.91 0.61 - 1.24 mg/dL   Calcium 8.8 (L) 8.9 - 10.3 mg/dL   GFR, Estimated >60 >60 mL/min    Comment: (NOTE) Calculated using the CKD-EPI Creatinine Equation (2021)    Anion gap 10 5 - 15    Comment: Performed at [facility], [place] [address], [place], [place] [address]  CBC     Status: Abnormal   Collection Time: 06/14/20  3:32 AM  Result Value Ref Range   WBC 8.8 4.0 - 10.5 K/uL   RBC 3.71 (L) 4.22 - 5.81 MIL/uL   Hemoglobin 13.6 13.0 - 17.0 g/dL   HCT 37.3 (L) 39 - 52 %   MCV 100.5 (H) 80.0 - 100.0 fL   MCH 36.7 (H) 26.0 - 34.0 pg   MCHC 36.5 (H) 30.0 - 36.0 g/dL   RDW 12.8 11.5 - 15.5 %   Platelets 147 (L) 150 - 400 K/uL   nRBC 0.0 0.0 - 0.2 %    Comment: Performed at [facility], [place] [address], [place], [place] [address]  Basic metabolic panel     Status: Abnormal   Collection Time: 06/14/20  3:32 AM  Result Value Ref Range   Sodium 133  (L) 135 - 145 mmol/L   Potassium 3.6 3.5 - 5.1 mmol/L   Chloride 100 98 - 111 mmol/L   CO2 23 22 - 32 mmol/L   Glucose, Bld 162 (H) 70 - 99 mg/dL    Comment: Glucose reference range applies only to samples taken after fasting for at least 8 hours.   BUN 10 6 - 20 mg/dL   Creatinine, Ser 0.96 0.61 - 1.24 mg/dL   Calcium 8.9 8.9 - 10.3 mg/dL   GFR, Estimated >60 >60 mL/min    Comment: (NOTE) Calculated using the CKD-EPI Creatinine Equation (2021)    Anion gap 10 5 - 15    Comment: Performed at [facility], [place] [address], [place], [place] [address]    Imaging / Studies: DG Abd Portable 1V  Result Date: 06/14/2020 CLINICAL DATA:  Abdominal distention.  Post ileostomy reversal. EXAM: PORTABLE ABDOMEN - 1 VIEW COMPARISON:  CT 11/21/2019. FINDINGS: Hemidiaphragms not imaged. Distended loops of small large bowel noted suggesting adynamic ileus. Follow-up exams suggested to demonstrate resolution and to exclude bowel obstruction. Pelvic calcifications consistent phleboliths. Mild degenerative changes lumbar spine and both hips. IMPRESSION: Distended loops of small and large bowel noted suggesting adynamic ileus. Follow-up exams suggested to demonstrate resolution and to exclude bowel obstruction. Electronically Signed   By: Marcello Moores  Register   On: 06/14/2020 07:25    Medications / Allergies: per chart  Antibiotics: Anti-infectives (From admission, onward)   Start     Dose/Rate Route Frequency Ordered Stop   06/13/20 1000  bictegravir-emtricitabine-tenofovir AF (BIKTARVY) 50-200-25 MG per tablet 1 tablet        1 tablet Oral Daily 06/12/20 1150     06/12/20 2030  cefoTEtan (CEFOTAN) 2 g in sodium chloride 0.9 % 100 mL IVPB        2 g 200 mL/hr over 30 Minutes Intravenous Every 12 hours 06/12/20 1150 06/12/20 2125   06/12/20 0645  cefoTEtan (CEFOTAN) 2 g in sodium chloride 0.9 % 100 mL IVPB        2 g 200 mL/hr over 30 Minutes Intravenous On call to O.R. 06/12/20 6767  06/12/20 0841        Note: Portions of this report  may have been transcribed using voice recognition software. Every effort was made to ensure accuracy; however, inadvertent computerized transcription errors may be present.   Any transcriptional errors that result from this process are unintentional.    Adin Hector, MD, FACS, MASCRS Gastrointestinal and Minimally Invasive Surgery  [facility] [address] [place], [place] [address] [phone] Fax [phone] Main/Paging  CONTACT INFORMATION: Weekday (9AM-5PM) concerns: Call [facility] main office at [phone] Weeknight (5PM-9AM) or Weekend/Holiday concerns: Check [email or web address] for General Surgery [facility] coverage (Please, do not use SecureChat as it is not reliable communication to operating surgeons for immediate patient care)      06/14/2020  9:41 AM

## 2020-06-15 LAB — MAGNESIUM: Magnesium: 1.9 mg/dL (ref 1.7–2.4)

## 2020-06-15 LAB — GLUCOSE, CAPILLARY
Glucose-Capillary: 118 mg/dL — ABNORMAL HIGH (ref 70–99)
Glucose-Capillary: 130 mg/dL — ABNORMAL HIGH (ref 70–99)
Glucose-Capillary: 108 mg/dL — ABNORMAL HIGH (ref 70–99)
Glucose-Capillary: 121 mg/dL — ABNORMAL HIGH (ref 70–99)

## 2020-06-15 LAB — CBC
HCT: 36.6 % — ABNORMAL LOW (ref 39.0–52.0)
Hemoglobin: 13.3 g/dL (ref 13.0–17.0)
MCH: 36.2 pg — ABNORMAL HIGH (ref 26.0–34.0)
MCHC: 36.3 g/dL — ABNORMAL HIGH (ref 30.0–36.0)
MCV: 99.7 fL (ref 80.0–100.0)
Platelets: 160 10*3/uL (ref 150–400)
RBC: 3.67 MIL/uL — ABNORMAL LOW (ref 4.22–5.81)
RDW: 12.9 % (ref 11.5–15.5)
WBC: 9.4 10*3/uL (ref 4.0–10.5)
nRBC: 0 % (ref 0.0–0.2)

## 2020-06-15 LAB — POTASSIUM: Potassium: 3.7 mmol/L (ref 3.5–5.1)

## 2020-06-15 LAB — CREATININE, SERUM
Creatinine, Ser: 0.91 mg/dL (ref 0.61–1.24)
GFR, Estimated: >60 mL/min (ref >60)

## 2020-06-15 NOTE — Progress Notes (Signed)
Pharmacy Brief Note - Alvimopan (Entereg)  The standing order set for alvimopan (Entereg) now includes an automatic order to discontinue the drug after the patient has had a bowel movement. The change was approved by the [place] and the Medical Executive Committee.   This patient has had bowel movements documented by nursing. Therefore, alvimopan has been discontinued. If there are questions, please contact the pharmacy at [phone].   Thank you-  Dia Sitter, PharmD, BCPS 06/15/2020 11:36 AM

## 2020-06-15 NOTE — Progress Notes (Signed)
3 Days Post-Op   Subjective/Chief Complaint: Still feels bloated but had another BM Not taking much po   Objective: Vital signs in last 24 hours: Temp:  [98 F (36.7 C)-98.7 F (37.1 C)] 98.7 F (37.1 C) (11/27 0558) Pulse Rate:  [74-82] 77 (11/27 0558) Resp:  [17-18] 17 (11/27 0558) BP: (121-128)/(69-98) 126/91 (11/27 0558) SpO2:  [95 %-97 %] 96 % (11/27 0558) Last BM Date: 06/15/20  Intake/Output from previous day: 11/26 0701 - 11/27 0700 In: 585 [P.O.:360; I.V.:225] Out: 1125 [Urine:1125] Intake/Output this shift: No intake/output data recorded.  Exam: Awake and alert Abdomen still distended on exam but not tender Wound clean. I removed the wick  Lab Results:  Recent Labs    06/14/20 0332 06/15/20 0548  WBC 8.8 9.4  HGB 13.6 13.3  HCT 37.3* 36.6*  PLT 147* 160   BMET Recent Labs    06/13/20 0442 06/13/20 0442 06/14/20 0332 06/15/20 0548  NA 137  --  133*  --   K 4.0   < > 3.6 3.7  CL 104  --  100  --   CO2 23  --  23  --   GLUCOSE 149*  --  162*  --   BUN 11  --  10  --   CREATININE 0.91   < > 0.96 0.91  CALCIUM 8.8*  --  8.9  --    < > = values in this interval not displayed.   PT/INR No results for input(s): LABPROT, INR in the last 72 hours. ABG No results for input(s): PHART, HCO3 in the last 72 hours.  Invalid input(s): PCO2, PO2  Studies/Results: DG Abd Portable 1V  Result Date: 06/14/2020 CLINICAL DATA:  Abdominal distention.  Post ileostomy reversal. EXAM: PORTABLE ABDOMEN - 1 VIEW COMPARISON:  CT 11/21/2019. FINDINGS: Hemidiaphragms not imaged. Distended loops of small large bowel noted suggesting adynamic ileus. Follow-up exams suggested to demonstrate resolution and to exclude bowel obstruction. Pelvic calcifications consistent phleboliths. Mild degenerative changes lumbar spine and both hips. IMPRESSION: Distended loops of small and large bowel noted suggesting adynamic ileus. Follow-up exams suggested to demonstrate resolution and  to exclude bowel obstruction. Electronically Signed   By: Marcello Moores  Register   On: 06/14/2020 07:25    Anti-infectives: Anti-infectives (From admission, onward)   Start     Dose/Rate Route Frequency Ordered Stop   06/13/20 1000  bictegravir-emtricitabine-tenofovir AF (BIKTARVY) 50-200-25 MG per tablet 1 tablet        1 tablet Oral Daily 06/12/20 1150     06/12/20 2030  cefoTEtan (CEFOTAN) 2 g in sodium chloride 0.9 % 100 mL IVPB        2 g 200 mL/hr over 30 Minutes Intravenous Every 12 hours 06/12/20 1150 06/12/20 2125   06/12/20 0645  cefoTEtan (CEFOTAN) 2 g in sodium chloride 0.9 % 100 mL IVPB        2 g 200 mL/hr over 30 Minutes Intravenous On call to O.R. 06/12/20 2130 06/12/20 0841      Assessment/Plan: s/p Procedure(s): ILEOSTOMY REVERSAL (N/A) REMOVAL PORT-A-CATH (Right)  Ileus resolving but not ready for discharge today Ok to shower Continue ambulation   LOS: 3 days    Coralie Keens MD 06/15/2020

## 2020-06-16 LAB — GLUCOSE, CAPILLARY
Glucose-Capillary: 100 mg/dL — ABNORMAL HIGH (ref 70–99)
Glucose-Capillary: 119 mg/dL — ABNORMAL HIGH (ref 70–99)
Glucose-Capillary: 116 mg/dL — ABNORMAL HIGH (ref 70–99)
Glucose-Capillary: 120 mg/dL — ABNORMAL HIGH (ref 70–99)

## 2020-06-16 LAB — HEMOGLOBIN A1C
Hgb A1c MFr Bld: 4.5 % — ABNORMAL LOW (ref 4.8–5.6)
Hgb A1c MFr Bld: 4.4 % — ABNORMAL LOW (ref 4.8–5.6)
Mean Plasma Glucose: 82.45 mg/dL
Mean Plasma Glucose: 79.58 mg/dL

## 2020-06-16 LAB — POTASSIUM: Potassium: 3.4 mmol/L — ABNORMAL LOW (ref 3.5–5.1)

## 2020-06-16 MED ORDER — CALCIUM POLYCARBOPHIL 625 MG PO TABS
625.0000 mg | ORAL_TABLET | Freq: Two times a day (BID) | ORAL | Status: DC
  Administered 2020-06-16 – 2020-06-17 (×3): 625 mg via ORAL
  Filled 2020-06-16 (×4): qty 1

## 2020-06-16 MED ORDER — ADULT MULTIVITAMIN W/MINERALS CH
1.0000 | ORAL_TABLET | Freq: Every day | ORAL | Status: DC
  Administered 2020-06-16 – 2020-06-17 (×2): 1 via ORAL
  Filled 2020-06-16 (×2): qty 1

## 2020-06-16 NOTE — Progress Notes (Signed)
Joshua Zhang [id number] 07-30-1970  CARE TEAM:  PCP: Heywood Bene, PA-C  Outpatient Care Team: Patient Care Team: Merwyn Katos as PCP - General (Physician Assistant) Michel Bickers, MD as Consulting Physician (Infectious Diseases) Leighton Ruff, MD as Consulting Physician (General Surgery) Truitt Merle, MD as Consulting Physician (Hematology) Kyung Rudd, MD as Consulting Physician (Radiation Oncology) Jonnie Finner, RN as Oncology Nurse Navigator  Inpatient Treatment Team: Treatment Team: Attending Provider: Leighton Ruff, MD; Technician: Ernest Mallick, NT; Registered Nurse: Sunny Schlein, RN; Utilization Review: Alease Medina, RN; Registered Nurse: Guadlupe Spanish, RN   Problem List:   Principal Problem:   Status post reversal of ileostomy Active Problems:   Rectal adenocarcinoma ([place])   HIV disease ([place])   Renal insufficiency   Ileostomy present ([place])   Neuropathy   Hypertension   History of chemotherapy   Impaired fasting glucose   Hyperglycemia   4 Days Post-Op  06/12/2020  POST-OPERATIVE DIAGNOSIS:  RECTAL CANCER  PROCEDURE:   ILEOSTOMY REVERSAL REMOVAL PORT-A-CATH  Surgeon(s): Leighton Ruff, MD  INDICATION: 49 year old male status post low anterior resection with coloanal anastomosis and diverting ileostomy.  He is completed his treatment for his rectal cancer and is ready for ileostomy reversal and port removal.  OR FINDINGS: Ileostomy in place, minimal abdominal adhesions.  Assessment  Ileus resolving  [facility] Stay = 4 days)  Plan:  Distention starting to go down  Advance to solid diet.  Bowel regimen  Follow electrolytes  Hyperglycemia.  Check hemoglobin A1c.  Sliding scale insulin.  Blood pressure control.  HIV positive.  Standard antiretroviral medicines.  Standing oral acetaminophen gabapentin pain control with as needed breakthrough.  Try and get him up mobilizing.  VTE  prophylaxis- SCDs, etc  Mobilize as tolerated to help recovery - much more active  D/C patient from hospital when patient meets criteria (anticipate in 1-2 day(s)):  Tolerating oral intake well Ambulating well Adequate pain control without IV medications Urinating  Having flatus Disposition planning in place   25 minutes spent in review, evaluation, examination, counseling, and coordination of care.  More than 50% of that time was spent in counseling.  06/16/2020    Subjective: (Chief complaint)  Patient had loose bowel movements and feeling much better.  Legs cramp.  Tolerating p.o. better.  Hungry.  Walking in hallways  Objective:  Vital signs:  Vitals:   06/15/20 0558 06/15/20 1418 06/15/20 2222 06/16/20 0508  BP: (!) 126/91 123/88 114/74 114/74  Pulse: 77 87 85 78  Resp: 17 17 20 18   Temp: 98.7 F (37.1 C) 98.1 F (36.7 C) 98.3 F (36.8 C) 98.3 F (36.8 C)  TempSrc: Oral Oral  Oral  SpO2: 96% 94% 96% 96%  Weight:      Height:        Last BM Date: 06/15/20  Intake/Output   Yesterday:  11/27 0701 - 11/28 0700 In: 730 [P.O.:730] Out: -  This shift:  No intake/output data recorded.  Bowel function:  Flatus: YES  BM:  YES  Drain: (No drain)   Physical Exam:  General: Pt awake/alert in no acute distress Eyes: PERRL, normal EOM.  Sclera clear.  No icterus Neuro: CN II-XII intact w/o focal sensory/motor deficits. Lymph: No head/neck/groin lymphadenopathy Psych:  No delerium/psychosis/paranoia.  Oriented x 4 HENT: Normocephalic, Mucus membranes moist.  No thrush Neck: Supple, No tracheal deviation.  No obvious thyromegaly Chest: No pain to chest wall compression.  Good respiratory excursion.  No audible wheezing  CV:  Pulses intact.  Regular rhythm.  No major extremity edema MS: Normal AROM mjr joints.  No obvious deformity  Abdomen: Soft.  Moderately distended.  Obese.    Nontender.  No evidence of peritonitis.  No incarcerated  hernias.  Ext:   No deformity.  No mjr edema.  No cyanosis Skin: No petechiae / purpurea.  No major sores.  Warm and dry    Results:   Cultures: Recent Results (from the past 720 hour(s))  SARS CORONAVIRUS 2 (TAT 6-24 HRS) Nasopharyngeal Nasopharyngeal Swab     Status: None   Collection Time: 06/08/20  1:31 PM   Specimen: Nasopharyngeal Swab  Result Value Ref Range Status   SARS Coronavirus 2 NEGATIVE NEGATIVE Final    Comment: (NOTE) SARS-CoV-2 target nucleic acids are NOT DETECTED.  The SARS-CoV-2 RNA is generally detectable in upper and lower respiratory specimens during the acute phase of infection. Negative results do not preclude SARS-CoV-2 infection, do not rule out co-infections with other pathogens, and should not be used as the sole basis for treatment or other patient management decisions. Negative results must be combined with clinical observations, patient history, and epidemiological information. The expected result is Negative.  Fact Sheet for Patients: SugarRoll.be  Fact Sheet for Healthcare Providers: https://www.woods-mathews.com/  This test is not yet approved or cleared by the [place] FDA and  has been authorized for detection and/or diagnosis of SARS-CoV-2 by FDA under an Emergency Use Authorization (EUA). This EUA will remain  in effect (meaning this test can be used) for the duration of the COVID-19 declaration under Se ction 564(b)(1) of the Act, 21 U.S.C. section 360bbb-3(b)(1), unless the authorization is terminated or revoked sooner.  Performed at [facility], [place] [address], [place], [place] [address]     Labs: Results for orders placed or performed during the hospital encounter of 06/12/20 (from the past 48 hour(s))  Glucose, capillary     Status: Abnormal   Collection Time: 06/14/20 12:13 PM  Result Value Ref Range   Glucose-Capillary 124 (H) 70 - 99 mg/dL    Comment: Glucose  reference range applies only to samples taken after fasting for at least 8 hours.  Glucose, capillary     Status: Abnormal   Collection Time: 06/14/20  5:57 PM  Result Value Ref Range   Glucose-Capillary 139 (H) 70 - 99 mg/dL    Comment: Glucose reference range applies only to samples taken after fasting for at least 8 hours.  Glucose, capillary     Status: Abnormal   Collection Time: 06/14/20 10:19 PM  Result Value Ref Range   Glucose-Capillary 139 (H) 70 - 99 mg/dL    Comment: Glucose reference range applies only to samples taken after fasting for at least 8 hours.  CBC     Status: Abnormal   Collection Time: 06/15/20  5:48 AM  Result Value Ref Range   WBC 9.4 4.0 - 10.5 K/uL   RBC 3.67 (L) 4.22 - 5.81 MIL/uL   Hemoglobin 13.3 13.0 - 17.0 g/dL   HCT 36.6 (L) 39 - 52 %   MCV 99.7 80.0 - 100.0 fL   MCH 36.2 (H) 26.0 - 34.0 pg   MCHC 36.3 (H) 30.0 - 36.0 g/dL   RDW 12.9 11.5 - 15.5 %   Platelets 160 150 - 400 K/uL   nRBC 0.0 0.0 - 0.2 %    Comment: Performed at [facility], [place] [address], [place], [place] [address]  Creatinine,  serum     Status: None   Collection Time: 06/15/20  5:48 AM  Result Value Ref Range   Creatinine, Ser 0.91 0.61 - 1.24 mg/dL   GFR, Estimated >60 >60 mL/min    Comment: (NOTE) Calculated using the CKD-EPI Creatinine Equation (2021) Performed at [facility], [place] [address], [place], [place] [address]   Potassium     Status: None   Collection Time: 06/15/20  5:48 AM  Result Value Ref Range   Potassium 3.7 3.5 - 5.1 mmol/L    Comment: Performed at [facility], [place] [address], [place], [place] [address]  Magnesium     Status: None   Collection Time: 06/15/20  5:48 AM  Result Value Ref Range   Magnesium 1.9 1.7 - 2.4 mg/dL    Comment: Performed at [facility], [place] [address], [place],  [address]  Glucose, capillary     Status: Abnormal   Collection Time: 06/15/20   7:27 AM  Result Value Ref Range   Glucose-Capillary 118 (H) 70 - 99 mg/dL    Comment: Glucose reference range applies only to samples taken after fasting for at least 8 hours.  Glucose, capillary     Status: Abnormal   Collection Time: 06/15/20 12:00 PM  Result Value Ref Range   Glucose-Capillary 130 (H) 70 - 99 mg/dL    Comment: Glucose reference range applies only to samples taken after fasting for at least 8 hours.  Glucose, capillary     Status: Abnormal   Collection Time: 06/15/20  5:01 PM  Result Value Ref Range   Glucose-Capillary 108 (H) 70 - 99 mg/dL    Comment: Glucose reference range applies only to samples taken after fasting for at least 8 hours.  Glucose, capillary     Status: Abnormal   Collection Time: 06/15/20 10:22 PM  Result Value Ref Range   Glucose-Capillary 121 (H) 70 - 99 mg/dL    Comment: Glucose reference range applies only to samples taken after fasting for at least 8 hours.   Comment 1 Notify RN   Glucose, capillary     Status: Abnormal   Collection Time: 06/16/20  7:40 AM  Result Value Ref Range   Glucose-Capillary 100 (H) 70 - 99 mg/dL    Comment: Glucose reference range applies only to samples taken after fasting for at least 8 hours.    Imaging / Studies: No results found.  Medications / Allergies: per chart  Antibiotics: Anti-infectives (From admission, onward)   Start     Dose/Rate Route Frequency Ordered Stop   06/13/20 1000  bictegravir-emtricitabine-tenofovir AF (BIKTARVY) 50-200-25 MG per tablet 1 tablet        1 tablet Oral Daily 06/12/20 1150     06/12/20 2030  cefoTEtan (CEFOTAN) 2 g in sodium chloride 0.9 % 100 mL IVPB        2 g 200 mL/hr over 30 Minutes Intravenous Every 12 hours 06/12/20 1150 06/12/20 2125   06/12/20 0645  cefoTEtan (CEFOTAN) 2 g in sodium chloride 0.9 % 100 mL IVPB        2 g 200 mL/hr over 30 Minutes Intravenous On call to O.R. 06/12/20 7371 06/12/20 0841        Note: Portions of this report may have been  transcribed using voice recognition software. Every effort was made to ensure accuracy; however, inadvertent computerized transcription errors may be present.   Any transcriptional errors that result from this process are unintentional.  Adin Hector, MD, FACS, MASCRS Gastrointestinal and Minimally Invasive Surgery  [facility] [address], [place], [place] [address] [phone] Fax [phone] Main/Paging  CONTACT INFORMATION: Weekday (9AM-5PM) concerns: Call [facility] main office at [phone] Weeknight (5PM-9AM) or Weekend/Holiday concerns: Check [email or web address] for General Surgery [facility] coverage (Please, do not use SecureChat as it is not reliable communication to operating surgeons for immediate patient care)      06/16/2020  8:43 AM

## 2020-06-17 ENCOUNTER — Encounter (HOSPITAL_COMMUNITY): Payer: Self-pay | Admitting: General Surgery

## 2020-06-17 LAB — GLUCOSE, CAPILLARY: Glucose-Capillary: 109 mg/dL — ABNORMAL HIGH (ref 70–99)

## 2020-06-17 MED ORDER — OXYCODONE HCL 5 MG PO TABS: 5.0000 mg | ORAL_TABLET | Freq: Four times a day (QID) | ORAL | 0 refills | Status: DC | PRN

## 2020-06-17 NOTE — Discharge Summary (Signed)
Physician Discharge Summary  Patient ID: SHLOIMY MICHALSKI MRN: [id number] DOB/AGE: 1971-07-19 49 y.o.  Admit date: 06/12/2020 Discharge date: 06/17/2020  Admission Diagnoses: ileostomy present  Discharge Diagnoses:  Principal Problem:   Status post reversal of ileostomy Active Problems:   HIV disease ([place])   Renal insufficiency   Rectal adenocarcinoma ([place])   Ileostomy present ([place])   Neuropathy   Hypertension   History of chemotherapy   Impaired fasting glucose   Hyperglycemia   Discharged Condition: good  Hospital Course: Pt developed ileus after surgery.  He was discharged once this resolved  Consults: None  Significant Diagnostic Studies: labs: cbc, bmet  Treatments: IV hydration, analgesia: acetaminophen and surgery: ileostomy reversal  Discharge Exam: Blood pressure 125/75, pulse 85, temperature 98.7 F (37.1 C), temperature source Oral, resp. rate 18, height 5\' 11"  (1.803 m), weight 97.7 kg, SpO2 95 %. General appearance: alert and cooperative GI: normal findings: soft, non-tender  Disposition: home   Allergies as of 06/17/2020   No Known Allergies     Medication List    STOP taking these medications   traMADol 50 MG tablet Commonly known as: ULTRAM     TAKE these medications   amLODipine 5 MG tablet Commonly known as: NORVASC Take 5 mg by mouth daily.   atorvastatin 10 MG tablet Commonly known as: LIPITOR Take 10 mg by mouth daily.   Biktarvy 50-200-25 MG Tabs tablet Generic drug: bictegravir-emtricitabine-tenofovir AF TAKE 1 TABLET BY MOUTH ONCE DAILY   losartan 25 MG tablet Commonly known as: COZAAR Take 25 mg by mouth daily.   multivitamin tablet Take 1 tablet by mouth daily.   oxyCODONE 5 MG immediate release tablet Commonly known as: Oxy IR/ROXICODONE Take 1 tablet (5 mg total) by mouth every 6 (six) hours as needed for moderate pain.       Follow-up Information    Leighton Ruff, MD. Schedule an appointment as soon as  possible for a visit in 2 weeks.   Specialty: General Surgery Contact information: [place] [address] [phone]               Signed: Rosario Adie 25/42/7062, 8:24 AM

## 2020-06-17 NOTE — Progress Notes (Signed)
D/C instructions given to patient. Patient had no questions. NT or writer will wheel patient out once he is dressed  

## 2020-06-17 NOTE — Progress Notes (Signed)
5 Days Post-Op ileostomy reversal Subjective: Having BM's.  Tolerated breakfast and lunch yesterday.  Ambulating well  Objective: Vital signs in last 24 hours: Temp:  [98.5 F (36.9 C)-99.4 F (37.4 C)] 98.7 F (37.1 C) (11/29 0615) Pulse Rate:  [85-95] 85 (11/29 0615) Resp:  [17-18] 18 (11/29 0615) BP: (124-142)/(75-90) 125/75 (11/29 0615) SpO2:  [95 %-98 %] 95 % (11/29 0615) Weight:  [97.7 kg] 97.7 kg (11/29 0500)   Intake/Output from previous day: 11/28 0701 - 11/29 0700 In: 1440 [P.O.:1440] Out: -  Intake/Output this shift: No intake/output data recorded.   General appearance: alert and cooperative  Incision: no significant drainage  Lab Results:  Recent Labs    06/15/20 0548  WBC 9.4  HGB 13.3  HCT 36.6*  PLT 160   BMET Recent Labs    06/15/20 0548 06/16/20 0913  K 3.7 3.4*  CREATININE 0.91  --    PT/INR No results for input(s): LABPROT, INR in the last 72 hours. ABG No results for input(s): PHART, HCO3 in the last 72 hours.  Invalid input(s): PCO2, PO2  MEDS, Scheduled  acetaminophen  500 mg Oral TID WC & HS   amLODipine  5 mg Oral Daily   atorvastatin  10 mg Oral Daily   bictegravir-emtricitabine-tenofovir AF  1 tablet Oral Daily   enoxaparin (LOVENOX) injection  40 mg Subcutaneous Q24H   feeding supplement  237 mL Oral 2 times per day   gabapentin  300 mg Oral TID   insulin aspart  0-15 Units Subcutaneous TID WC   insulin aspart  0-5 Units Subcutaneous QHS   lip balm  1 application Topical BID   losartan  25 mg Oral Daily   multivitamin with minerals  1 tablet Oral Daily   polycarbophil  625 mg Oral BID   sodium chloride flush  3 mL Intravenous Q12H    Studies/Results: No results found.  Assessment: s/p Procedure(s): ILEOSTOMY REVERSAL REMOVAL PORT-A-CATH Patient Active Problem List   Diagnosis Date Noted   Status post reversal of ileostomy 06/14/2020   Hyperglycemia 06/14/2020   Neuropathy    Hypertension     History of chemotherapy    Ileostomy present ([place]) 06/12/2020   Impaired fasting glucose 02/02/2020   Rectal adenocarcinoma ([place]) 08/21/2019   Injury of tendon of triceps 03/03/2019   Mixed hyperlipidemia 07/26/2018   Low back pain 09/29/2017   Essential hypertension 04/01/2017   Renal insufficiency 03/31/2017   Dyslipidemia 03/31/2017   HIV disease ([place]) 11/11/2016   Tinea versicolor 04/22/2016    Expected post op ileus  Plan: Discharge if continues to tolerate a diet this am   LOS: 5 days     .Rosario Adie, MD [facility], [place]    06/17/2020 8:21 AM

## 2020-06-18 ENCOUNTER — Other Ambulatory Visit: Payer: Self-pay | Admitting: Internal Medicine

## 2020-06-25 ENCOUNTER — Ambulatory Visit: Payer: BC Managed Care – PPO | Admitting: Internal Medicine

## 2020-06-25 ENCOUNTER — Encounter: Payer: Self-pay | Admitting: Internal Medicine

## 2020-06-25 ENCOUNTER — Other Ambulatory Visit: Payer: Self-pay

## 2020-06-25 DIAGNOSIS — B20 Human immunodeficiency virus [HIV] disease: Secondary | ICD-10-CM

## 2020-06-25 NOTE — Assessment & Plan Note (Signed)
He has had some decline in his CD4 count, most likely due to recent chemotherapy.  His viral load remains undetectable and overall his infection has been under very good control since starting treatment several years ago.  I suspect that he will have some CD4 reconstitution with time.  He will continue Biktarvy and follow-up after lab work in 6 months.  I encouraged him to go ahead and get his Covid booster.

## 2020-06-25 NOTE — Progress Notes (Signed)
Patient Active Problem List   Diagnosis Date Noted  . Rectal adenocarcinoma ([place]) 08/21/2019    Priority: High  . HIV disease ([place]) 11/11/2016    Priority: High  . Status post reversal of ileostomy 06/14/2020  . Hyperglycemia 06/14/2020  . Neuropathy   . Hypertension   . History of chemotherapy   . Ileostomy present ([place]) 06/12/2020  . Impaired fasting glucose 02/02/2020  . Injury of tendon of triceps 03/03/2019  . Mixed hyperlipidemia 07/26/2018  . Low back pain 09/29/2017  . Essential hypertension 04/01/2017  . Renal insufficiency 03/31/2017  . Dyslipidemia 03/31/2017  . Tinea versicolor 04/22/2016    Patient's Medications  New Prescriptions   No medications on file  Previous Medications   AMLODIPINE (NORVASC) 5 MG TABLET    Take 5 mg by mouth daily.    ATORVASTATIN (LIPITOR) 10 MG TABLET    Take 10 mg by mouth daily.    BICTEGRAVIR-EMTRICITABINE-TENOFOVIR AF (BIKTARVY) 50-200-25 MG TABS TABLET    Take 1 tablet by mouth daily.   LOSARTAN (COZAAR) 25 MG TABLET    Take 25 mg by mouth daily.    MULTIPLE VITAMIN (MULTIVITAMIN) TABLET    Take 1 tablet by mouth daily.   OXYCODONE (OXY IR/ROXICODONE) 5 MG IMMEDIATE RELEASE TABLET    Take 1 tablet (5 mg total) by mouth every 6 (six) hours as needed for moderate pain.  Modified Medications   No medications on file  Discontinued Medications   No medications on file    Subjective: Joshua Zhang is in with his wife, Joshua Zhang, for his routine HIV follow-up visit.  He recently completed chemotherapy for rectal carcinoma and had his ileostomy reversed.  He is feeling better.  He has not been released to return to work or start exercising yet.  He has not had any problems obtaining, taking or tolerating his Biktarvy and does not recall missing doses.  He had his initial 2 Covid vaccines in May and has not scheduled his booster dose yet.  He has received his influenza vaccine.  Review of Systems: Review of Systems  Constitutional:  Negative for fever and weight loss.  Psychiatric/Behavioral: Negative for depression. The patient is not nervous/anxious.     Past Medical History:  Diagnosis Date  . History of chemotherapy   . HIV (human immunodeficiency virus infection) ([place])   . Hypertension   . 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 Port-A-Cath in place 10/12/2019  . rectal ca dx'd 06/2019    Social History   Tobacco Use  . Smoking status: Former Research scientist (life sciences)  . Smokeless tobacco: Former Systems developer  . Tobacco comment: Quit 18 years ago  Vaping Use  . Vaping Use: Never used  Substance Use Topics  . Alcohol use: Yes    Comment: 5-6 driniks per week   . Drug use: Not Currently    Family History  Problem Relation Age of Onset  . Breast cancer Mother   . Aortic aneurysm Father     No Known Allergies  Health Maintenance  Topic Date Due  . COLONOSCOPY  Never done  . INFLUENZA VACCINE  02/18/2020  . TETANUS/TDAP  10/22/2022  . COVID-19 Vaccine  Completed  . Hepatitis C Screening  Completed  . HIV Screening  Completed    Objective:  Vitals:   06/25/20 1617  BP: 138/84  Pulse: 82  Temp: 97.9 F (36.6 C)  TempSrc: Oral  SpO2: 99%  Weight:  206 lb (93.4 kg)   Body mass index is 28.73 kg/m.  Physical Exam Constitutional:      Comments: He is in good spirits.  Cardiovascular:     Rate and Rhythm: Normal rate.  Pulmonary:     Effort: Pulmonary effort is normal.  Psychiatric:        Mood and Affect: Mood normal.     Lab Results Lab Results  Component Value Date   WBC 9.4 06/15/2020   HGB 13.3 06/15/2020   HCT 36.6 (L) 06/15/2020   MCV 99.7 06/15/2020   PLT 160 06/15/2020    Lab Results  Component Value Date   CREATININE 0.91 06/15/2020   BUN 10 06/14/2020   NA 133 (L) 06/14/2020   K 3.4 (L) 06/16/2020   CL 100 06/14/2020   CO2 23 06/14/2020    Lab Results  Component Value Date   ALT 98 (H) 05/03/2020   AST 68 (H) 05/03/2020   ALKPHOS 177 (H) 05/03/2020   BILITOT 1.2  05/03/2020    Lab Results  Component Value Date   CHOL 173 08/23/2019   HDL 43 08/23/2019   LDLCALC 105 (H) 08/23/2019   TRIG 139 08/23/2019   CHOLHDL 4.0 08/23/2019   Lab Results  Component Value Date   LABRPR NON-REACTIVE 08/23/2019   HIV 1 RNA Quant  Date Value  06/07/2020 <20 Copies/mL (H)  10/09/2019 39 copies/mL (H)  08/23/2019 22 copies/mL (H)   CD4 T Cell Abs (/uL)  Date Value  06/07/2020 294 (L)  10/09/2019 708  08/23/2019 562     Problem List Items Addressed This Visit      High   HIV disease ([place])    He has had some decline in his CD4 count, most likely due to recent chemotherapy.  His viral load remains undetectable and overall his infection has been under very good control since starting treatment several years ago.  I suspect that he will have some CD4 reconstitution with time.  He will continue Biktarvy and follow-up after lab work in 6 months.  I encouraged him to go ahead and get his Covid booster.      Relevant Orders   T-helper cell (CD4)- ([facility] clinic only)   HIV-1 RNA quant-no reflex-bld   CBC   Comprehensive metabolic panel   Lipid panel   RPR        Michel Bickers, MD [facility] for [place] [phone] pager   [phone] cell 06/25/2020, 4:51 PM

## 2020-06-27 MED FILL — BIKTARVY 50-200-25 MG TABS: 50-200-25 | 30 days supply | Qty: 30 | Fill #0

## 2020-07-12 DIAGNOSIS — Z03818 Encounter for observation for suspected exposure to other biological agents ruled out: Secondary | ICD-10-CM | POA: Diagnosis not present

## 2020-07-12 DIAGNOSIS — Z20822 Contact with and (suspected) exposure to covid-19: Secondary | ICD-10-CM | POA: Diagnosis not present

## 2020-07-29 ENCOUNTER — Ambulatory Visit: Payer: Self-pay | Admitting: General Surgery

## 2020-07-29 NOTE — H&P (Signed)
The patient is a 50 year old male who presents with colorectal cancer. 50 year old male who presented to my office with a new diagnosis of rectal cancer in late 2020. This was seen on screening colonoscopy. Patient has noticed rectal bleeding for the past 2 years. He denied any weight loss or changes in bowel habits. Colonoscopy revealed multiple polyps throughout the colon and a lesion in the distal rectum. Biopsies confirm adenocarcinoma within the rectal lesion. CT scans showed no signs of metastatic disease. MRI showed a distal rectal lesion which appeared to be abutting the internal sphincter complex. CEA was 2.5. He has underwent total neoadjuvant chemoradiation treatment. He completed his final dose of radiation on July 15. He then underwent an uncomplicated robotic-assisted low anterior resection with coloanal anastomosis and diverting ileostomy on April 03, 2020. He then and underwent ileostomy reversal in late November 2021. He is here today for follow-up. He is tolerating a diet and having good bowel function. He is having approximately 3-6 bowel movements a day. He is taking fiber pills on a daily basis in the morning and at night. He is getting back to work activities.   Problem List/Past Medical Leighton Ruff, MD; 2/99/3716 4:50 PM) RECTAL CANCER (C20) INCISIONAL HERNIA, WITHOUT OBSTRUCTION OR GANGRENE (K43.2)  Past Surgical History Leighton Ruff, MD; 9/67/8938 4:50 PM) Colon Polyp Removal - Colonoscopy Shoulder Surgery Left.  Diagnostic Studies History Leighton Ruff, MD; 07/20/7508 4:50 PM) Colonoscopy within last year  Allergies Leighton Ruff, MD; 2/58/5277 4:50 PM) No Known Drug Allergies [08/15/2019]:  Medication History Andreas Blower, [place]; 07/29/2020 4:24 PM) amLODIPine Besy-Benazepril HCl (10-20MG  Capsule, Oral) Active. Atorvastatin Calcium (10MG  Tablet, Oral) Active. Biktarvy (50-200-25MG  Tablet, Oral) Active. Multiple Vitamin (1 (one)  Oral) Active. Medications Reconciled  Social History Leighton Ruff, MD; 03/12/2352 4:50 PM) Alcohol use Moderate alcohol use. Caffeine use Coffee. No drug use Tobacco use Former smoker.  Family History Leighton Ruff, MD; 12/31/4313 4:50 PM) Breast Cancer Mother. Heart Disease Father.  Other Problems Leighton Ruff, MD; 4/00/8676 4:50 PM) High blood pressure HIV-positive Hypercholesterolemia     Review of Systems Leighton Ruff MD; 1/95/0932 4:50 PM) General Present- Fatigue and Weight Gain. Not Present- Appetite Loss, Chills, Fever, Night Sweats and Weight Loss. Skin Present- Dryness. Not Present- Change in Wart/Mole, Hives, Jaundice, New Lesions, Non-Healing Wounds, Rash and Ulcer. HEENT Present- Seasonal Allergies. Not Present- Earache, Hearing Loss, Hoarseness, Nose Bleed, Oral Ulcers, Ringing in the Ears, Sinus Pain, Sore Throat, Visual Disturbances, Wears glasses/contact lenses and Yellow Eyes. Respiratory Not Present- Bloody sputum, Chronic Cough, Difficulty Breathing, Snoring and Wheezing. Breast Not Present- Breast Mass, Breast Pain, Nipple Discharge and Skin Changes. Cardiovascular Not Present- Chest Pain, Difficulty Breathing Lying Down, Leg Cramps, Palpitations, Rapid Heart Rate, Shortness of Breath and Swelling of Extremities. Gastrointestinal Present- Bloody Stool, Change in Bowel Habits and Chronic diarrhea. Not Present- Abdominal Pain, Bloating, Constipation, Difficulty Swallowing, Excessive gas, Gets full quickly at meals, Hemorrhoids, Indigestion, Nausea, Rectal Pain and Vomiting. Male Genitourinary Not Present- Blood in Urine, Change in Urinary Stream, Frequency, Impotence, Nocturia, Painful Urination, Urgency and Urine Leakage. Musculoskeletal Present- Back Pain. Not Present- Joint Pain, Joint Stiffness, Muscle Pain, Muscle Weakness and Swelling of Extremities. Neurological Not Present- Decreased Memory, Fainting, Headaches, Numbness, Seizures, Tingling,  Tremor, Trouble walking and Weakness. Psychiatric Not Present- Anxiety, Bipolar, Change in Sleep Pattern, Depression, Fearful and Frequent crying. Endocrine Not Present- Cold Intolerance, Excessive Hunger, Hair Changes, Heat Intolerance and New Diabetes. Hematology Present- HIV. Not Present- Blood Thinners, Easy Bruising, Excessive bleeding,  Gland problems and Persistent Infections.  Vitals (Armen Ferguson CMA; 07/29/2020 4:24 PM) 07/29/2020 4:24 PM Weight: 207 lb Height: 70in Body Surface Area: 2.12 m Body Mass Index: 29.7 kg/m  Temp.: 98.73F  Pulse: 85 (Regular)  P.OX: 99% (Room air)        Physical Exam Leighton Ruff MD; 5/36/6440 4:50 PM)  General Mental Status-Alert. General Appearance-Cooperative.  Abdomen Palpation/Percussion Palpation and Percussion of the abdomen reveal - Note: Approximately 4 cm incisional hernia noted at ileostomy site, completely reducible.    Assessment & Plan Leighton Ruff MD; 3/47/4259 4:49 PM)  .   INCISIONAL HERNIA, WITHOUT OBSTRUCTION OR GANGRENE (K43.2) Impression: Patient appears to be developing an incisional hernia at his ileostomy site. I have recommended laparoscopic hernia repair. We have discussed this in detail including risk of infection, postoperative pain and recurrence. All questions were answered. Patient would like to go ahead and get this done in the near future.

## 2020-07-30 MED FILL — BIKTARVY 50-200-25 MG TABS: 50-200-25 | 30 days supply | Qty: 30 | Fill #1

## 2020-07-31 NOTE — Progress Notes (Signed)
[facility]   [phone] Fax:([phone]   Clinic Follow up Note   Patient Care Team: Merwyn Katos as PCP - General (Physician Assistant) Michel Bickers, MD as Consulting Physician (Infectious Diseases) Leighton Ruff, MD as Consulting Physician (General Surgery) Truitt Merle, MD as Consulting Physician (Hematology) Kyung Rudd, MD as Consulting Physician (Radiation Oncology) Jonnie Finner, RN as Oncology Nurse Navigator  Date of Service:  08/02/2020  CHIEF COMPLAINT: F/u of rectal cancer  SUMMARY OF ONCOLOGIC HISTORY: Oncology History Overview Note  Cancer Staging Rectal adenocarcinoma [facility]) Staging form: Colon and Rectum, AJCC 8th Edition - Clinical stage from 08/21/2019: Stage IIIB (cT3, cN1, cM0) - Signed by Truitt Merle, MD on 08/21/2019    Rectal adenocarcinoma ([place])  07/03/2019 Procedure   Colonoscopy by Dr. Collene Mares  IMPRESSION -Likely Malignant 5cm, circumferential, bleeding tumor in the rectum, 304 cm from the anal verge -biopsy done  -One 8 mm sessile polyp in the distal sigmoid colon, removed with a hot snare x2; resected and retrieved.  -Two small sessile polyps, 1 in the mid-descending colon and 1 in the mid transverse colon - removed by cold biopsies.  -One 84mm sessile polyp in the mid transverse colon, removed with a hot snare x1; resected and retrieved.  -One 24mm sessile polyp in the proximal ascending colon, removed with a hot snare X1; resected and retrieved.  -few large scattered diverticula.   07/03/2019 Initial Biopsy   FINAL DIAGNOSIS 07/03/19  A. Colon, Descending, Polyp, Polypectomy:   -TUBULAR ADENOMA   -No high grade dysplasia or malignancy.  B. Colon, transverse, polyp, polypectomy:   -TUBULAR ADENOMA  -No high grade dysplasia or malignancy. C. Colon, Ascending, Polyp, Polypectomy:   -Fragments of sessile serrated adenoma/polyp D. Colon, sigmoid, polyp, polypectomy:   -Fragments of Tubular Adenoma  -No  high grade dysplasia or malignancy. E. Rectum, Mass, Biopsy:   -INVASIVE COLONIC ADENOCARCINOMA, MODERATELY-DIFFERENTIATED, see comment     07/12/2019 Imaging   CT AP W Contrast 07/12/19  IMPRESSION: 1. No evidence of metastatic disease within the chest, abdomen or pelvis. 2. Possible mild wall thickening in the sigmoid colon, although no focal colonic mass is identified. There are few distal colonic diverticula. 3. Sequela of prior granulomatous disease. 4. Possible cholelithiasis with mild wall thickening of the gallbladder fundus. 5. Aortic Atherosclerosis (ICD10-I70.0).   08/21/2019 Initial Diagnosis   Rectal adenocarcinoma ([place])   08/21/2019 Cancer Staging   Staging form: Colon and Rectum, AJCC 8th Edition - Clinical stage from 08/21/2019: Stage IIIB (cT3, cN1, cM0) - Signed by Truitt Merle, MD on 08/21/2019   08/31/2019 - 12/07/2019 Chemotherapy   FOLFOX q2weeks starting 08/31/19. Removed 5FU bolus and increased steroids pre-meds due to thrombocytopenia and skin rash starting with C4. Completed 12/07/19    11/21/2019 Imaging   CT AP W contrast  IMPRESSION: 1. Stable exam. No evidence for metastatic disease within the abdomen or pelvis. 2. Splenomegaly.  New from previous exam. 3. Gallstones.   Aortic Atherosclerosis (ICD10-I70.0).   12/25/2019 - 02/01/2020 Chemotherapy   Concurrent chemoRT with Xeloda $RemoveBef'2000mg'cYDxgiuUBK$  in the AM and $Remo'1500mg'dpptY$  in the PM on days of RT  12/25/19-7/15/2   12/25/2019 - 02/01/2020 Radiation Therapy   Concurrent  chemoRT with Dr Lisbeth Renshaw 12/25/19-02/01/20   04/03/2020 Surgery   XI ROBOTIC ASSISTED LOWER ANTERIOR RESECTION, SPLENIC FLEXURE MOBILIZATION, RIGID PROCTOSCOPY and DIVERTING LOOP ILEOSTOMY with Dr Malcolm Metro and Dr Johney Maine   04/03/2020 Pathology Results   FINAL MICROSCOPIC DIAGNOSIS:   A. RECTOSIGMOID COLON, LOW  ANTERIOR RESECTION:  - No residual invasive carcinoma status post neoadjuvant treatment.  - See oncology table.   B. FINAL DISTAL MARGIN, EXCISION:  -  Unremarkable colonic mucosa.    06/12/2020 Surgery   ILEOSTOMY REVERSAL and PAC removal by Dr Marcello Moores       CURRENT THERAPY:  Surveillance   INTERVAL HISTORY:  Joshua Zhang is here for a follow up. He presents to the clinic alone. He notes he is doing well. He notes his ileostomy take down went well but has hernia. He plans to have hernia repair in a few months. He notes his Bm are improving with fiber pills. He denies blood in stool except when he has multiple BM. He notes he recently started imodium. He denies any pain or breathing issues.  He notes the only neuropathy he has is numbness in his toes. He denies balance issues. He notes he goal weight is 190 pounds and is starting a diet next week to work on that.   He plans to get his COVID booster next week.     REVIEW OF SYSTEMS:   Constitutional: Denies fevers, chills or abnormal weight loss Eyes: Denies blurriness of vision Ears, nose, mouth, throat, and face: Denies mucositis or sore throat Respiratory: Denies cough, dyspnea or wheezes Cardiovascular: Denies palpitation, chest discomfort or lower extremity swelling Gastrointestinal:  Denies nausea, heartburn (+) frequent BM Skin: Denies abnormal skin rashes Lymphatics: Denies new lymphadenopathy or easy bruising Neurological: (+) Numbness in toes  Behavioral/Psych: Mood is stable, no new changes  All other systems were reviewed with the patient and are negative.  MEDICAL HISTORY:  Past Medical History:  Diagnosis Date  . History of chemotherapy   . HIV (human immunodeficiency virus infection) ([place])   . Hypertension   . 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 Port-A-Cath in place 10/12/2019  . rectal ca dx'd 06/2019    SURGICAL HISTORY: Past Surgical History:  Procedure Laterality Date  . APPENDECTOMY     50 years old   . DIVERTING ILEOSTOMY N/A 04/03/2020   Procedure: DIVERTING LOOP ILEOSTOMY;  Surgeon: Leighton Ruff, MD;  Location: [facility];   Service: General;  Laterality: N/A;  . FINGER SURGERY Right   . ILEOSTOMY CLOSURE N/A 06/12/2020   Procedure: ILEOSTOMY REVERSAL;  Surgeon: Leighton Ruff, MD;  Location: [facility];  Service: General;  Laterality: N/A;  . IR IMAGING GUIDED PORT INSERTION  08/30/2019  . PORT-A-CATH REMOVAL Right 06/12/2020   Procedure: REMOVAL PORT-A-CATH;  Surgeon: Leighton Ruff, MD;  Location: [facility];  Service: General;  Laterality: Right;  . SHOULDER SURGERY Bilateral 50 years old  . WISDOM TOOTH EXTRACTION    . XI ROBOTIC ASSISTED LOWER ANTERIOR RESECTION N/A 04/03/2020   Procedure: XI ROBOTIC ASSISTED LOWER ANTERIOR RESECTION, SPLENIC FLEXURE MOBILIZATION, RIGID PROCTOSCOPY;  Surgeon: Leighton Ruff, MD;  Location: [facility];  Service: General;  Laterality: N/A;    I have reviewed the social history and family history with the patient and they are unchanged from previous note.  ALLERGIES:  has No Known Allergies.  MEDICATIONS:  Current Outpatient Medications  Medication Sig Dispense Refill  . amLODipine (NORVASC) 5 MG tablet Take 5 mg by mouth daily.     Marland Kitchen atorvastatin (LIPITOR) 10 MG tablet Take 10 mg by mouth daily.     . bictegravir-emtricitabine-tenofovir AF (BIKTARVY) 50-200-25 MG TABS tablet Take 1 tablet by mouth daily. 30 tablet 1  . losartan (COZAAR) 25 MG tablet Take 25 mg  by mouth daily.     . Multiple Vitamin (MULTIVITAMIN) tablet Take 1 tablet by mouth daily.     No current facility-administered medications for this visit.    PHYSICAL EXAMINATION: ECOG PERFORMANCE STATUS: 0 - Asymptomatic  Vitals:   08/02/20 0839  BP: (!) 140/95  Pulse: 78  Resp: 14  Temp: 97.8 F (36.6 C)  SpO2: 100%   Filed Weights   08/02/20 0839  Weight: 206 lb 9.6 oz (93.7 kg)    GENERAL:alert, no distress and comfortable SKIN: skin color, texture, turgor are normal, no rashes or significant lesions EYES: normal, Conjunctiva are pink and non-injected, sclera clear  NECK: supple, thyroid normal size,  non-tender, without nodularity LYMPH:  no palpable lymphadenopathy in the cervical, axillary or inguinal LUNGS: clear to auscultation and percussion with normal breathing effort HEART: regular rate & rhythm and no murmurs and no lower extremity edema ABDOMEN:abdomen soft, non-tender and normal bowel sounds (+) Surgical incisions healed well (+) Mild hernia at prior ileostomy site Musculoskeletal:no cyanosis of digits and no clubbing  NEURO: alert & oriented x 3 with fluent speech, no focal motor/sensory deficits RECTAL: No palpable mass. No blood on glove. Normal stool present. Benign exam    LABORATORY DATA:  I have reviewed the data as listed CBC Latest Ref Rng & Units 08/02/2020 06/15/2020 06/14/2020  WBC 4.0 - 10.5 K/uL 4.8 9.4 8.8  Hemoglobin 13.0 - 17.0 g/dL 14.4 13.3 13.6  Hematocrit 39.0 - 52.0 % 39.4 36.6(L) 37.3(L)  Platelets 150 - 400 K/uL 171 160 147(L)     CMP Latest Ref Rng & Units 08/02/2020 06/16/2020 06/15/2020  Glucose 70 - 99 mg/dL 108(H) - -  BUN 6 - 20 mg/dL 16 - -  Creatinine 0.61 - 1.24 mg/dL 0.98 - 0.91  Sodium 135 - 145 mmol/L 141 - -  Potassium 3.5 - 5.1 mmol/L 3.9 3.4(L) 3.7  Chloride 98 - 111 mmol/L 106 - -  CO2 22 - 32 mmol/L 24 - -  Calcium 8.9 - 10.3 mg/dL 9.3 - -  Total Protein 6.5 - 8.1 g/dL 7.2 - -  Total Bilirubin 0.3 - 1.2 mg/dL 1.5(H) - -  Alkaline Phos 38 - 126 U/L 102 - -  AST 15 - 41 U/L 33 - -  ALT 0 - 44 U/L 42 - -      RADIOGRAPHIC STUDIES: I have personally reviewed the radiological images as listed and agreed with the findings in the report. No results found.   ASSESSMENT & PLAN:  Joshua Zhang is a 50 y.o. male with   1.Low rectaladenocarcinoma,cT3N1M0,Stage IIIB, MMR normal, ypT0N0 -This was discovered on screening colonoscopyin 06/2019 with low rectum mass close to sphincter. Biopsy showed this is moderately-differentiated adenocarcinoma locally advanced. Based on MRI, this is cT3N1 stage III disease.  -He completed  8 cycles oftotal neoadjuvant chemotherapywithFOLFOX and concurrentchemoRTwith Xeloda.  -He underwent surgery with Dr Marcello Moores on 04/04/11.  Overall he had complete response to chemo/RT. I do not plan to give adjuvant treatment. He is currently on surveillance. -He underwent ileostomy takedown on 06/12/20. He has hernia now and will have repair surgery in a few months.  -He is clinically doing well. He still has numbness in his toes. I reviewed management with oral B12, exercise and staying warm. He is managing BM with fiber and imodium. He plans to work on weight loss soon. Labs reviewed, CBC and CMP WNL. Physical exam including rectal exam unremarkable.  -He is 1 year since his cancer diagnosis.  He will proceed with surveillance colonoscopy in early 2022. Next scan in May 2022.  -F/u in 4 moths with next scan. He can space out his appointment with mine and Dr Marcello Moores.    2. HIV, controlled -He denies h/o or current use of recreational or IV drugs. He is unsure how he initially got infected.  -Controlled and undetectable. 09/15/18 CD4 was 350.His3/22/21 CD4 T cells is 708 -He is on Biktavy. This is managed by Dr. Megan Salon. -He has received both his COVID19 vaccines. I encouraged him to receive COVID booster   3. HLD, HTN -He will continue medications and f/u with PCP  4. Thrombocytopenia  -Secondary to chemo FOLFOX -resolved recently.    PLAN: -F/u in 4 months with lab and CT CAP w contrast a few days before    No problem-specific Assessment & Plan notes found for this encounter.   Orders Placed This Encounter  Procedures  . CT CHEST ABDOMEN PELVIS W CONTRAST    Standing Status:   Future    Standing Expiration Date:   08/02/2021    Order Specific Question:   If indicated for the ordered procedure, I authorize the administration of contrast media per Radiology protocol    Answer:   Yes    Order Specific Question:   Preferred imaging location?    Answer:   [facility]    Order Specific Question:   Release to patient    Answer:   Immediate    Order Specific Question:   Is Oral Contrast requested for this exam?    Answer:   Yes, Per Radiology protocol    Order Specific Question:   Reason for Exam (SYMPTOM  OR DIAGNOSIS REQUIRED)    Answer:   rule out cancer recurrence   All questions were answered. The patient knows to call the clinic with any problems, questions or concerns. No barriers to learning was detected. The total time spent in the appointment was 30 minutes.     Truitt Merle, MD 08/02/2020   I, Joslyn Devon, am acting as scribe for Truitt Merle, MD.   I have reviewed the above documentation for accuracy and completeness, and I agree with the above.

## 2020-08-02 ENCOUNTER — Inpatient Hospital Stay: Payer: BC Managed Care – PPO | Attending: Hematology | Admitting: Hematology

## 2020-08-02 ENCOUNTER — Other Ambulatory Visit: Payer: Self-pay

## 2020-08-02 ENCOUNTER — Inpatient Hospital Stay: Payer: BC Managed Care – PPO

## 2020-08-02 ENCOUNTER — Encounter: Payer: Self-pay | Admitting: Hematology

## 2020-08-02 VITALS — BP 140/95 | HR 78 | Temp 97.8°F | Resp 14 | Ht 71.0 in | Wt 206.6 lb

## 2020-08-02 DIAGNOSIS — I10 Essential (primary) hypertension: Secondary | ICD-10-CM | POA: Diagnosis not present

## 2020-08-02 DIAGNOSIS — D123 Benign neoplasm of transverse colon: Secondary | ICD-10-CM | POA: Insufficient documentation

## 2020-08-02 DIAGNOSIS — Z9221 Personal history of antineoplastic chemotherapy: Secondary | ICD-10-CM | POA: Insufficient documentation

## 2020-08-02 DIAGNOSIS — R161 Splenomegaly, not elsewhere classified: Secondary | ICD-10-CM | POA: Insufficient documentation

## 2020-08-02 DIAGNOSIS — D696 Thrombocytopenia, unspecified: Secondary | ICD-10-CM | POA: Insufficient documentation

## 2020-08-02 DIAGNOSIS — G629 Polyneuropathy, unspecified: Secondary | ICD-10-CM | POA: Insufficient documentation

## 2020-08-02 DIAGNOSIS — D122 Benign neoplasm of ascending colon: Secondary | ICD-10-CM | POA: Diagnosis not present

## 2020-08-02 DIAGNOSIS — Z79899 Other long term (current) drug therapy: Secondary | ICD-10-CM | POA: Diagnosis not present

## 2020-08-02 DIAGNOSIS — Z9049 Acquired absence of other specified parts of digestive tract: Secondary | ICD-10-CM | POA: Insufficient documentation

## 2020-08-02 DIAGNOSIS — C20 Malignant neoplasm of rectum: Secondary | ICD-10-CM

## 2020-08-02 DIAGNOSIS — E785 Hyperlipidemia, unspecified: Secondary | ICD-10-CM | POA: Insufficient documentation

## 2020-08-02 DIAGNOSIS — D125 Benign neoplasm of sigmoid colon: Secondary | ICD-10-CM | POA: Diagnosis not present

## 2020-08-02 DIAGNOSIS — Z21 Asymptomatic human immunodeficiency virus [HIV] infection status: Secondary | ICD-10-CM | POA: Diagnosis not present

## 2020-08-02 DIAGNOSIS — D124 Benign neoplasm of descending colon: Secondary | ICD-10-CM | POA: Insufficient documentation

## 2020-08-02 DIAGNOSIS — I700 Atherosclerosis of aorta: Secondary | ICD-10-CM | POA: Insufficient documentation

## 2020-08-02 LAB — CBC WITH DIFFERENTIAL (CANCER CENTER ONLY)
Abs Immature Granulocytes: 0.01 10*3/uL (ref 0.00–0.07)
Basophils Absolute: 0 10*3/uL (ref 0.0–0.1)
Basophils Relative: 0 %
Eosinophils Absolute: 0.1 10*3/uL (ref 0.0–0.5)
Eosinophils Relative: 3 %
HCT: 39.4 % (ref 39.0–52.0)
Hemoglobin: 14.4 g/dL (ref 13.0–17.0)
Immature Granulocytes: 0 %
Lymphocytes Relative: 22 %
Lymphs Abs: 1.1 10*3/uL (ref 0.7–4.0)
MCH: 35.6 pg — ABNORMAL HIGH (ref 26.0–34.0)
MCHC: 36.5 g/dL — ABNORMAL HIGH (ref 30.0–36.0)
MCV: 97.5 fL (ref 80.0–100.0)
Monocytes Absolute: 0.4 10*3/uL (ref 0.1–1.0)
Monocytes Relative: 8 %
Neutro Abs: 3.2 10*3/uL (ref 1.7–7.7)
Neutrophils Relative %: 67 %
Platelet Count: 171 10*3/uL (ref 150–400)
RBC: 4.04 MIL/uL — ABNORMAL LOW (ref 4.22–5.81)
RDW: 12.1 % (ref 11.5–15.5)
WBC Count: 4.8 10*3/uL (ref 4.0–10.5)
nRBC: 0 % (ref 0.0–0.2)

## 2020-08-02 LAB — CMP (CANCER CENTER ONLY)
ALT: 42 U/L (ref 0–44)
AST: 33 U/L (ref 15–41)
Albumin: 4 g/dL (ref 3.5–5.0)
Alkaline Phosphatase: 102 U/L (ref 38–126)
Anion gap: 11 (ref 5–15)
BUN: 16 mg/dL (ref 6–20)
CO2: 24 mmol/L (ref 22–32)
Calcium: 9.3 mg/dL (ref 8.9–10.3)
Chloride: 106 mmol/L (ref 98–111)
Creatinine: 0.98 mg/dL (ref 0.61–1.24)
GFR, Estimated: >60 mL/min (ref >60)
Glucose, Bld: 108 mg/dL — ABNORMAL HIGH (ref 70–99)
Potassium: 3.9 mmol/L (ref 3.5–5.1)
Sodium: 141 mmol/L (ref 135–145)
Total Bilirubin: 1.5 mg/dL — ABNORMAL HIGH (ref 0.3–1.2)
Total Protein: 7.2 g/dL (ref 6.5–8.1)

## 2020-08-05 ENCOUNTER — Telehealth: Payer: Self-pay | Admitting: Hematology

## 2020-08-05 NOTE — Telephone Encounter (Signed)
Scheduled appts per 1/14 los. Pt's spouse confirmed appt date and time.

## 2020-08-20 DIAGNOSIS — Z1211 Encounter for screening for malignant neoplasm of colon: Secondary | ICD-10-CM | POA: Diagnosis not present

## 2020-08-20 DIAGNOSIS — Z85048 Personal history of other malignant neoplasm of rectum, rectosigmoid junction, and anus: Secondary | ICD-10-CM | POA: Diagnosis not present

## 2020-08-20 DIAGNOSIS — Z86010 Personal history of colonic polyps: Secondary | ICD-10-CM | POA: Diagnosis not present

## 2020-08-20 DIAGNOSIS — K432 Incisional hernia without obstruction or gangrene: Secondary | ICD-10-CM | POA: Diagnosis not present

## 2020-08-22 ENCOUNTER — Other Ambulatory Visit: Payer: Self-pay | Admitting: Internal Medicine

## 2020-08-27 MED FILL — BIKTARVY 50-200-25 MG TABS: 50-200-25 | 30 days supply | Qty: 30 | Fill #0

## 2020-09-06 ENCOUNTER — Encounter: Payer: Self-pay | Admitting: Hematology

## 2020-09-06 DIAGNOSIS — Z85048 Personal history of other malignant neoplasm of rectum, rectosigmoid junction, and anus: Secondary | ICD-10-CM | POA: Diagnosis not present

## 2020-09-06 DIAGNOSIS — K5730 Diverticulosis of large intestine without perforation or abscess without bleeding: Secondary | ICD-10-CM | POA: Diagnosis not present

## 2020-09-06 DIAGNOSIS — Z1211 Encounter for screening for malignant neoplasm of colon: Secondary | ICD-10-CM | POA: Diagnosis not present

## 2020-09-17 NOTE — Patient Instructions (Addendum)
DUE TO COVID-19 ONLY ONE VISITOR IS ALLOWED TO COME WITH YOU AND STAY IN THE WAITING ROOM ONLY DURING PRE OP AND PROCEDURE DAY OF SURGERY. THE 1 VISITOR  MAY VISIT WITH YOU AFTER SURGERY IN YOUR PRIVATE ROOM DURING VISITING HOURS ONLY!  YOU NEED TO HAVE A COVID 19 TEST ON__3/7_____ @_3 :00______, THIS TEST MUST BE DONE BEFORE SURGERY,  COVID TESTING SITE [place]  [id number], IT IS ON THE RIGHT GOING OUT [address] APPROXIMATELY  2 MINUTES PAST [employer] ON THE RIGHT. ONCE YOUR COVID TEST IS COMPLETED,  PLEASE BEGIN THE QUARANTINE INSTRUCTIONS AS OUTLINED IN YOUR HANDOUT.                Greta Doom   Your procedure is scheduled on: 09/26/20   Report to [facility] Main  Entrance   Report to Short Stay at 5:30 AM     Call this number if you have problems the morning of surgery [phone]    Remember: Do not eat food or drink liquids :After Midnight.   BRUSH YOUR TEETH MORNING OF SURGERY AND RINSE YOUR MOUTH OUT, NO CHEWING GUM CANDY OR MINTS.     Take these medicines the morning of surgery with A SIP OF WATER: Biktarvy, Amlodipine                                 You may not have any metal on your body including              piercings  Do not wear jewelry, lotions, powders or deodorant                          Men may shave face and neck.   Do not bring valuables to the hospital. [place].  Contacts, dentures or bridgework may not be worn into surgery.       Patients discharged the day of surgery will not be allowed to drive home  . IF YOU ARE HAVING SURGERY AND GOING HOME THE SAME DAY, YOU MUST HAVE AN ADULT TO DRIVE YOU HOME AND BE WITH YOU FOR 24 HOURS. YOU MAY GO HOME BY TAXI OR UBER OR ORTHERWISE, BUT AN ADULT MUST ACCOMPANY YOU HOME AND STAY WITH YOU FOR 24 HOURS.  Name and phone number of your driver:  Special Instructions: N/A              Please read over the  following fact sheets you were given: _____________________________________________________________________             [facility] - Preparing for Surgery Before surgery, you can play an important role.  Because skin is not sterile, your skin needs to be as free of germs as possible.  You can reduce the number of germs on your skin by washing with CHG (chlorahexidine gluconate) soap before surgery.  CHG is an antiseptic cleaner which kills germs and bonds with the skin to continue killing germs even after washing. Please DO NOT use if you have an allergy to CHG or antibacterial soaps.  If your skin becomes reddened/irritated stop using the CHG and inform your nurse when you arrive at Short Stay.   You may shave your face/neck.  Please follow these instructions carefully:  1.  Shower with CHG Soap the night before surgery and the  morning of Surgery.  2.  If you choose to wash your hair, wash your hair first as usual with your  normal  shampoo.  3.  After you shampoo, rinse your hair and body thoroughly to remove the  shampoo.                                        4.  Use CHG as you would any other liquid soap.  You can apply chg directly  to the skin and wash                       Gently with a scrungie or clean washcloth.  5.  Apply the CHG Soap to your body ONLY FROM THE NECK DOWN.   Do not use on face/ open                           Wound or open sores. Avoid contact with eyes, ears mouth and genitals (private parts).                       Wash face,  Genitals (private parts) with your normal soap.             6.  Wash thoroughly, paying special attention to the area where your surgery  will be performed.  7.  Thoroughly rinse your body with warm water from the neck down.  8.  DO NOT shower/wash with your normal soap after using and rinsing off  the CHG Soap.             9.  Pat yourself dry with a clean towel.            10.  Wear clean pajamas.            11.  Place clean sheets on  your bed the night of your first shower and do not  sleep with pets. Day of Surgery : Do not apply any lotions/deodorants the morning of surgery.  Please wear clean clothes to the hospital/surgery center.  FAILURE TO FOLLOW THESE INSTRUCTIONS MAY RESULT IN THE CANCELLATION OF YOUR SURGERY PATIENT SIGNATURE_________________________________  NURSE SIGNATURE__________________________________  ________________________________________________________________________

## 2020-09-19 ENCOUNTER — Encounter (HOSPITAL_COMMUNITY)
Admission: RE | Admit: 2020-09-19 | Discharge: 2020-09-19 | Disposition: A | Discharge status: Home / Self Care | Payer: BC Managed Care – PPO | Source: Ambulatory Visit | Attending: General Surgery | Admitting: General Surgery

## 2020-09-19 ENCOUNTER — Other Ambulatory Visit: Payer: Self-pay

## 2020-09-19 ENCOUNTER — Encounter (HOSPITAL_COMMUNITY): Payer: Self-pay

## 2020-09-19 DIAGNOSIS — Z01812 Encounter for preprocedural laboratory examination: Secondary | ICD-10-CM | POA: Diagnosis not present

## 2020-09-19 LAB — BASIC METABOLIC PANEL
Anion gap: 9 (ref 5–15)
BUN: 18 mg/dL (ref 6–20)
CO2: 23 mmol/L (ref 22–32)
Calcium: 9.3 mg/dL (ref 8.9–10.3)
Chloride: 105 mmol/L (ref 98–111)
Creatinine, Ser: 0.95 mg/dL (ref 0.61–1.24)
GFR, Estimated: >60 mL/min (ref >60)
Glucose, Bld: 111 mg/dL — ABNORMAL HIGH (ref 70–99)
Potassium: 3.8 mmol/L (ref 3.5–5.1)
Sodium: 137 mmol/L (ref 135–145)

## 2020-09-19 LAB — CBC
HCT: 39.1 % (ref 39.0–52.0)
Hemoglobin: 14.3 g/dL (ref 13.0–17.0)
MCH: 35.3 pg — ABNORMAL HIGH (ref 26.0–34.0)
MCHC: 36.6 g/dL — ABNORMAL HIGH (ref 30.0–36.0)
MCV: 96.5 fL (ref 80.0–100.0)
Platelets: 171 10*3/uL (ref 150–400)
RBC: 4.05 MIL/uL — ABNORMAL LOW (ref 4.22–5.81)
RDW: 11.9 % (ref 11.5–15.5)
WBC: 4.4 10*3/uL (ref 4.0–10.5)
nRBC: 0 % (ref 0.0–0.2)

## 2020-09-19 NOTE — Progress Notes (Signed)
COVID Vaccine Completed:yes Date COVID Vaccine completed:12/13/19-booster 08/21/20 COVID vaccine manufacturer: [place]      PCP - Dorthula Nettles PA Cardiologist - none  Chest x-ray - no EKG - 03/28/20-epic Stress Test - no  ECHO - no Cardiac Cath - no Pacemaker/ICD device last checked:NA  Sleep Study - no CPAP -   Fasting Blood Sugar - NA Checks Blood Sugar _____ times a day  Blood Thinner Instructions:NA Aspirin Instructions: Last Dose:  Anesthesia review:   Patient denies shortness of breath, fever, cough and chest pain at PAT appointment yes  Patient verbalized understanding of instructions that were given to them at the PAT appointment. Patient was also instructed that they will need to review over the PAT instructions again at home before surgery.yes Pt is in good shape and has no SOB with any activities. He takes antiviral medication for HIV.

## 2020-09-23 ENCOUNTER — Other Ambulatory Visit (HOSPITAL_COMMUNITY)
Admission: RE | Admit: 2020-09-23 | Discharge: 2020-09-23 | Disposition: A | Discharge status: Home / Self Care | Payer: BC Managed Care – PPO | Source: Ambulatory Visit | Attending: General Surgery | Admitting: General Surgery

## 2020-09-23 DIAGNOSIS — Z20822 Contact with and (suspected) exposure to covid-19: Secondary | ICD-10-CM | POA: Insufficient documentation

## 2020-09-23 DIAGNOSIS — Z01812 Encounter for preprocedural laboratory examination: Secondary | ICD-10-CM | POA: Insufficient documentation

## 2020-09-23 LAB — SARS CORONAVIRUS 2 (TAT 6-24 HRS): SARS Coronavirus 2: NEGATIVE

## 2020-09-25 ENCOUNTER — Encounter (HOSPITAL_COMMUNITY): Payer: Self-pay | Admitting: General Surgery

## 2020-09-25 MED ORDER — BUPIVACAINE LIPOSOME 1.3 % IJ SUSP
20.0000 mL | INTRAMUSCULAR | Status: DC
  Filled 2020-09-25 (×2): qty 20

## 2020-09-25 NOTE — Anesthesia Preprocedure Evaluation (Addendum)
Anesthesia Evaluation  Patient identified by MRN, date of birth, ID band Patient awake    Reviewed: Allergy & Precautions, NPO status , Patient's Chart, lab work & pertinent test results  Airway Mallampati: II  TM Distance: >3 FB Neck ROM: Full    Dental no notable dental hx.    Pulmonary former smoker,    Pulmonary exam normal breath sounds clear to auscultation       Cardiovascular hypertension, Pt. on medications Normal cardiovascular exam Rhythm:Regular Rate:Normal     Neuro/Psych Chemotherapy induced peripheral neuropathy negative psych ROS   GI/Hepatic Neg liver ROS, Hx/o rectal Ca S/P sigmoid colectomy APR, colostomy Colostomy reversal   Endo/Other  Hyperlipidemia  Renal/GU Renal disease  negative genitourinary   Musculoskeletal Incisional hernia   Abdominal   Peds  Hematology  (+) HIV, Well controlled on therapy   Anesthesia Other Findings   Reproductive/Obstetrics                            Anesthesia Physical Anesthesia Plan  ASA: III  Anesthesia Plan: General   Post-op Pain Management:    Induction: Intravenous  PONV Risk Score and Plan: 4 or greater and Treatment may vary due to age or medical condition, Ondansetron, Dexamethasone and Amisulpride  Airway Management Planned: Oral ETT  Additional Equipment:   Intra-op Plan:   Post-operative Plan: Extubation in OR  Informed Consent: I have reviewed the patients History and Physical, chart, labs and discussed the procedure including the risks, benefits and alternatives for the proposed anesthesia with the patient or authorized representative who has indicated his/her understanding and acceptance.     Dental advisory given  Plan Discussed with: CRNA and Anesthesiologist  Anesthesia Plan Comments:        Anesthesia Quick Evaluation

## 2020-09-26 ENCOUNTER — Encounter (HOSPITAL_COMMUNITY)
Admission: RE | Disposition: A | Discharge status: Home / Self Care | Payer: Self-pay | Source: Ambulatory Visit | Attending: General Surgery

## 2020-09-26 ENCOUNTER — Ambulatory Visit (HOSPITAL_COMMUNITY): Payer: BC Managed Care – PPO | Admitting: Anesthesiology

## 2020-09-26 ENCOUNTER — Encounter (HOSPITAL_COMMUNITY): Payer: Self-pay | Admitting: General Surgery

## 2020-09-26 ENCOUNTER — Ambulatory Visit (HOSPITAL_COMMUNITY)
Admission: RE | Admit: 2020-09-26 | Discharge: 2020-09-26 | Disposition: A | Discharge status: Home / Self Care | Payer: BC Managed Care – PPO | Source: Ambulatory Visit | Attending: General Surgery | Admitting: General Surgery

## 2020-09-26 DIAGNOSIS — Z803 Family history of malignant neoplasm of breast: Secondary | ICD-10-CM | POA: Insufficient documentation

## 2020-09-26 DIAGNOSIS — Z9049 Acquired absence of other specified parts of digestive tract: Secondary | ICD-10-CM | POA: Insufficient documentation

## 2020-09-26 DIAGNOSIS — Z21 Asymptomatic human immunodeficiency virus [HIV] infection status: Secondary | ICD-10-CM | POA: Diagnosis not present

## 2020-09-26 DIAGNOSIS — Z85048 Personal history of other malignant neoplasm of rectum, rectosigmoid junction, and anus: Secondary | ICD-10-CM | POA: Diagnosis not present

## 2020-09-26 DIAGNOSIS — Z79899 Other long term (current) drug therapy: Secondary | ICD-10-CM | POA: Insufficient documentation

## 2020-09-26 DIAGNOSIS — K432 Incisional hernia without obstruction or gangrene: Secondary | ICD-10-CM | POA: Diagnosis not present

## 2020-09-26 DIAGNOSIS — Z87891 Personal history of nicotine dependence: Secondary | ICD-10-CM | POA: Diagnosis not present

## 2020-09-26 DIAGNOSIS — Z923 Personal history of irradiation: Secondary | ICD-10-CM | POA: Insufficient documentation

## 2020-09-26 DIAGNOSIS — I10 Essential (primary) hypertension: Secondary | ICD-10-CM | POA: Diagnosis not present

## 2020-09-26 DIAGNOSIS — G629 Polyneuropathy, unspecified: Secondary | ICD-10-CM | POA: Diagnosis not present

## 2020-09-26 DIAGNOSIS — E785 Hyperlipidemia, unspecified: Secondary | ICD-10-CM | POA: Diagnosis not present

## 2020-09-26 HISTORY — PX: INCISIONAL HERNIA REPAIR: SHX193

## 2020-09-26 SURGERY — REPAIR, HERNIA, INCISIONAL, LAPAROSCOPIC
Anesthesia: General

## 2020-09-26 MED ORDER — SUCCINYLCHOLINE CHLORIDE 200 MG/10ML IV SOSY
PREFILLED_SYRINGE | INTRAVENOUS | Status: AC
  Filled 2020-09-26: qty 10

## 2020-09-26 MED ORDER — OXYCODONE HCL 5 MG PO TABS
ORAL_TABLET | ORAL | Status: AC
  Administered 2020-09-26: 5 mg via ORAL
  Filled 2020-09-26: qty 1

## 2020-09-26 MED ORDER — ONDANSETRON HCL 4 MG/2ML IJ SOLN
INTRAMUSCULAR | Status: DC | PRN
  Administered 2020-09-26: 4 mg via INTRAVENOUS

## 2020-09-26 MED ORDER — DEXAMETHASONE SODIUM PHOSPHATE 10 MG/ML IJ SOLN
INTRAMUSCULAR | Status: DC | PRN
  Administered 2020-09-26: 10 mg via INTRAVENOUS

## 2020-09-26 MED ORDER — HYDROMORPHONE HCL 1 MG/ML IJ SOLN
0.2500 mg | INTRAMUSCULAR | Status: DC | PRN
  Administered 2020-09-26 (×3): 0.25 mg via INTRAVENOUS

## 2020-09-26 MED ORDER — ORAL CARE MOUTH RINSE: 15.0000 mL | Freq: Once | OROMUCOSAL | Status: AC

## 2020-09-26 MED ORDER — LIDOCAINE 2% (20 MG/ML) 5 ML SYRINGE
INTRAMUSCULAR | Status: DC | PRN
  Administered 2020-09-26: 100 mg via INTRAVENOUS

## 2020-09-26 MED ORDER — BUPIVACAINE-EPINEPHRINE 0.25% -1:200000 IJ SOLN
INTRAMUSCULAR | Status: DC | PRN
  Administered 2020-09-26: 30 mL

## 2020-09-26 MED ORDER — LACTATED RINGERS IR SOLN
Status: DC | PRN
  Administered 2020-09-26: 3000 mL

## 2020-09-26 MED ORDER — CHLORHEXIDINE GLUCONATE 0.12 % MT SOLN
15.0000 mL | Freq: Once | OROMUCOSAL | Status: AC
  Administered 2020-09-26: 15 mL via OROMUCOSAL

## 2020-09-26 MED ORDER — ROCURONIUM BROMIDE 10 MG/ML (PF) SYRINGE
PREFILLED_SYRINGE | INTRAVENOUS | Status: AC
  Filled 2020-09-26: qty 10

## 2020-09-26 MED ORDER — SUGAMMADEX SODIUM 200 MG/2ML IV SOLN
INTRAVENOUS | Status: DC | PRN
  Administered 2020-09-26: 200 mg via INTRAVENOUS

## 2020-09-26 MED ORDER — 0.9 % SODIUM CHLORIDE (POUR BTL) OPTIME
TOPICAL | Status: DC | PRN
  Administered 2020-09-26: 1000 mL

## 2020-09-26 MED ORDER — ONDANSETRON HCL 4 MG/2ML IJ SOLN: 4.0000 mg | Freq: Once | INTRAMUSCULAR | Status: DC | PRN

## 2020-09-26 MED ORDER — LACTATED RINGERS IV SOLN: INTRAVENOUS | Status: DC

## 2020-09-26 MED ORDER — PROPOFOL 10 MG/ML IV BOLUS
INTRAVENOUS | Status: AC
  Filled 2020-09-26: qty 40

## 2020-09-26 MED ORDER — OXYCODONE HCL 5 MG PO TABS: 5.0000 mg | ORAL_TABLET | Freq: Four times a day (QID) | ORAL | 0 refills | Status: DC | PRN

## 2020-09-26 MED ORDER — CEFAZOLIN SODIUM-DEXTROSE 2-4 GM/100ML-% IV SOLN
2.0000 g | INTRAVENOUS | Status: AC
  Administered 2020-09-26: 2 g via INTRAVENOUS
  Filled 2020-09-26: qty 100

## 2020-09-26 MED ORDER — ACETAMINOPHEN 500 MG PO TABS
1000.0000 mg | ORAL_TABLET | ORAL | Status: AC
  Administered 2020-09-26: 1000 mg via ORAL
  Filled 2020-09-26: qty 2

## 2020-09-26 MED ORDER — MIDAZOLAM HCL 2 MG/2ML IJ SOLN
INTRAMUSCULAR | Status: DC | PRN
  Administered 2020-09-26: 2 mg via INTRAVENOUS

## 2020-09-26 MED ORDER — SODIUM CHLORIDE 0.9% FLUSH: 3.0000 mL | Freq: Two times a day (BID) | INTRAVENOUS | Status: DC

## 2020-09-26 MED ORDER — DEXAMETHASONE SODIUM PHOSPHATE 10 MG/ML IJ SOLN
INTRAMUSCULAR | Status: AC
  Filled 2020-09-26: qty 1

## 2020-09-26 MED ORDER — BUPIVACAINE-EPINEPHRINE (PF) 0.25% -1:200000 IJ SOLN
INTRAMUSCULAR | Status: AC
  Filled 2020-09-26: qty 30

## 2020-09-26 MED ORDER — ONDANSETRON HCL 4 MG/2ML IJ SOLN
INTRAMUSCULAR | Status: AC
  Filled 2020-09-26: qty 2

## 2020-09-26 MED ORDER — HYDROMORPHONE HCL 1 MG/ML IJ SOLN
INTRAMUSCULAR | Status: AC
  Administered 2020-09-26: 0.25 mg via INTRAVENOUS
  Filled 2020-09-26: qty 1

## 2020-09-26 MED ORDER — GABAPENTIN 300 MG PO CAPS
300.0000 mg | ORAL_CAPSULE | ORAL | Status: AC
  Administered 2020-09-26: 300 mg via ORAL
  Filled 2020-09-26: qty 1

## 2020-09-26 MED ORDER — ROCURONIUM BROMIDE 10 MG/ML (PF) SYRINGE
PREFILLED_SYRINGE | INTRAVENOUS | Status: DC | PRN
  Administered 2020-09-26: 20 mg via INTRAVENOUS
  Administered 2020-09-26: 60 mg via INTRAVENOUS

## 2020-09-26 MED ORDER — CELECOXIB 200 MG PO CAPS
200.0000 mg | ORAL_CAPSULE | ORAL | Status: AC
  Administered 2020-09-26: 200 mg via ORAL
  Filled 2020-09-26: qty 1

## 2020-09-26 MED ORDER — FENTANYL CITRATE (PF) 250 MCG/5ML IJ SOLN
INTRAMUSCULAR | Status: AC
  Filled 2020-09-26: qty 5

## 2020-09-26 MED ORDER — MIDAZOLAM HCL 2 MG/2ML IJ SOLN
INTRAMUSCULAR | Status: AC
  Filled 2020-09-26: qty 2

## 2020-09-26 MED ORDER — LIDOCAINE 2% (20 MG/ML) 5 ML SYRINGE
INTRAMUSCULAR | Status: AC
  Filled 2020-09-26: qty 5

## 2020-09-26 MED ORDER — FENTANYL CITRATE (PF) 250 MCG/5ML IJ SOLN
INTRAMUSCULAR | Status: DC | PRN
  Administered 2020-09-26 (×5): 50 ug via INTRAVENOUS

## 2020-09-26 MED ORDER — BUPIVACAINE LIPOSOME 1.3 % IJ SUSP
INTRAMUSCULAR | Status: DC | PRN
  Administered 2020-09-26: 20 mL

## 2020-09-26 MED ORDER — PROPOFOL 10 MG/ML IV BOLUS
INTRAVENOUS | Status: DC | PRN
  Administered 2020-09-26: 200 mg via INTRAVENOUS

## 2020-09-26 MED ORDER — OXYCODONE HCL 5 MG/5ML PO SOLN: 5.0000 mg | Freq: Once | ORAL | Status: AC | PRN

## 2020-09-26 MED ORDER — OXYCODONE HCL 5 MG PO TABS: 5.0000 mg | ORAL_TABLET | Freq: Once | ORAL | Status: AC | PRN

## 2020-09-26 SURGICAL SUPPLY — 43 items
ADH SKN CLS APL DERMABOND .7 (GAUZE/BANDAGES/DRESSINGS) ×1
APL PRP STRL LF DISP 70% ISPRP (MISCELLANEOUS) ×1
BINDER ABDOMINAL 12 ML 46-62 (SOFTGOODS) IMPLANT
BINDER UNISEX CHEST M 46X13 (GAUZE/BANDAGES/DRESSINGS) ×2 IMPLANT
CHLORAPREP W/TINT 26 (MISCELLANEOUS) ×2 IMPLANT
COVER WAND RF STERILE (DRAPES) IMPLANT
DECANTER SPIKE VIAL GLASS SM (MISCELLANEOUS) ×2 IMPLANT
DERMABOND ADVANCED (GAUZE/BANDAGES/DRESSINGS) ×1
DERMABOND ADVANCED .7 DNX12 (GAUZE/BANDAGES/DRESSINGS) ×1 IMPLANT
DEVICE SECURE STRAP 25 ABSORB (INSTRUMENTS) ×1 IMPLANT
DEVICE TROCAR PUNCTURE CLOSURE (ENDOMECHANICALS) ×2 IMPLANT
ELECT PENCIL ROCKER SW 15FT (MISCELLANEOUS) ×1 IMPLANT
ELECT REM PT RETURN 15FT ADLT (MISCELLANEOUS) ×2 IMPLANT
GLOVE SURG ENC MOIS LTX SZ6.5 (GLOVE) ×2 IMPLANT
GLOVE SURG UNDER POLY LF SZ7 (GLOVE) ×2 IMPLANT
GOWN STRL REUS W/TWL XL LVL3 (GOWN DISPOSABLE) ×6 IMPLANT
GRASPER SUT TROCAR 14GX15 (MISCELLANEOUS) ×1 IMPLANT
IRRIG SUCT STRYKERFLOW 2 WTIP (MISCELLANEOUS) ×2
IRRIGATION SUCT STRKRFLW 2 WTP (MISCELLANEOUS) ×1 IMPLANT
KIT BASIN OR (CUSTOM PROCEDURE TRAY) ×2 IMPLANT
KIT TURNOVER KIT A (KITS) ×2 IMPLANT
MARKER SKIN DUAL TIP RULER LAB (MISCELLANEOUS) ×2 IMPLANT
MESH VENTRALIGHT ST 6IN CRC (Mesh General) ×1 IMPLANT
NDL SPNL 22GX3.5 QUINCKE BK (NEEDLE) ×1 IMPLANT
NEEDLE INSUFFLATION 14GA 120MM (NEEDLE) ×2 IMPLANT
NEEDLE SPNL 22GX3.5 QUINCKE BK (NEEDLE) ×2 IMPLANT
PAD POSITIONING PINK XL (MISCELLANEOUS) ×2 IMPLANT
PROTECTOR NERVE ULNAR (MISCELLANEOUS) ×2 IMPLANT
SCISSORS LAP 5X35 DISP (ENDOMECHANICALS) ×2 IMPLANT
SET TUBE SMOKE EVAC HIGH FLOW (TUBING) ×2 IMPLANT
SLEEVE XCEL OPT CAN 5 100 (ENDOMECHANICALS) ×4 IMPLANT
SUT NOVA NAB DX-16 0-1 5-0 T12 (SUTURE) ×8 IMPLANT
SUT VIC AB 4-0 PS2 18 (SUTURE) ×2 IMPLANT
SUT VIC AB 4-0 PS2 27 (SUTURE) ×2 IMPLANT
TOWEL OR 17X26 10 PK STRL BLUE (TOWEL DISPOSABLE) ×2 IMPLANT
TOWEL OR NON WOVEN STRL DISP B (DISPOSABLE) ×2 IMPLANT
TRAY FOLEY MTR SLVR 16FR STAT (SET/KITS/TRAYS/PACK) IMPLANT
TRAY LAPAROSCOPIC (CUSTOM PROCEDURE TRAY) ×2 IMPLANT
TROCAR BLADELESS OPT 5 100 (ENDOMECHANICALS) ×2 IMPLANT
TROCAR XCEL 12X100 BLDLESS (ENDOMECHANICALS) ×2 IMPLANT
TROCAR XCEL BLUNT TIP 100MML (ENDOMECHANICALS) IMPLANT
TROCAR XCEL NON-BLD 11X100MML (ENDOMECHANICALS) IMPLANT
YANKAUER SUCT BULB TIP 10FT TU (MISCELLANEOUS) ×1 IMPLANT

## 2020-09-26 NOTE — Op Note (Signed)
09/26/2020  9:03 AM  PATIENT:  Joshua Zhang  50 y.o. male  Patient Care Team: Merwyn Katos as PCP - General (Physician Assistant) Michel Bickers, MD as Consulting Physician (Infectious Diseases) Leighton Ruff, MD as Consulting Physician (General Surgery) Truitt Merle, MD as Consulting Physician (Hematology) Kyung Rudd, MD as Consulting Physician (Radiation Oncology) Jonnie Finner, RN as Oncology Nurse Navigator  PRE-OPERATIVE DIAGNOSIS:  INCISIONAL HERNIA  POST-OPERATIVE DIAGNOSIS:  INCISIONAL HERNIA  PROCEDURE:   LAPAROSCOPIC INCISIONAL HERNIA REPAIR WITH MESH    Surgeon(s): Leighton Ruff, MD  ASSISTANT: none   ANESTHESIA:   local and general  EBL:  Total I/O In: 100 [IV Piggyback:100] Out: 64 [Blood:30]  Delay start of Pharmacological VTE agent (>24hrs) due to surgical blood loss or risk of bleeding:  no  DRAINS: none   SPECIMEN:  No Specimen  DISPOSITION OF SPECIMEN:  N/A  COUNTS:  YES  PLAN OF CARE: Discharge to home after PACU  PATIENT DISPOSITION:  PACU - hemodynamically stable.  INDICATION: Pleasant patient has developed a ventral wall abdominal hernia.   Recommendation was made for surgical repair:  The anatomy & physiology of the abdominal wall was discussed. The pathophysiology of hernias was discussed. Natural history risks without surgery including progeressive enlargement, pain, incarceration & strangulation was discussed. Contributors to complications such as smoking, obesity, diabetes, prior surgery, etc were discussed.  I feel the risks of no intervention will lead to serious problems that outweigh the operative risks; therefore, I recommended surgery to reduce and repair the hernia. I explained laparoscopic techniques with possible need for an open approach. I noted the probable use of mesh to patch and/or buttress the hernia repair  Risks such as bleeding, infection, abscess, need for further treatment, heart attack, death,  and other risks were discussed. I noted a good likelihood this will help address the problem. Goals of post-operative recovery were discussed as well. Possibility that this will not correct all symptoms was explained. I stressed the importance of low-impact activity, aggressive pain control, avoiding constipation, & not pushing through pain to minimize risk of post-operative chronic pain or injury. Possibility of reherniation especially with smoking, obesity, diabetes, immunosuppression, and other health conditions was discussed. We will work to minimize complications.  An educational handout further explaining the pathology & treatment options was given as well. Questions were answered. The patient expresses understanding & wishes to proceed with surgery.   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 The patient underwent general anaesthesia without difficulty. The patient was positioned appropriately. VTE prevention in place. The patient's abdomen was clipped, prepped, & draped in a sterile fashion. Surgical timeout confirmed our plan.  I made an incision over the right lower quadrant scar using a 15 blade scalpel.  I removed this.  I continue my dissection into the hernia.  The hernia sac was incised and the omentum was cleared from the edges of the hernia sac using electrocautery.  Hemostasis was achieved as we went.  Once all the omentum was back inside the abdomen, the fascial edges were cleared using electrocautery.  I then measured the defect to be 7 x 6 cm.  I selected a 15 cm round mesh.  4, #1 Novafil sutures were placed into the mesh and the mesh was then placed into the abdomen in  the appropriate position.  I then placed a 5 mm port at midline under direct visualization.  I then closed the fascial defect using interrupted #1 Novafil sutures.  I induced carbon dioxide insufflation through the 5 mm port. Camera inspection revealed no injury. Extra ports were carefully placed under direct laparoscopic visualization.    I secured the mesh to cover up the hernia defect using a laparoscopic suture passer to pass the tails of the Novafil through the abdominal wall & tagged them with clamps.  I started out in four corners to make sure I had the mesh centered over the hernia defect appropriately, and then proceeded to work in quadrants.  We evacuated CO2 & desufflated the abdomen.  I tied the fascial stitches down.  I reinsufflated the abdomen.  The mesh provided at least 4 cm circumferential coverage around the entire region of hernia defects.   I tacked the edges & central part of the mesh to the peritoneum/posterior rectus fascia with SecureStrap absorbable tacks.   Hemostasis was excellent.  I placed a tap block bilaterally using Marcaine mixed with Exparel under laparoscopic visualization. I did reinspection. Hemostasis was good. Mesh laid well. Capnoperitoneum was evacuated. Ports were removed. The skin was closed with Monocryl at the port sites.  The right lower quadrant wound was closed in layers using a 0 Vicryl for the bottom layer, 2-0 Vicryl pop offs to close the subcutaneous tissue and posterior dermal layer.  I then used a 4-0 Monocryl suture to close the skin in a running subcuticular fashion.  Dermabond was placed over the incision site and the port sites.  An abdominal binder was then placed on the patient and he was awakened from anesthesia and sent to the postanesthesia care unit in stable condition.  All counts were correct per operating room staff. Patient is being extubated to go to the recovery room. I'm about discussed operative findings with the patient's family.

## 2020-09-26 NOTE — Anesthesia Postprocedure Evaluation (Signed)
Anesthesia Post Note  Patient: Joshua Zhang  Procedure(s) Performed: LAPAROSCOPIC INCISIONAL HERNIA REPAIR WITH MESH (N/A )     Patient location during evaluation: PACU Anesthesia Type: General Level of consciousness: awake and alert and oriented Pain management: pain level controlled Vital Signs Assessment: post-procedure vital signs reviewed and stable Respiratory status: spontaneous breathing, nonlabored ventilation and respiratory function stable Cardiovascular status: blood pressure returned to baseline and stable Postop Assessment: no apparent nausea or vomiting Anesthetic complications: no   No complications documented.  Last Vitals:  Vitals:   09/26/20 0950 09/26/20 0952  BP:    Pulse: 86 75  Resp: 14 14  Temp:  36.9 C  SpO2: 97% 97%    Last Pain:  Vitals:   09/26/20 0950  TempSrc:   PainSc: 5                  Allene Furuya A.

## 2020-09-26 NOTE — Discharge Instructions (Addendum)
HERNIA REPAIR: POST OP INSTRUCTIONS  ######################################################################  EAT Gradually transition to a high fiber diet with a fiber supplement over the next few weeks after discharge.  Start with a pureed / full liquid diet (see below)  WALK Walk an hour a day.  Control your pain to do that.    CONTROL PAIN Control pain so that you can walk, sleep, tolerate sneezing/coughing, and go up/down stairs.  HAVE A BOWEL MOVEMENT DAILY Keep your bowels regular to avoid problems.  OK to try a laxative to override constipation.  OK to use an antidairrheal to slow down diarrhea.  Call if not better after 2 tries  CALL IF YOU HAVE PROBLEMS/CONCERNS Call if you are still struggling despite following these instructions. Call if you have concerns not answered by these instructions  ######################################################################    1. DIET: Follow a light bland diet the first 24 hours after arrival home, such as soup, liquids, crackers, etc.  Be sure to include lots of fluids daily.  Advance to a low fat / high fiber diet over the next few days after surgery.  Avoid fast food or heavy meals the first week as your are more likely to get nauseated.    2. Take your usually prescribed home medications unless otherwise directed.  3. PAIN CONTROL: a. Pain is best controlled by a usual combination of three different methods TOGETHER: i. Ice/Heat ii. Over the counter pain medication iii. Prescription pain medication b. Most patients will experience some swelling and bruising around the hernia(s) such as the bellybutton, groins, or old incisions.  Ice packs or heating pads (30-60 minutes up to 6 times a day) will help. Use ice for the first few days to help decrease swelling and bruising, then switch to heat to help relax tight/sore spots and speed recovery.  Some people prefer to use ice alone, heat alone, alternating between ice & heat.  Experiment  to what works for you.  Swelling and bruising can take several weeks to resolve.   c. It is helpful to take an over-the-counter pain medication regularly for the first few weeks.  Choose one of the following that works best for you: i. Naproxen (Aleve, etc)  Two 220mg  tabs twice a day ii. Ibuprofen (Advil, etc) Three 200mg  tabs four times a day (every meal & bedtime) iii. Acetaminophen (Tylenol, etc) 325-650mg  four times a day (every meal & bedtime) d. A  prescription for pain medication should be given to you upon discharge.  Take your pain medication as prescribed.  i. If you are having problems/concerns with the prescription medicine (does not control pain, nausea, vomiting, rash, itching, etc), please call us [phone] to see if we need to switch you to a different pain medicine that will work better for you and/or control your side effect better. ii. If you need a refill on your pain medication, please contact your pharmacy.  They will contact our office to request authorization. Prescriptions will not be filled after 5 pm or on week-ends.  4. Avoid getting constipated.  Between the surgery and the pain medications, it is common to experience some constipation.  Increasing fluid intake and taking a fiber supplement (such as Metamucil, Citrucel, FiberCon, MiraLax, etc) 1-2 times a day regularly will usually help prevent this problem from occurring.  A mild laxative (prune juice, Milk of Magnesia, MiraLax, etc) should be taken according to package directions if there are no bowel movements after 48 hours.    5. Wash / shower every  day.  You may shower over the dressings as they are waterproof.    6. Use your abdominal binder when up walking around.  You may cover the incision for comfort if you wish. You may leave the incisions open to air.  Ok to shower over incision(s).  7. ACTIVITIES as tolerated:   a. You may resume regular (light) daily activities beginning the next day--such as daily  self-care, walking, climbing stairs--gradually increasing activities as tolerated.  Control your pain so that you can walk an hour a day.  If you can walk 30 minutes without difficulty, it is safe to try more intense activity such as jogging, treadmill, bicycling, low-impact aerobics, swimming, etc. b. Save the most intensive and strenuous activity for last such as sit-ups, heavy lifting, contact sports, etc  Refrain from any heavy lifting or straining until you are off narcotics for pain control.   c. DO NOT PUSH THROUGH PAIN.  Let pain be your guide: If it hurts to do something, don't do it.  Pain is your body warning you to avoid that activity for another week until the pain goes down. d. You may drive when you are no longer taking prescription pain medication, you can comfortably wear a seatbelt, and you can safely maneuver your car and apply brakes. e. Dennis Bast may have sexual intercourse when it is comfortable.   8. FOLLOW UP in our office a. Please call [facility] at [phone] to set up an appointment to see your surgeon in the office for a follow-up appointment approximately 2-3 weeks after your surgery. b. Make sure that you call for this appointment the day you arrive home to insure a convenient appointment time.  9.  If you have disability of FMLA / Family leave forms, please bring the forms to the office for processing.  (do not give to your surgeon).  WHEN TO CALL us [phone]: 1. Poor pain control 2. Reactions / problems with new medications (rash/itching, nausea, etc)  3. Fever over 101.5 F (38.5 C) 4. Inability to urinate 5. Nausea and/or vomiting 6. Worsening swelling or bruising 7. Continued bleeding from incision. 8. Increased pain, redness, or drainage from the incision   The clinic staff is available to answer your questions during regular business hours (8:30am-5pm).  Please don't hesitate to call and ask to speak to one of our nurses for clinical concerns.   If you have a  medical emergency, go to the nearest emergency room or call 911.  A surgeon from [facility] is always on call at the hospitals in [facility], [place], [place], [place], [place]  [address] ?  [address] [address], [place], [place]   [address] MAIN: [phone] ? TOLL FREE: [phone] ? FAX: [phone] [email or web address]

## 2020-09-26 NOTE — Transfer of Care (Signed)
Immediate Anesthesia Transfer of Care Note  Patient: Joshua Zhang  Procedure(s) Performed: LAPAROSCOPIC INCISIONAL HERNIA REPAIR WITH MESH (N/A )  Patient Location: PACU  Anesthesia Type:General  Level of Consciousness: awake, patient cooperative and responds to stimulation  Airway & Oxygen Therapy: Patient Spontanous Breathing and Patient connected to face mask oxygen  Post-op Assessment: Report given to RN, Post -op Vital signs reviewed and stable and Patient moving all extremities X 4  Post vital signs: Reviewed and stable  Last Vitals:  Vitals Value Taken Time  BP 152/96 09/26/20 0915  Temp    Pulse 90 09/26/20 0915  Resp 17 09/26/20 0915  SpO2 100 % 09/26/20 0915  Vitals shown include unvalidated device data.  Last Pain:  Vitals:   09/26/20 0600  TempSrc: Oral  PainSc: 0-No pain         Complications: No complications documented.

## 2020-09-26 NOTE — Anesthesia Procedure Notes (Signed)
Procedure Name: Intubation Date/Time: 09/26/2020 7:29 AM Performed by: Michele Rockers, CRNA Pre-anesthesia Checklist: Patient identified, Patient being monitored, Timeout performed, Emergency Drugs available and Suction available Patient Re-evaluated:Patient Re-evaluated prior to induction Oxygen Delivery Method: Circle System Utilized Preoxygenation: Pre-oxygenation with 100% oxygen Induction Type: IV induction Ventilation: Mask ventilation without difficulty Laryngoscope Size: Miller and 2 Grade View: Grade I Tube type: Oral Tube size: 7.5 mm Number of attempts: 1 Airway Equipment and Method: Stylet Placement Confirmation: ETT inserted through vocal cords under direct vision,  positive ETCO2 and breath sounds checked- equal and bilateral Secured at: 22 cm Tube secured with: Tape Dental Injury: Teeth and Oropharynx as per pre-operative assessment

## 2020-09-26 NOTE — H&P (Signed)
The patient is a 50 year old male who presents with colorectal cancer. 50 year old male who presented to my office with a new diagnosis of rectal cancer in late 2020. This was seen on screening colonoscopy. Patient has noticed rectal bleeding for the past 2 years. He denied any weight loss or changes in bowel habits. Colonoscopy revealed multiple polyps throughout the colon and a lesion in the distal rectum. Biopsies confirm adenocarcinoma within the rectal lesion. CT scans showed no signs of metastatic disease. MRI showed a distal rectal lesion which appeared to be abutting the internal sphincter complex. CEA was 2.5. He has underwent total neoadjuvant chemoradiation treatment. He completed his final dose of radiation on July 15. He then underwent an uncomplicated robotic-assisted low anterior resection with coloanal anastomosis and diverting ileostomy on April 03, 2020. He then and underwent ileostomy reversal in late November 2021. He is here today for follow-up. He is tolerating a diet and having good bowel function. He is having approximately 3-6 bowel movements a day. He is taking fiber pills on a daily basis in the morning and at night. He is getting back to work activities.   Problem List/Past Medical Leighton Ruff, MD; 5/78/4696 4:50 PM) RECTAL CANCER (C20) INCISIONAL HERNIA, WITHOUT OBSTRUCTION OR GANGRENE (K43.2)  Past Surgical History Leighton Ruff, MD; 2/95/2841 4:50 PM) Colon Polyp Removal - Colonoscopy Shoulder Surgery Left.  Diagnostic Studies History Leighton Ruff, MD; 10/10/4008 4:50 PM) Colonoscopy within last year  Allergies Leighton Ruff, MD; 2/72/5366 4:50 PM) No Known Drug Allergies [08/15/2019]:  Medication History Andreas Blower, [place]; 07/29/2020 4:24 PM) amLODIPine Besy-Benazepril HCl (10-20MG  Capsule, Oral) Active. Atorvastatin Calcium (10MG  Tablet, Oral) Active. Biktarvy (50-200-25MG  Tablet, Oral) Active. Multiple Vitamin (1  (one) Oral) Active. Medications Reconciled  Social History Leighton Ruff, MD; 4/40/3474 4:50 PM) Alcohol use Moderate alcohol use. Caffeine use Coffee. No drug use Tobacco use Former smoker.  Family History Leighton Ruff, MD; 2/59/5638 4:50 PM) Breast Cancer Mother. Heart Disease Father.  Other Problems Leighton Ruff, MD; 7/56/4332 4:50 PM) High blood pressure HIV-positive Hypercholesterolemia     Review of Systems Leighton Ruff MD; 9/51/8841 4:50 PM) General Present- Fatigue and Weight Gain. Not Present- Appetite Loss, Chills, Fever, Night Sweats and Weight Loss. Skin Present- Dryness. Not Present- Change in Wart/Mole, Hives, Jaundice, New Lesions, Non-Healing Wounds, Rash and Ulcer. HEENT Present- Seasonal Allergies. Not Present- Earache, Hearing Loss, Hoarseness, Nose Bleed, Oral Ulcers, Ringing in the Ears, Sinus Pain, Sore Throat, Visual Disturbances, Wears glasses/contact lenses and Yellow Eyes. Respiratory Not Present- Bloody sputum, Chronic Cough, Difficulty Breathing, Snoring and Wheezing. Breast Not Present- Breast Mass, Breast Pain, Nipple Discharge and Skin Changes. Cardiovascular Not Present- Chest Pain, Difficulty Breathing Lying Down, Leg Cramps, Palpitations, Rapid Heart Rate, Shortness of Breath and Swelling of Extremities. Gastrointestinal Present- Bloody Stool, Change in Bowel Habits and Chronic diarrhea. Not Present- Abdominal Pain, Bloating, Constipation, Difficulty Swallowing, Excessive gas, Gets full quickly at meals, Hemorrhoids, Indigestion, Nausea, Rectal Pain and Vomiting. Male Genitourinary Not Present- Blood in Urine, Change in Urinary Stream, Frequency, Impotence, Nocturia, Painful Urination, Urgency and Urine Leakage. Musculoskeletal Present- Back Pain. Not Present- Joint Pain, Joint Stiffness, Muscle Pain, Muscle Weakness and Swelling of Extremities. Neurological Not Present- Decreased Memory, Fainting, Headaches, Numbness, Seizures,  Tingling, Tremor, Trouble walking and Weakness. Psychiatric Not Present- Anxiety, Bipolar, Change in Sleep Pattern, Depression, Fearful and Frequent crying. Endocrine Not Present- Cold Intolerance, Excessive Hunger, Hair Changes, Heat Intolerance and New Diabetes. Hematology Present- HIV. Not Present- Blood Thinners, Easy Bruising, Excessive bleeding,  Gland problems and Persistent Infections. BP (!) 147/95   Pulse 72   Temp 98.1 F (36.7 C) (Oral)   Resp 18   Ht 5\' 11"  (1.803 m)   Wt 95.7 kg   SpO2 97%   BMI 29.43 kg/m     Physical Exam   General Mental Status-Alert. General Appearance-Cooperative.  Abdomen Palpation/Percussion Palpation and Percussion of the abdomen reveal - Note: Approximately 4 cm incisional hernia noted at ileostomy site, completely reducible.    Assessment & Plan   .   INCISIONAL HERNIA, WITHOUT OBSTRUCTION OR GANGRENE (K43.2) Impression: Patient appears to be developing an incisional hernia at his ileostomy site. I have recommended laparoscopic hernia repair. We have discussed this in detail including risk of infection, postoperative pain and recurrence. All questions were answered. Patient would like to go ahead and get this done in the near future.

## 2020-09-27 ENCOUNTER — Encounter (HOSPITAL_COMMUNITY): Payer: Self-pay | Admitting: General Surgery

## 2020-10-18 ENCOUNTER — Other Ambulatory Visit (HOSPITAL_COMMUNITY): Payer: Self-pay

## 2020-10-24 ENCOUNTER — Other Ambulatory Visit (HOSPITAL_COMMUNITY): Payer: Self-pay

## 2020-10-24 MED FILL — Bictegravir-Emtricitabine-Tenofovir AF Tab 50-200-25 MG: ORAL | 30 days supply | Qty: 30 | Fill #0 | Status: AC

## 2020-10-28 ENCOUNTER — Other Ambulatory Visit (HOSPITAL_COMMUNITY): Payer: Self-pay

## 2020-11-21 ENCOUNTER — Other Ambulatory Visit (HOSPITAL_COMMUNITY): Payer: Self-pay

## 2020-11-21 MED FILL — Bictegravir-Emtricitabine-Tenofovir AF Tab 50-200-25 MG: ORAL | 30 days supply | Qty: 30 | Fill #1 | Status: AC

## 2020-11-25 ENCOUNTER — Other Ambulatory Visit: Payer: Self-pay

## 2020-11-25 DIAGNOSIS — C20 Malignant neoplasm of rectum: Secondary | ICD-10-CM

## 2020-11-26 ENCOUNTER — Inpatient Hospital Stay: Payer: BC Managed Care – PPO | Attending: Hematology

## 2020-11-26 ENCOUNTER — Encounter (HOSPITAL_COMMUNITY): Payer: Self-pay

## 2020-11-26 ENCOUNTER — Other Ambulatory Visit: Payer: Self-pay

## 2020-11-26 ENCOUNTER — Ambulatory Visit (HOSPITAL_COMMUNITY)
Admission: RE | Admit: 2020-11-26 | Discharge: 2020-11-26 | Disposition: A | Discharge status: Home / Self Care | Payer: BC Managed Care – PPO | Source: Ambulatory Visit | Attending: Hematology | Admitting: Hematology

## 2020-11-26 DIAGNOSIS — D123 Benign neoplasm of transverse colon: Secondary | ICD-10-CM | POA: Diagnosis not present

## 2020-11-26 DIAGNOSIS — D125 Benign neoplasm of sigmoid colon: Secondary | ICD-10-CM | POA: Insufficient documentation

## 2020-11-26 DIAGNOSIS — Z79899 Other long term (current) drug therapy: Secondary | ICD-10-CM | POA: Diagnosis not present

## 2020-11-26 DIAGNOSIS — J984 Other disorders of lung: Secondary | ICD-10-CM | POA: Diagnosis not present

## 2020-11-26 DIAGNOSIS — N281 Cyst of kidney, acquired: Secondary | ICD-10-CM | POA: Diagnosis not present

## 2020-11-26 DIAGNOSIS — I700 Atherosclerosis of aorta: Secondary | ICD-10-CM | POA: Diagnosis not present

## 2020-11-26 DIAGNOSIS — Z9049 Acquired absence of other specified parts of digestive tract: Secondary | ICD-10-CM | POA: Insufficient documentation

## 2020-11-26 DIAGNOSIS — D124 Benign neoplasm of descending colon: Secondary | ICD-10-CM | POA: Insufficient documentation

## 2020-11-26 DIAGNOSIS — Z21 Asymptomatic human immunodeficiency virus [HIV] infection status: Secondary | ICD-10-CM | POA: Insufficient documentation

## 2020-11-26 DIAGNOSIS — R748 Abnormal levels of other serum enzymes: Secondary | ICD-10-CM | POA: Insufficient documentation

## 2020-11-26 DIAGNOSIS — C20 Malignant neoplasm of rectum: Secondary | ICD-10-CM | POA: Diagnosis not present

## 2020-11-26 DIAGNOSIS — J8410 Pulmonary fibrosis, unspecified: Secondary | ICD-10-CM | POA: Diagnosis not present

## 2020-11-26 DIAGNOSIS — K8020 Calculus of gallbladder without cholecystitis without obstruction: Secondary | ICD-10-CM | POA: Insufficient documentation

## 2020-11-26 DIAGNOSIS — I10 Essential (primary) hypertension: Secondary | ICD-10-CM | POA: Insufficient documentation

## 2020-11-26 DIAGNOSIS — E785 Hyperlipidemia, unspecified: Secondary | ICD-10-CM | POA: Diagnosis not present

## 2020-11-26 DIAGNOSIS — D122 Benign neoplasm of ascending colon: Secondary | ICD-10-CM | POA: Insufficient documentation

## 2020-11-26 DIAGNOSIS — D7389 Other diseases of spleen: Secondary | ICD-10-CM | POA: Diagnosis not present

## 2020-11-26 DIAGNOSIS — R161 Splenomegaly, not elsewhere classified: Secondary | ICD-10-CM | POA: Diagnosis not present

## 2020-11-26 DIAGNOSIS — I2510 Atherosclerotic heart disease of native coronary artery without angina pectoris: Secondary | ICD-10-CM | POA: Diagnosis not present

## 2020-11-26 HISTORY — DX: Disorder of kidney and ureter, unspecified: N28.9

## 2020-11-26 LAB — CMP (CANCER CENTER ONLY)
ALT: 51 U/L — ABNORMAL HIGH (ref 0–44)
AST: 48 U/L — ABNORMAL HIGH (ref 15–41)
Albumin: 4.1 g/dL (ref 3.5–5.0)
Alkaline Phosphatase: 96 U/L (ref 38–126)
Anion gap: 10 (ref 5–15)
BUN: 16 mg/dL (ref 6–20)
CO2: 25 mmol/L (ref 22–32)
Calcium: 9.3 mg/dL (ref 8.9–10.3)
Chloride: 104 mmol/L (ref 98–111)
Creatinine: 1.05 mg/dL (ref 0.61–1.24)
GFR, Estimated: >60 mL/min (ref >60)
Glucose, Bld: 105 mg/dL — ABNORMAL HIGH (ref 70–99)
Potassium: 3.8 mmol/L (ref 3.5–5.1)
Sodium: 139 mmol/L (ref 135–145)
Total Bilirubin: 1.2 mg/dL (ref 0.3–1.2)
Total Protein: 7.1 g/dL (ref 6.5–8.1)

## 2020-11-26 LAB — CBC WITH DIFFERENTIAL (CANCER CENTER ONLY)
Abs Immature Granulocytes: 0.02 10*3/uL (ref 0.00–0.07)
Basophils Absolute: 0 10*3/uL (ref 0.0–0.1)
Basophils Relative: 0 %
Eosinophils Absolute: 0.2 10*3/uL (ref 0.0–0.5)
Eosinophils Relative: 3 %
HCT: 38 % — ABNORMAL LOW (ref 39.0–52.0)
Hemoglobin: 14.2 g/dL (ref 13.0–17.0)
Immature Granulocytes: 0 %
Lymphocytes Relative: 25 %
Lymphs Abs: 1.3 10*3/uL (ref 0.7–4.0)
MCH: 35.3 pg — ABNORMAL HIGH (ref 26.0–34.0)
MCHC: 37.4 g/dL — ABNORMAL HIGH (ref 30.0–36.0)
MCV: 94.5 fL (ref 80.0–100.0)
Monocytes Absolute: 0.4 10*3/uL (ref 0.1–1.0)
Monocytes Relative: 8 %
Neutro Abs: 3.4 10*3/uL (ref 1.7–7.7)
Neutrophils Relative %: 64 %
Platelet Count: 171 10*3/uL (ref 150–400)
RBC: 4.02 MIL/uL — ABNORMAL LOW (ref 4.22–5.81)
RDW: 11.8 % (ref 11.5–15.5)
WBC Count: 5.3 10*3/uL (ref 4.0–10.5)
nRBC: 0 % (ref 0.0–0.2)

## 2020-11-26 MED ORDER — IOHEXOL 300 MG/ML  SOLN
100.0000 mL | Freq: Once | INTRAMUSCULAR | Status: AC | PRN
  Administered 2020-11-26: 100 mL via INTRAVENOUS

## 2020-11-26 MED ORDER — SODIUM CHLORIDE (PF) 0.9 % IJ SOLN
INTRAMUSCULAR | Status: AC
  Filled 2020-11-26: qty 50

## 2020-11-27 ENCOUNTER — Other Ambulatory Visit (HOSPITAL_COMMUNITY): Payer: Self-pay

## 2020-11-28 ENCOUNTER — Other Ambulatory Visit: Payer: Self-pay

## 2020-11-28 ENCOUNTER — Inpatient Hospital Stay (HOSPITAL_BASED_OUTPATIENT_CLINIC_OR_DEPARTMENT_OTHER): Payer: BC Managed Care – PPO | Admitting: Hematology

## 2020-11-28 ENCOUNTER — Encounter: Payer: Self-pay | Admitting: Hematology

## 2020-11-28 VITALS — BP 148/89 | HR 64 | Temp 97.5°F | Resp 18 | Wt 216.8 lb

## 2020-11-28 DIAGNOSIS — R161 Splenomegaly, not elsewhere classified: Secondary | ICD-10-CM | POA: Diagnosis not present

## 2020-11-28 DIAGNOSIS — Z21 Asymptomatic human immunodeficiency virus [HIV] infection status: Secondary | ICD-10-CM | POA: Diagnosis not present

## 2020-11-28 DIAGNOSIS — D124 Benign neoplasm of descending colon: Secondary | ICD-10-CM | POA: Diagnosis not present

## 2020-11-28 DIAGNOSIS — D123 Benign neoplasm of transverse colon: Secondary | ICD-10-CM | POA: Diagnosis not present

## 2020-11-28 DIAGNOSIS — I700 Atherosclerosis of aorta: Secondary | ICD-10-CM | POA: Diagnosis not present

## 2020-11-28 DIAGNOSIS — K8020 Calculus of gallbladder without cholecystitis without obstruction: Secondary | ICD-10-CM | POA: Diagnosis not present

## 2020-11-28 DIAGNOSIS — Z79899 Other long term (current) drug therapy: Secondary | ICD-10-CM | POA: Diagnosis not present

## 2020-11-28 DIAGNOSIS — R748 Abnormal levels of other serum enzymes: Secondary | ICD-10-CM | POA: Diagnosis not present

## 2020-11-28 DIAGNOSIS — Z9049 Acquired absence of other specified parts of digestive tract: Secondary | ICD-10-CM | POA: Diagnosis not present

## 2020-11-28 DIAGNOSIS — E785 Hyperlipidemia, unspecified: Secondary | ICD-10-CM | POA: Diagnosis not present

## 2020-11-28 DIAGNOSIS — C20 Malignant neoplasm of rectum: Secondary | ICD-10-CM

## 2020-11-28 DIAGNOSIS — I10 Essential (primary) hypertension: Secondary | ICD-10-CM | POA: Diagnosis not present

## 2020-11-28 DIAGNOSIS — D122 Benign neoplasm of ascending colon: Secondary | ICD-10-CM | POA: Diagnosis not present

## 2020-11-28 DIAGNOSIS — D125 Benign neoplasm of sigmoid colon: Secondary | ICD-10-CM | POA: Diagnosis not present

## 2020-11-28 NOTE — Progress Notes (Signed)
[place]   Telephone:([phone]) [phone] [phone]) [phone]   Clinic Follow up Note   Patient Care Team: Merwyn Katos as PCP - General (Physician Assistant) Michel Bickers, MD as Consulting Physician (Infectious Diseases) Leighton Ruff, MD as Consulting Physician (General Surgery) Truitt Merle, MD as Consulting Physician (Hematology) Kyung Rudd, MD as Consulting Physician (Radiation Oncology)  Date of Service:  11/28/2020  CHIEF COMPLAINT: f/u of rectal cancer  SUMMARY OF ONCOLOGIC HISTORY: Oncology History Overview Note  Cancer Staging Rectal adenocarcinoma [facility]) Staging form: Colon and Rectum, AJCC 8th Edition - Clinical stage from 08/21/2019: Stage IIIB (cT3, cN1, cM0) - Signed by Truitt Merle, MD on 08/21/2019    Rectal adenocarcinoma ([place])  07/03/2019 Procedure   Colonoscopy by Dr. Collene Mares  IMPRESSION -Likely Malignant 5cm, circumferential, bleeding tumor in the rectum, 304 cm from the anal verge -biopsy done  -One 8 mm sessile polyp in the distal sigmoid colon, removed with a hot snare x2; resected and retrieved.  -Two small sessile polyps, 1 in the mid-descending colon and 1 in the mid transverse colon - removed by cold biopsies.  -One 47mm sessile polyp in the mid transverse colon, removed with a hot snare x1; resected and retrieved.  -One 72mm sessile polyp in the proximal ascending colon, removed with a hot snare X1; resected and retrieved.  -few large scattered diverticula.   07/03/2019 Initial Biopsy   FINAL DIAGNOSIS 07/03/19  A. Colon, Descending, Polyp, Polypectomy:   -TUBULAR ADENOMA   -No high grade dysplasia or malignancy.  B. Colon, transverse, polyp, polypectomy:   -TUBULAR ADENOMA  -No high grade dysplasia or malignancy. C. Colon, Ascending, Polyp, Polypectomy:   -Fragments of sessile serrated adenoma/polyp D. Colon, sigmoid, polyp, polypectomy:   -Fragments of Tubular Adenoma  -No high grade dysplasia or malignancy. E. Rectum, Mass,  Biopsy:   -INVASIVE COLONIC ADENOCARCINOMA, MODERATELY-DIFFERENTIATED, see comment     07/12/2019 Imaging   CT AP W Contrast 07/12/19  IMPRESSION: 1. No evidence of metastatic disease within the chest, abdomen or pelvis. 2. Possible mild wall thickening in the sigmoid colon, although no focal colonic mass is identified. There are few distal colonic diverticula. 3. Sequela of prior granulomatous disease. 4. Possible cholelithiasis with mild wall thickening of the gallbladder fundus. 5. Aortic Atherosclerosis (ICD10-I70.0).   08/21/2019 Initial Diagnosis   Rectal adenocarcinoma ([place])   08/21/2019 Cancer Staging   Staging form: Colon and Rectum, AJCC 8th Edition - Clinical stage from 08/21/2019: Stage IIIB (cT3, cN1, cM0) - Signed by Truitt Merle, MD on 08/21/2019   08/31/2019 - 12/07/2019 Chemotherapy   FOLFOX q2weeks starting 08/31/19. Removed 5FU bolus and increased steroids pre-meds due to thrombocytopenia and skin rash starting with C4. Completed 12/07/19    11/21/2019 Imaging   CT AP W contrast  IMPRESSION: 1. Stable exam. No evidence for metastatic disease within the abdomen or pelvis. 2. Splenomegaly.  New from previous exam. 3. Gallstones.   Aortic Atherosclerosis (ICD10-I70.0).   12/25/2019 - 02/01/2020 Chemotherapy   Concurrent chemoRT with Xeloda $RemoveBef'2000mg'EnIrVMjvoM$  in the AM and $Remo'1500mg'HTmqW$  in the PM on days of RT  12/25/19-7/15/2   12/25/2019 - 02/01/2020 Radiation Therapy   Concurrent  chemoRT with Dr Lisbeth Renshaw 12/25/19-02/01/20   04/03/2020 Surgery   XI ROBOTIC ASSISTED LOWER ANTERIOR RESECTION, SPLENIC FLEXURE MOBILIZATION, RIGID PROCTOSCOPY and DIVERTING LOOP ILEOSTOMY with Dr Malcolm Metro and Dr Johney Maine   04/03/2020 Pathology Results   FINAL MICROSCOPIC DIAGNOSIS:   A. RECTOSIGMOID COLON, LOW ANTERIOR RESECTION:  - No residual invasive carcinoma  status post neoadjuvant treatment.  - See oncology table.   B. FINAL DISTAL MARGIN, EXCISION:  - Unremarkable colonic mucosa.    06/12/2020 Surgery   ILEOSTOMY  REVERSAL and PAC removal by Dr Marcello Moores    11/26/2020 Imaging   CT C/A/P  IMPRESSION: 1. Changes of prior low anterior resection. Soft tissue nodularity in the presacral space/mesorectal fat, which are nonspecific and may reflect post treatment/surgical changes but disease involvement not excluded. Attention on close interval follow-up exams is recommended. 2. No evidence of metastatic disease within the chest. 3. Similar splenomegaly. 4. Cholelithiasis without evidence of acute cholecystitis. 5. Aortic atherosclerosis.      CURRENT THERAPY:  Surveillance  INTERVAL HISTORY:  Joshua Zhang is here for a follow up of rectal cancer. He was last seen by me on 08/02/20. He presents to the clinic alone. He had hernia surgery on 09/26/20. He notes some soreness, but this is improving.  He reports his bowel movements are "a lot." He notes he takes fiber medication three times a day, as well as Imodium once before each meal. The Imodium keeps him regular; if he forgets to take it, he will go several times a day. Without it, he notes he feels like he always needs to go but doesn't go, and he will experience cramping. He denies blood in his stool. He notes he has not been as active since his hernia surgery, which has caused him to gain weight. He is looking to be more active now that he's feeling better.   All other systems were reviewed with the patient and are negative.  MEDICAL HISTORY:  Past Medical History:  Diagnosis Date  . History of chemotherapy   . HIV (human immunodeficiency virus infection) ([place])   . Hypertension   . 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 Port-A-Cath in place 10/12/2019  . rectal ca dx'd 06/2019  . Renal insufficiency     SURGICAL HISTORY: Past Surgical History:  Procedure Laterality Date  . APPENDECTOMY     50 years old   . DIVERTING ILEOSTOMY N/A 04/03/2020   Procedure: DIVERTING LOOP ILEOSTOMY;  Surgeon: Leighton Ruff, MD;  Location: [facility]  ORS;  Service: General;  Laterality: N/A;  . FINGER SURGERY Right   . ILEOSTOMY CLOSURE N/A 06/12/2020   Procedure: ILEOSTOMY REVERSAL;  Surgeon: Leighton Ruff, MD;  Location: [facility] ORS;  Service: General;  Laterality: N/A;  . INCISIONAL HERNIA REPAIR N/A 09/26/2020   Procedure: LAPAROSCOPIC INCISIONAL HERNIA REPAIR WITH MESH;  Surgeon: Leighton Ruff, MD;  Location: [facility] ORS;  Service: General;  Laterality: N/A;  . IR IMAGING GUIDED PORT INSERTION  08/30/2019  . PORT-A-CATH REMOVAL Right 06/12/2020   Procedure: REMOVAL PORT-A-CATH;  Surgeon: Leighton Ruff, MD;  Location: [facility] ORS;  Service: General;  Laterality: Right;  . SHOULDER SURGERY Bilateral 50 years old  . WISDOM TOOTH EXTRACTION    . XI ROBOTIC ASSISTED LOWER ANTERIOR RESECTION N/A 04/03/2020   Procedure: XI ROBOTIC ASSISTED LOWER ANTERIOR RESECTION, SPLENIC FLEXURE MOBILIZATION, RIGID PROCTOSCOPY;  Surgeon: Leighton Ruff, MD;  Location: [facility] ORS;  Service: General;  Laterality: N/A;    I have reviewed the social history and family history with the patient and they are unchanged from previous note.  ALLERGIES:  has No Known Allergies.  MEDICATIONS:  Current Outpatient Medications  Medication Sig Dispense Refill  . amLODipine (NORVASC) 5 MG tablet Take 5 mg by mouth daily.     Marland Kitchen atorvastatin (LIPITOR) 10 MG tablet Take  10 mg by mouth daily.     . bictegravir-emtricitabine-tenofovir AF (BIKTARVY) 50-200-25 MG TABS tablet TAKE 1 TABLET BY MOUTH DAILY. 30 tablet 3  . losartan (COZAAR) 25 MG tablet Take 25 mg by mouth daily.     . Multiple Vitamin (MULTIVITAMIN) tablet Take 1 tablet by mouth daily.    Marland Kitchen oxyCODONE (OXY IR/ROXICODONE) 5 MG immediate release tablet Take 1-2 tablets (5-10 mg total) by mouth every 6 (six) hours as needed. 30 tablet 0   No current facility-administered medications for this visit.    PHYSICAL EXAMINATION: ECOG PERFORMANCE STATUS: 0 - Asymptomatic  Vitals:   11/28/20 0803  BP: (!) 148/89  Pulse: 64  Resp:  18  Temp: (!) 97.5 F (36.4 C)  SpO2: 99%   Filed Weights   11/28/20 0803  Weight: 216 lb 12.8 oz (98.3 kg)    Due to COVID19 we will limit examination to appearance. Patient had no complaints.  GENERAL:alert, no distress and comfortable SKIN: skin color normal, no rashes or significant lesions EYES: normal, Conjunctiva are pink and non-injected, sclera clear  NEURO: alert & oriented x 3 with fluent speech  LABORATORY DATA:  I have reviewed the data as listed CBC Latest Ref Rng & Units 11/26/2020 09/19/2020 08/02/2020  WBC 4.0 - 10.5 K/uL 5.3 4.4 4.8  Hemoglobin 13.0 - 17.0 g/dL 14.2 14.3 14.4  Hematocrit 39.0 - 52.0 % 38.0(L) 39.1 39.4  Platelets 150 - 400 K/uL 171 171 171     CMP Latest Ref Rng & Units 11/26/2020 09/19/2020 08/02/2020  Glucose 70 - 99 mg/dL 105(H) 111(H) 108(H)  BUN 6 - 20 mg/dL $Remove'16 18 16  'HeEoUTP$ Creatinine 0.61 - 1.24 mg/dL 1.05 0.95 0.98  Sodium 135 - 145 mmol/L 139 137 141  Potassium 3.5 - 5.1 mmol/L 3.8 3.8 3.9  Chloride 98 - 111 mmol/L 104 105 106  CO2 22 - 32 mmol/L $RemoveB'25 23 24  'QtmlrHhd$ Calcium 8.9 - 10.3 mg/dL 9.3 9.3 9.3  Total Protein 6.5 - 8.1 g/dL 7.1 - 7.2  Total Bilirubin 0.3 - 1.2 mg/dL 1.2 - 1.5(H)  Alkaline Phos 38 - 126 U/L 96 - 102  AST 15 - 41 U/L 48(H) - 33  ALT 0 - 44 U/L 51(H) - 42      RADIOGRAPHIC STUDIES: I have personally reviewed the radiological images as listed and agreed with the findings in the report. No results found.   ASSESSMENT & PLAN:  Joshua Zhang is a 50 y.o. male with   1.Low rectaladenocarcinoma,cT3N1M0,Stage IIIB, MMR normal, ypT0N0 -This was discovered on screening colonoscopyin 12/2020withlow rectum mass close to sphincter. Biopsy showed this is moderately-differentiated adenocarcinoma locally advanced. Based on MRI, this is cT3N1 stage III disease.  -He completed 8 cycles oftotal neoadjuvant chemotherapywithFOLFOXandconcurrentchemoRTwith Xeloda.  -He underwent surgery with Dr Marcello Moores on 04/04/11.  Overall he  had complete response to chemo/RT. I do not plan to give adjuvant treatment. He is currently on surveillance. -He underwent ileostomy takedown on 06/12/20.  He is clinically doing well, mild issue with bowel movement since surgery. -CT C/A/P on 11/26/20 showed nonspecific soft tissue nodularity in presacral space/mesorectal fat; no evidence of metastatic disease within chest. -I personally reviewed the CT scan images and discussed the results of his CT scan with him. I recommend a repeat scan in 3 months to f/u the nonspecific soft tissue nodule within presacral space to rule out cancer recurrence. -Labs reviewed. His liver enzymes are slightly elevated today (AST is 48, ALT is 51 11/28/20). I  discussed this can improve with diet and exercise.  He had elevated liver enzymes during and right after his chemotherapy also.  He is aware of this and has a plan to exercise more. 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 -Repeat CT in 3 months, with f/u after   2. HIV, controlled -He denies h/o or current use of recreational or IV drugs. He is unsure how he initially got infected.  -Controlled and undetectable. 09/15/18 CD4 was 350.His3/22/21 CD4 T cells is 708 -He is on Biktavy. This is managed by Dr. Megan Salon. -He has received both his COVID19 vaccines in 11/2019.I encouraged him to receive COVID booster  3. HLD, HTN -He will continue medications and f/u with PCP     PLAN: -He is clinically doing well, CT scan reviewed.   -F/u in 3 months with lab and CT AP w contrast a few days before.    No problem-specific Assessment & Plan notes found for this encounter.   Orders Placed This Encounter  Procedures  . CT Abdomen Pelvis W Contrast    Standing Status:   Future    Standing Expiration Date:   11/28/2021    Order Specific Question:   If indicated for the ordered procedure, I authorize the administration of contrast media per Radiology protocol    Answer:   Yes    Order  Specific Question:   Preferred imaging location?    Answer:   [facility]    Order Specific Question:   Release to patient    Answer:   Immediate    Order Specific Question:   Is Oral Contrast requested for this exam?    Answer:   Yes, Per Radiology protocol   All questions were answered. The patient knows to call the clinic with any problems, questions or concerns. No barriers to learning was detected. The total time spent in the appointment was 30 minutes.     Truitt Merle, MD 11/28/2020   I, Wilburn Mylar, am acting as scribe for Truitt Merle, MD.   I have reviewed the above documentation for accuracy and completeness, and I agree with the above.

## 2020-11-29 ENCOUNTER — Telehealth: Payer: Self-pay | Admitting: Hematology

## 2020-11-29 NOTE — Telephone Encounter (Signed)
Left message with follow-up appointments per 5/12 los. Gave option to call back to reschedule if needed.

## 2020-12-18 DIAGNOSIS — H04123 Dry eye syndrome of bilateral lacrimal glands: Secondary | ICD-10-CM | POA: Diagnosis not present

## 2020-12-23 ENCOUNTER — Other Ambulatory Visit: Payer: Self-pay | Admitting: Internal Medicine

## 2020-12-23 ENCOUNTER — Other Ambulatory Visit (HOSPITAL_COMMUNITY): Payer: Self-pay

## 2020-12-23 NOTE — Telephone Encounter (Signed)
Patient has appointment 12/24/20

## 2020-12-24 ENCOUNTER — Other Ambulatory Visit (HOSPITAL_COMMUNITY): Payer: Self-pay

## 2020-12-24 ENCOUNTER — Other Ambulatory Visit: Payer: Self-pay

## 2020-12-24 ENCOUNTER — Other Ambulatory Visit: Payer: BC Managed Care – PPO

## 2020-12-24 DIAGNOSIS — B20 Human immunodeficiency virus [HIV] disease: Secondary | ICD-10-CM

## 2020-12-24 MED ORDER — BIKTARVY 50-200-25 MG PO TABS
1.0000 | ORAL_TABLET | Freq: Every day | ORAL | 0 refills | Status: DC
  Filled 2020-12-24 – 2020-12-27 (×3): qty 30, 30d supply, fill #0

## 2020-12-25 ENCOUNTER — Other Ambulatory Visit (HOSPITAL_COMMUNITY): Payer: Self-pay

## 2020-12-25 ENCOUNTER — Telehealth: Payer: Self-pay

## 2020-12-25 ENCOUNTER — Encounter: Payer: Self-pay | Admitting: Hematology

## 2020-12-25 LAB — T-HELPER CELL (CD4) - (RCID CLINIC ONLY)
CD4 % Helper T Cell: 30 % — ABNORMAL LOW (ref 33–65)
CD4 T Cell Abs: 248 /uL — ABNORMAL LOW (ref 400–1790)

## 2020-12-25 NOTE — Telephone Encounter (Signed)
[facility] Patient Advocate Encounter   Received notification from [employer] that prior authorization for [employer] Odor is required.   PA submitted on 12/25/20 I faxed PA to [phone]  Status is pending    [facility] will continue to follow.   Ileene Patrick, [facility] Patient [facility] for Infectious Disease Phone: [phone] Fax:  [phone]

## 2020-12-26 ENCOUNTER — Encounter: Payer: Self-pay | Admitting: Hematology

## 2020-12-26 LAB — COMPREHENSIVE METABOLIC PANEL
AG Ratio: 1.8 (calc) (ref 1.0–2.5)
ALT: 36 U/L (ref 9–46)
AST: 33 U/L (ref 10–35)
Albumin: 4 g/dL (ref 3.6–5.1)
Alkaline phosphatase (APISO): 90 U/L (ref 35–144)
BUN: 14 mg/dL (ref 7–25)
CO2: 23 mmol/L (ref 20–32)
Calcium: 9 mg/dL (ref 8.6–10.3)
Chloride: 105 mmol/L (ref 98–110)
Creat: 0.92 mg/dL (ref 0.70–1.33)
Globulin: 2.2 g/dL (calc) (ref 1.9–3.7)
Glucose, Bld: 104 mg/dL — ABNORMAL HIGH (ref 65–99)
Potassium: 3.9 mmol/L (ref 3.5–5.3)
Sodium: 139 mmol/L (ref 135–146)
Total Bilirubin: 0.9 mg/dL (ref 0.2–1.2)
Total Protein: 6.2 g/dL (ref 6.1–8.1)

## 2020-12-26 LAB — HIV-1 RNA QUANT-NO REFLEX-BLD
HIV 1 RNA Quant: NOT DETECTED Copies/mL
HIV-1 RNA Quant, Log: NOT DETECTED Log cps/mL

## 2020-12-26 LAB — CBC
HCT: 41.1 % (ref 38.5–50.0)
Hemoglobin: 14.3 g/dL (ref 13.2–17.1)
MCH: 34.9 pg — ABNORMAL HIGH (ref 27.0–33.0)
MCHC: 34.8 g/dL (ref 32.0–36.0)
MCV: 100.2 fL — ABNORMAL HIGH (ref 80.0–100.0)
MPV: 9.6 fL (ref 7.5–12.5)
Platelets: 186 10*3/uL (ref 140–400)
RBC: 4.1 10*6/uL — ABNORMAL LOW (ref 4.20–5.80)
RDW: 12.7 % (ref 11.0–15.0)
WBC: 4.1 10*3/uL (ref 3.8–10.8)

## 2020-12-26 LAB — LIPID PANEL
Cholesterol: 224 mg/dL — ABNORMAL HIGH (ref <200)
HDL: 47 mg/dL (ref >=40)
LDL Cholesterol (Calc): 122 mg/dL (calc) — ABNORMAL HIGH
Non-HDL Cholesterol (Calc): 177 mg/dL (calc) — ABNORMAL HIGH (ref <130)
Total CHOL/HDL Ratio: 4.8 (calc) (ref <5.0)
Triglycerides: 384 mg/dL — ABNORMAL HIGH (ref <150)

## 2020-12-26 LAB — RPR: RPR Ser Ql: NONREACTIVE

## 2020-12-27 ENCOUNTER — Telehealth: Payer: Self-pay

## 2020-12-27 ENCOUNTER — Other Ambulatory Visit (HOSPITAL_COMMUNITY): Payer: Self-pay

## 2020-12-27 NOTE — Telephone Encounter (Signed)
[facility] Patient Advocate Encounter  Prior Authorization for Phillips Odor has been approved.    Effective dates: 12/25/20 through 12/24/21  Patients co-pay is $0.00.   [facility] will continue to follow.  Ileene Patrick, [facility] Patient [facility] for Infectious Disease Phone: [phone] Fax:  [phone]

## 2021-01-08 ENCOUNTER — Other Ambulatory Visit: Payer: Self-pay

## 2021-01-08 ENCOUNTER — Ambulatory Visit (INDEPENDENT_AMBULATORY_CARE_PROVIDER_SITE_OTHER): Payer: BC Managed Care – PPO | Admitting: Internal Medicine

## 2021-01-08 ENCOUNTER — Other Ambulatory Visit (HOSPITAL_COMMUNITY): Payer: Self-pay

## 2021-01-08 ENCOUNTER — Encounter: Payer: Self-pay | Admitting: Internal Medicine

## 2021-01-08 DIAGNOSIS — B20 Human immunodeficiency virus [HIV] disease: Secondary | ICD-10-CM

## 2021-01-08 MED ORDER — BIKTARVY 50-200-25 MG PO TABS
1.0000 | ORAL_TABLET | Freq: Every day | ORAL | 11 refills | Status: DC
  Filled 2021-01-08 – 2021-01-22 (×2): qty 30, 30d supply, fill #0
  Filled 2021-02-18: qty 30, 30d supply, fill #1
  Filled 2021-03-20: qty 30, 30d supply, fill #2
  Filled 2021-04-21: qty 30, 30d supply, fill #3
  Filled 2021-05-21: qty 30, 30d supply, fill #4
  Filled 2021-06-25: qty 30, 30d supply, fill #5
  Filled 2021-07-18: qty 30, 30d supply, fill #6
  Filled 2021-08-19 – 2021-08-27 (×2): qty 30, 30d supply, fill #7
  Filled 2021-09-22: qty 30, 30d supply, fill #8
  Filled 2021-10-20: qty 30, 30d supply, fill #9
  Filled 2021-11-25: qty 30, 30d supply, fill #10
  Filled 2021-12-19: qty 30, 30d supply, fill #11

## 2021-01-08 NOTE — Progress Notes (Signed)
Patient Active Problem List   Diagnosis Date Noted   Rectal adenocarcinoma ([place]) 08/21/2019    Priority: High   HIV disease ([place]) 11/11/2016    Priority: High   Status post reversal of ileostomy 06/14/2020   Hyperglycemia 06/14/2020   Neuropathy    Hypertension    History of chemotherapy    Ileostomy present ([place]) 06/12/2020   Impaired fasting glucose 02/02/2020   Injury of tendon of triceps 03/03/2019   Mixed hyperlipidemia 07/26/2018   Low back pain 09/29/2017   Essential hypertension 04/01/2017   Renal insufficiency 03/31/2017   Dyslipidemia 03/31/2017   Tinea versicolor 04/22/2016    Patient's Medications  New Prescriptions   No medications on file  Previous Medications   AMLODIPINE (NORVASC) 5 MG TABLET    Take 5 mg by mouth daily.    ATORVASTATIN (LIPITOR) 10 MG TABLET    Take 10 mg by mouth daily.    LOSARTAN (COZAAR) 25 MG TABLET    Take 25 mg by mouth daily.    MULTIPLE VITAMIN (MULTIVITAMIN) TABLET    Take 1 tablet by mouth daily.   OXYCODONE (OXY IR/ROXICODONE) 5 MG IMMEDIATE RELEASE TABLET    Take 1-2 tablets (5-10 mg total) by mouth every 6 (six) hours as needed.  Modified Medications   Modified Medication Previous Medication   BICTEGRAVIR-EMTRICITABINE-TENOFOVIR AF (BIKTARVY) 50-200-25 MG TABS TABLET bictegravir-emtricitabine-tenofovir AF (BIKTARVY) 50-200-25 MG TABS tablet      TAKE 1 TABLET BY MOUTH DAILY.    TAKE 1 TABLET BY MOUTH DAILY.  Discontinued Medications   No medications on file    Subjective: Joshua Zhang is in for his routine HIV follow-up visit.  He denies any problems obtaining, taking or tolerating his Biktarvy and does not believe he has missed any doses.  He completed chemotherapy and radiation for rectal carcinoma last year.  His ileostomy was reversed in November.  His bowel function continues to be erratic but is improving.  He is starting to get more regular exercise.  He has taken his COVID booster vaccine.  Review of  Systems: Review of Systems  Constitutional:  Negative for fever and weight loss.  Respiratory:  Negative for cough.   Cardiovascular:  Negative for chest pain.  Gastrointestinal:  Positive for constipation and diarrhea. Negative for abdominal pain, nausea and vomiting.   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    Port-A-Cath in place 10/12/2019   rectal ca dx'd 06/2019   Renal insufficiency     Social History   Tobacco Use   Smoking status: Former    Packs/day: 0.25    Years: 5.00    Pack years: 1.25    Types: Cigarettes    Quit date: 2003    Years since quitting: 19.4   Smokeless tobacco: Never   Tobacco comments:    Quit 18 years ago  Vaping Use   Vaping Use: Never used  Substance Use Topics   Alcohol use: Yes    Comment: 5-6 driniks per week    Drug use: Not Currently    Family History  Problem Relation Age of Onset   Breast cancer Mother    Aortic aneurysm Father     No Known Allergies  Health Maintenance  Topic Date Due   Pneumococcal Vaccine 70-75 Years old (  1 - PCV) Never done   Zoster Vaccines- Shingrix (1 of 2) Never done   COVID-19 Vaccine (3 - Pfizer risk series) 01/10/2020   INFLUENZA VACCINE  02/17/2021   TETANUS/TDAP  10/22/2022   COLONOSCOPY (Pts 45-67yrs Insurance coverage will need to be confirmed)  09/06/2030   Hepatitis C Screening  Completed   HIV Screening  Completed   HPV VACCINES  Aged Out    Objective:  Vitals:   01/08/21 0843  BP: (!) 158/94  Pulse: 68  Temp: 98.3 F (36.8 C)  TempSrc: Oral  Weight: 211 lb (95.7 kg)   Body mass index is 29.43 kg/m.  Physical Exam Constitutional:      Comments: He is in good spirits.  Cardiovascular:     Rate and Rhythm: Normal rate and regular rhythm.     Heart sounds: No murmur heard. Pulmonary:     Effort: Pulmonary effort is normal.      Breath sounds: Normal breath sounds.  Abdominal:     Palpations: Abdomen is soft.     Tenderness: There is no abdominal tenderness.  Psychiatric:        Mood and Affect: Mood normal.    Lab Results Lab Results  Component Value Date   WBC 4.1 12/24/2020   HGB 14.3 12/24/2020   HCT 41.1 12/24/2020   MCV 100.2 (H) 12/24/2020   PLT 186 12/24/2020    Lab Results  Component Value Date   CREATININE 0.92 12/24/2020   BUN 14 12/24/2020   NA 139 12/24/2020   K 3.9 12/24/2020   CL 105 12/24/2020   CO2 23 12/24/2020    Lab Results  Component Value Date   ALT 36 12/24/2020   AST 33 12/24/2020   ALKPHOS 96 11/26/2020   BILITOT 0.9 12/24/2020    Lab Results  Component Value Date   CHOL 224 (H) 12/24/2020   HDL 47 12/24/2020   LDLCALC 122 (H) 12/24/2020   TRIG 384 (H) 12/24/2020   CHOLHDL 4.8 12/24/2020   Lab Results  Component Value Date   LABRPR NON-REACTIVE 12/24/2020   HIV 1 RNA Quant  Date Value  12/24/2020 Not Detected Copies/mL  06/07/2020 <20 Copies/mL (H)  10/09/2019 39 copies/mL (H)   CD4 T Cell Abs (/uL)  Date Value  12/24/2020 248 (L)  06/07/2020 294 (L)  10/09/2019 708     Problem List Items Addressed This Visit       High   HIV disease ([place])    His infection remains under excellent, long-term control.  His CD4 has been slightly below normal ever since he underwent chemotherapy but it is not in a dangerous range should lead to opportunistic infection.  He will continue Biktarvy and follow-up after lab work in 1 year.       Relevant Medications   bictegravir-emtricitabine-tenofovir AF (BIKTARVY) 50-200-25 MG TABS tablet   Other Relevant Orders   CBC   T-helper cell (CD4)- ([facility] clinic only)   Comprehensive metabolic panel   Lipid panel   RPR   HIV-1 RNA quant-no reflex-bld      Michel Bickers, MD [facility] for [place] 336 [phone] pager   [phone] cell 01/08/2021, 8:55 AM

## 2021-01-08 NOTE — Assessment & Plan Note (Signed)
His infection remains under excellent, long-term control.  His CD4 has been slightly below normal ever since he underwent chemotherapy but it is not in a dangerous range should lead to opportunistic infection.  He will continue Biktarvy and follow-up after lab work in 1 year.

## 2021-01-21 ENCOUNTER — Other Ambulatory Visit (HOSPITAL_COMMUNITY): Payer: Self-pay

## 2021-01-22 ENCOUNTER — Other Ambulatory Visit (HOSPITAL_COMMUNITY): Payer: Self-pay

## 2021-01-27 ENCOUNTER — Other Ambulatory Visit (HOSPITAL_COMMUNITY): Payer: Self-pay

## 2021-02-18 ENCOUNTER — Other Ambulatory Visit (HOSPITAL_COMMUNITY): Payer: Self-pay

## 2021-02-19 ENCOUNTER — Other Ambulatory Visit (HOSPITAL_COMMUNITY): Payer: Self-pay

## 2021-02-24 ENCOUNTER — Encounter: Payer: Self-pay | Admitting: *Deleted

## 2021-02-24 ENCOUNTER — Other Ambulatory Visit: Payer: Self-pay

## 2021-02-24 ENCOUNTER — Encounter: Payer: Self-pay | Admitting: Hematology

## 2021-02-24 ENCOUNTER — Other Ambulatory Visit: Payer: Self-pay | Admitting: Nurse Practitioner

## 2021-02-24 DIAGNOSIS — R4589 Other symptoms and signs involving emotional state: Secondary | ICD-10-CM

## 2021-02-24 DIAGNOSIS — C20 Malignant neoplasm of rectum: Secondary | ICD-10-CM

## 2021-02-24 NOTE — Progress Notes (Signed)
[place] Work  Holiday representative received request to contact patients spouse for emotional support and mental resources for patients.  CSW contacted [name],  patients spouse by phone to offer support and assess for needs.  [name] stated patient was experiencing increased feelings of anxiety and depression.  [name] stated patient did not express any plans of self harm and stated he was open to emotional support, counseling and/or medication for mental health care.  [name] stated patient identified adjustment to illness, fear of recurrence or progression as major concerns and causes of distress.  CSW provided support and discussed support resources.  CSW provided [name] with contact information for [facility] and Mobile Crisis number.  CSW also share referral information for Dr. Elias Else with [facility].  [name] plans to call [place] [facility] today.  CSW also requested [facility] provider to place referral through Epic.  CSW provided contact information and encouraged [name] and patient to call with questions or concerns.    Johnnye Lana, MSW, LCSW, OSW-C Clinical Social Worker [facility] [phone]

## 2021-02-25 ENCOUNTER — Inpatient Hospital Stay: Payer: BC Managed Care – PPO | Attending: Hematology

## 2021-02-25 DIAGNOSIS — K8020 Calculus of gallbladder without cholecystitis without obstruction: Secondary | ICD-10-CM | POA: Insufficient documentation

## 2021-02-25 DIAGNOSIS — E785 Hyperlipidemia, unspecified: Secondary | ICD-10-CM | POA: Insufficient documentation

## 2021-02-25 DIAGNOSIS — R161 Splenomegaly, not elsewhere classified: Secondary | ICD-10-CM | POA: Insufficient documentation

## 2021-02-25 DIAGNOSIS — I700 Atherosclerosis of aorta: Secondary | ICD-10-CM | POA: Insufficient documentation

## 2021-02-25 DIAGNOSIS — C20 Malignant neoplasm of rectum: Secondary | ICD-10-CM | POA: Insufficient documentation

## 2021-02-25 DIAGNOSIS — D122 Benign neoplasm of ascending colon: Secondary | ICD-10-CM | POA: Insufficient documentation

## 2021-02-25 DIAGNOSIS — Z9221 Personal history of antineoplastic chemotherapy: Secondary | ICD-10-CM | POA: Insufficient documentation

## 2021-02-25 DIAGNOSIS — Z9049 Acquired absence of other specified parts of digestive tract: Secondary | ICD-10-CM | POA: Insufficient documentation

## 2021-02-25 DIAGNOSIS — G4700 Insomnia, unspecified: Secondary | ICD-10-CM | POA: Insufficient documentation

## 2021-02-25 DIAGNOSIS — D123 Benign neoplasm of transverse colon: Secondary | ICD-10-CM | POA: Insufficient documentation

## 2021-02-25 DIAGNOSIS — Z21 Asymptomatic human immunodeficiency virus [HIV] infection status: Secondary | ICD-10-CM | POA: Insufficient documentation

## 2021-02-25 DIAGNOSIS — D124 Benign neoplasm of descending colon: Secondary | ICD-10-CM | POA: Insufficient documentation

## 2021-02-25 DIAGNOSIS — Z79899 Other long term (current) drug therapy: Secondary | ICD-10-CM | POA: Insufficient documentation

## 2021-02-25 DIAGNOSIS — I10 Essential (primary) hypertension: Secondary | ICD-10-CM | POA: Insufficient documentation

## 2021-02-25 DIAGNOSIS — F419 Anxiety disorder, unspecified: Secondary | ICD-10-CM | POA: Insufficient documentation

## 2021-02-25 DIAGNOSIS — D125 Benign neoplasm of sigmoid colon: Secondary | ICD-10-CM | POA: Insufficient documentation

## 2021-02-26 ENCOUNTER — Inpatient Hospital Stay: Payer: BC Managed Care – PPO

## 2021-02-26 ENCOUNTER — Other Ambulatory Visit (HOSPITAL_COMMUNITY): Payer: Self-pay

## 2021-02-26 ENCOUNTER — Ambulatory Visit (HOSPITAL_COMMUNITY)
Admission: RE | Admit: 2021-02-26 | Discharge: 2021-02-26 | Disposition: A | Discharge status: Home / Self Care | Payer: BC Managed Care – PPO | Source: Ambulatory Visit | Attending: Hematology | Admitting: Hematology

## 2021-02-26 ENCOUNTER — Other Ambulatory Visit: Payer: Self-pay

## 2021-02-26 DIAGNOSIS — D123 Benign neoplasm of transverse colon: Secondary | ICD-10-CM | POA: Diagnosis not present

## 2021-02-26 DIAGNOSIS — C20 Malignant neoplasm of rectum: Secondary | ICD-10-CM | POA: Diagnosis not present

## 2021-02-26 DIAGNOSIS — K8020 Calculus of gallbladder without cholecystitis without obstruction: Secondary | ICD-10-CM | POA: Diagnosis not present

## 2021-02-26 DIAGNOSIS — I10 Essential (primary) hypertension: Secondary | ICD-10-CM | POA: Diagnosis not present

## 2021-02-26 DIAGNOSIS — Z79899 Other long term (current) drug therapy: Secondary | ICD-10-CM | POA: Diagnosis not present

## 2021-02-26 DIAGNOSIS — Z9221 Personal history of antineoplastic chemotherapy: Secondary | ICD-10-CM | POA: Diagnosis not present

## 2021-02-26 DIAGNOSIS — D125 Benign neoplasm of sigmoid colon: Secondary | ICD-10-CM | POA: Diagnosis not present

## 2021-02-26 DIAGNOSIS — N281 Cyst of kidney, acquired: Secondary | ICD-10-CM | POA: Diagnosis not present

## 2021-02-26 DIAGNOSIS — F419 Anxiety disorder, unspecified: Secondary | ICD-10-CM | POA: Diagnosis not present

## 2021-02-26 DIAGNOSIS — G4700 Insomnia, unspecified: Secondary | ICD-10-CM | POA: Diagnosis not present

## 2021-02-26 DIAGNOSIS — E785 Hyperlipidemia, unspecified: Secondary | ICD-10-CM | POA: Diagnosis not present

## 2021-02-26 DIAGNOSIS — Z9049 Acquired absence of other specified parts of digestive tract: Secondary | ICD-10-CM | POA: Diagnosis not present

## 2021-02-26 DIAGNOSIS — Z21 Asymptomatic human immunodeficiency virus [HIV] infection status: Secondary | ICD-10-CM | POA: Diagnosis not present

## 2021-02-26 DIAGNOSIS — C19 Malignant neoplasm of rectosigmoid junction: Secondary | ICD-10-CM | POA: Diagnosis not present

## 2021-02-26 DIAGNOSIS — D124 Benign neoplasm of descending colon: Secondary | ICD-10-CM | POA: Diagnosis not present

## 2021-02-26 DIAGNOSIS — D7389 Other diseases of spleen: Secondary | ICD-10-CM | POA: Diagnosis not present

## 2021-02-26 DIAGNOSIS — I700 Atherosclerosis of aorta: Secondary | ICD-10-CM | POA: Diagnosis not present

## 2021-02-26 DIAGNOSIS — R161 Splenomegaly, not elsewhere classified: Secondary | ICD-10-CM | POA: Diagnosis not present

## 2021-02-26 DIAGNOSIS — D122 Benign neoplasm of ascending colon: Secondary | ICD-10-CM | POA: Diagnosis not present

## 2021-02-26 LAB — CMP (CANCER CENTER ONLY)
ALT: 48 U/L — ABNORMAL HIGH (ref 0–44)
AST: 40 U/L (ref 15–41)
Albumin: 4 g/dL (ref 3.5–5.0)
Alkaline Phosphatase: 102 U/L (ref 38–126)
Anion gap: 10 (ref 5–15)
BUN: 11 mg/dL (ref 6–20)
CO2: 25 mmol/L (ref 22–32)
Calcium: 9.8 mg/dL (ref 8.9–10.3)
Chloride: 105 mmol/L (ref 98–111)
Creatinine: 1 mg/dL (ref 0.61–1.24)
GFR, Estimated: >60 mL/min (ref >60)
Glucose, Bld: 106 mg/dL — ABNORMAL HIGH (ref 70–99)
Potassium: 4 mmol/L (ref 3.5–5.1)
Sodium: 140 mmol/L (ref 135–145)
Total Bilirubin: 1.7 mg/dL — ABNORMAL HIGH (ref 0.3–1.2)
Total Protein: 7.2 g/dL (ref 6.5–8.1)

## 2021-02-26 LAB — CBC WITH DIFFERENTIAL (CANCER CENTER ONLY)
Abs Immature Granulocytes: 0.01 10*3/uL (ref 0.00–0.07)
Basophils Absolute: 0 10*3/uL (ref 0.0–0.1)
Basophils Relative: 0 %
Eosinophils Absolute: 0.1 10*3/uL (ref 0.0–0.5)
Eosinophils Relative: 2 %
HCT: 40.6 % (ref 39.0–52.0)
Hemoglobin: 15 g/dL (ref 13.0–17.0)
Immature Granulocytes: 0 %
Lymphocytes Relative: 17 %
Lymphs Abs: 0.8 10*3/uL (ref 0.7–4.0)
MCH: 35.1 pg — ABNORMAL HIGH (ref 26.0–34.0)
MCHC: 36.9 g/dL — ABNORMAL HIGH (ref 30.0–36.0)
MCV: 95.1 fL (ref 80.0–100.0)
Monocytes Absolute: 0.4 10*3/uL (ref 0.1–1.0)
Monocytes Relative: 8 %
Neutro Abs: 3.6 10*3/uL (ref 1.7–7.7)
Neutrophils Relative %: 73 %
Platelet Count: 184 10*3/uL (ref 150–400)
RBC: 4.27 MIL/uL (ref 4.22–5.81)
RDW: 11.8 % (ref 11.5–15.5)
WBC Count: 5 10*3/uL (ref 4.0–10.5)
nRBC: 0 % (ref 0.0–0.2)

## 2021-02-26 MED ORDER — IOHEXOL 350 MG/ML SOLN
80.0000 mL | Freq: Once | INTRAVENOUS | Status: AC | PRN
  Administered 2021-02-26: 80 mL via INTRAVENOUS

## 2021-02-27 ENCOUNTER — Encounter: Payer: Self-pay | Admitting: Hematology

## 2021-02-28 ENCOUNTER — Encounter: Payer: Self-pay | Admitting: Hematology

## 2021-02-28 ENCOUNTER — Other Ambulatory Visit: Payer: Self-pay

## 2021-02-28 ENCOUNTER — Inpatient Hospital Stay (HOSPITAL_BASED_OUTPATIENT_CLINIC_OR_DEPARTMENT_OTHER): Payer: BC Managed Care – PPO | Admitting: Hematology

## 2021-02-28 VITALS — BP 160/99 | HR 75 | Temp 98.4°F | Resp 18 | Ht 71.0 in | Wt 212.3 lb

## 2021-02-28 DIAGNOSIS — D122 Benign neoplasm of ascending colon: Secondary | ICD-10-CM | POA: Diagnosis not present

## 2021-02-28 DIAGNOSIS — I700 Atherosclerosis of aorta: Secondary | ICD-10-CM | POA: Diagnosis not present

## 2021-02-28 DIAGNOSIS — D125 Benign neoplasm of sigmoid colon: Secondary | ICD-10-CM | POA: Diagnosis not present

## 2021-02-28 DIAGNOSIS — D123 Benign neoplasm of transverse colon: Secondary | ICD-10-CM | POA: Diagnosis not present

## 2021-02-28 DIAGNOSIS — I10 Essential (primary) hypertension: Secondary | ICD-10-CM | POA: Diagnosis not present

## 2021-02-28 DIAGNOSIS — R161 Splenomegaly, not elsewhere classified: Secondary | ICD-10-CM | POA: Diagnosis not present

## 2021-02-28 DIAGNOSIS — C20 Malignant neoplasm of rectum: Secondary | ICD-10-CM

## 2021-02-28 DIAGNOSIS — K8020 Calculus of gallbladder without cholecystitis without obstruction: Secondary | ICD-10-CM | POA: Diagnosis not present

## 2021-02-28 DIAGNOSIS — G4700 Insomnia, unspecified: Secondary | ICD-10-CM | POA: Diagnosis not present

## 2021-02-28 DIAGNOSIS — Z9221 Personal history of antineoplastic chemotherapy: Secondary | ICD-10-CM | POA: Diagnosis not present

## 2021-02-28 DIAGNOSIS — Z21 Asymptomatic human immunodeficiency virus [HIV] infection status: Secondary | ICD-10-CM | POA: Diagnosis not present

## 2021-02-28 DIAGNOSIS — F419 Anxiety disorder, unspecified: Secondary | ICD-10-CM | POA: Diagnosis not present

## 2021-02-28 DIAGNOSIS — Z9049 Acquired absence of other specified parts of digestive tract: Secondary | ICD-10-CM | POA: Diagnosis not present

## 2021-02-28 DIAGNOSIS — D124 Benign neoplasm of descending colon: Secondary | ICD-10-CM | POA: Diagnosis not present

## 2021-02-28 DIAGNOSIS — Z79899 Other long term (current) drug therapy: Secondary | ICD-10-CM | POA: Diagnosis not present

## 2021-02-28 DIAGNOSIS — E785 Hyperlipidemia, unspecified: Secondary | ICD-10-CM | POA: Diagnosis not present

## 2021-02-28 MED ORDER — SERTRALINE HCL 50 MG PO TABS: 50.0000 mg | ORAL_TABLET | Freq: Every day | ORAL | 1 refills | Status: DC

## 2021-02-28 NOTE — Progress Notes (Signed)
[name]   Telephone:([phone]) [phone] Fax:(336) [phone]   Clinic Follow up Note   Patient Care Team: Merwyn Katos as PCP - General (Physician Assistant) Michel Bickers, MD as Consulting Physician (Infectious Diseases) Leighton Ruff, MD as Consulting Physician (General Surgery) Truitt Merle, MD as Consulting Physician (Hematology) Kyung Rudd, MD as Consulting Physician (Radiation Oncology)  Date of Service:  02/28/2021  CHIEF COMPLAINT: f/u of rectal cancer  SUMMARY OF ONCOLOGIC HISTORY: Oncology History Overview Note  Cancer Staging Rectal adenocarcinoma [facility]) Staging form: Colon and Rectum, AJCC 8th Edition - Clinical stage from 08/21/2019: Stage IIIB (cT3, cN1, cM0) - Signed by Truitt Merle, MD on 08/21/2019 Stage prefix: Initial diagnosis Histologic grade (G): G2 Histologic grading system: 4 grade system    Rectal adenocarcinoma ([place])  07/03/2019 Procedure   Colonoscopy by Dr. Collene Mares  IMPRESSION -Likely Malignant 5cm, circumferential, bleeding tumor in the rectum, 304 cm from the anal verge -biopsy done  -One 8 mm sessile polyp in the distal sigmoid colon, removed with a hot snare x2; resected and retrieved.  -Two small sessile polyps, 1 in the mid-descending colon and 1 in the mid transverse colon - removed by cold biopsies.  -One 22mm sessile polyp in the mid transverse colon, removed with a hot snare x1; resected and retrieved.  -One 36mm sessile polyp in the proximal ascending colon, removed with a hot snare X1; resected and retrieved.  -few large scattered diverticula.   07/03/2019 Initial Biopsy   FINAL DIAGNOSIS 07/03/19  A. Colon, Descending, Polyp, Polypectomy:   -TUBULAR ADENOMA   -No high grade dysplasia or malignancy.  B. Colon, transverse, polyp, polypectomy:   -TUBULAR ADENOMA  -No high grade dysplasia or malignancy. C. Colon, Ascending, Polyp, Polypectomy:   -Fragments of sessile serrated adenoma/polyp D. Colon, sigmoid, polyp,  polypectomy:   -Fragments of Tubular Adenoma  -No high grade dysplasia or malignancy. E. Rectum, Mass, Biopsy:   -INVASIVE COLONIC ADENOCARCINOMA, MODERATELY-DIFFERENTIATED, see comment     07/12/2019 Imaging   CT AP W Contrast 07/12/19  IMPRESSION: 1. No evidence of metastatic disease within the chest, abdomen or pelvis. 2. Possible mild wall thickening in the sigmoid colon, although no focal colonic mass is identified. There are few distal colonic diverticula. 3. Sequela of prior granulomatous disease. 4. Possible cholelithiasis with mild wall thickening of the gallbladder fundus. 5. Aortic Atherosclerosis (ICD10-I70.0).   08/21/2019 Initial Diagnosis   Rectal adenocarcinoma ([place])   08/21/2019 Cancer Staging   Staging form: Colon and Rectum, AJCC 8th Edition - Clinical stage from 08/21/2019: Stage IIIB (cT3, cN1, cM0) - Signed by Truitt Merle, MD on 08/21/2019   08/31/2019 - 12/07/2019 Chemotherapy   FOLFOX q2weeks starting 08/31/19. Removed 5FU bolus and increased steroids pre-meds due to thrombocytopenia and skin rash starting with C4. Completed 12/07/19    11/21/2019 Imaging   CT AP W contrast  IMPRESSION: 1. Stable exam. No evidence for metastatic disease within the abdomen or pelvis. 2. Splenomegaly.  New from previous exam. 3. Gallstones.   Aortic Atherosclerosis (ICD10-I70.0).   12/25/2019 - 02/01/2020 Chemotherapy   Concurrent chemoRT with Xeloda $RemoveBef'2000mg'yGyrregFpe$  in the AM and $Remo'1500mg'oewLn$  in the PM on days of RT  12/25/19-7/15/2   12/25/2019 - 02/01/2020 Radiation Therapy   Concurrent  chemoRT with Dr Lisbeth Renshaw 12/25/19-02/01/20   04/03/2020 Surgery   XI ROBOTIC ASSISTED LOWER ANTERIOR RESECTION, SPLENIC FLEXURE MOBILIZATION, RIGID PROCTOSCOPY and DIVERTING LOOP ILEOSTOMY with Dr Malcolm Metro and Dr Johney Maine   04/03/2020 Pathology Results   FINAL MICROSCOPIC DIAGNOSIS:  A. RECTOSIGMOID COLON, LOW ANTERIOR RESECTION:  - No residual invasive carcinoma status post neoadjuvant treatment.  - See oncology table.    B. FINAL DISTAL MARGIN, EXCISION:  - Unremarkable colonic mucosa.    06/12/2020 Surgery   ILEOSTOMY REVERSAL and PAC removal by Dr Marcello Moores    11/26/2020 Imaging   CT C/A/P  IMPRESSION: 1. Changes of prior low anterior resection. Soft tissue nodularity in the presacral space/mesorectal fat, which are nonspecific and may reflect post treatment/surgical changes but disease involvement not excluded. Attention on close interval follow-up exams is recommended. 2. No evidence of metastatic disease within the chest. 3. Similar splenomegaly. 4. Cholelithiasis without evidence of acute cholecystitis. 5. Aortic atherosclerosis.   02/26/2021 Imaging   IMPRESSION: 1. Status post low anterior resection. Nodularity in the presacral space is stable to minimally increased in the interval. Continued close attention recommended as recurrent disease not excluded. 2. Cholelithiasis. 3. Aortic Atherosclerosis (ICD10-I70.0).      CURRENT THERAPY:  Surveillance  INTERVAL HISTORY:  Joshua Zhang is here for a follow up of rectal cancer. He was last seen by me on 11/28/20. He presents to the clinic accompanied by his wife. He reports bowel urgency and notes he goes to the bathroom about 10-15 times a day. He notes sometimes he has stool and other times not. He reports his stool is formed. He wonders if it is related to food, but if it is, he is unsure how to change it.   All other systems were reviewed with the patient and are negative.  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    Port-A-Cath in place 10/12/2019   rectal ca dx'd 06/2019   Renal insufficiency     SURGICAL HISTORY: Past Surgical History:  Procedure Laterality Date   APPENDECTOMY     50 years old    DIVERTING ILEOSTOMY N/A 04/03/2020   Procedure: DIVERTING  LOOP ILEOSTOMY;  Surgeon: Leighton Ruff, MD;  Location: [facility];  Service: General;  Laterality: N/A;   FINGER SURGERY Right    ILEOSTOMY CLOSURE N/A 06/12/2020   Procedure: ILEOSTOMY REVERSAL;  Surgeon: Leighton Ruff, MD;  Location: [facility];  Service: General;  Laterality: N/A;   [place] N/A 09/26/2020   Procedure: LAPAROSCOPIC INCISIONAL HERNIA REPAIR WITH MESH;  Surgeon: Leighton Ruff, MD;  Location: [facility];  Service: General;  Laterality: N/A;   IR IMAGING GUIDED PORT INSERTION  08/30/2019   PORT-A-CATH REMOVAL Right 06/12/2020   Procedure: REMOVAL PORT-A-CATH;  Surgeon: Leighton Ruff, MD;  Location: [facility];  Service: General;  Laterality: Right;   SHOULDER SURGERY Bilateral 50 years old   [place] N/A 04/03/2020   Procedure: XI ROBOTIC ASSISTED LOWER ANTERIOR RESECTION, SPLENIC FLEXURE MOBILIZATION, RIGID PROCTOSCOPY;  Surgeon: Leighton Ruff, MD;  Location: [facility];  Service: General;  Laterality: N/A;    I have reviewed the social history and family history with the patient and they are unchanged from previous note.  ALLERGIES:  has No Known Allergies.  MEDICATIONS:  Current Outpatient Medications  Medication Sig Dispense Refill   sertraline (ZOLOFT) 50 MG tablet Take 1 tablet (50 mg total) by mouth daily. 30 tablet 1   amLODipine (NORVASC) 5 MG tablet Take 5 mg by mouth daily.  atorvastatin (LIPITOR) 10 MG tablet Take 10 mg by mouth daily.      bictegravir-emtricitabine-tenofovir AF (BIKTARVY) 50-200-25 MG TABS tablet TAKE 1 TABLET BY MOUTH DAILY. 30 tablet 11   losartan (COZAAR) 25 MG tablet Take 25 mg by mouth daily.      Multiple Vitamin (MULTIVITAMIN) tablet Take 1 tablet by mouth daily.     oxyCODONE (OXY IR/ROXICODONE) 5 MG immediate release tablet Take 1-2 tablets (5-10 mg total) by mouth every 6 (six) hours as needed. (Patient not taking: Reported on 01/08/2021) 30 tablet 0   No current  facility-administered medications for this visit.    PHYSICAL EXAMINATION: ECOG PERFORMANCE STATUS: 0 - Asymptomatic  Vitals:   02/28/21 1428  BP: (!) 160/99  Pulse: 75  Resp: 18  Temp: 98.4 F (36.9 C)  SpO2: 100%   Wt Readings from Last 3 Encounters:  02/28/21 212 lb 4.8 oz (96.3 kg)  01/08/21 211 lb (95.7 kg)  11/28/20 216 lb 12.8 oz (98.3 kg)     GENERAL:alert, no distress and comfortable SKIN: skin color, texture, turgor are normal, no rashes or significant lesions EYES: normal, Conjunctiva are pink and non-injected, sclera clear  LUNGS: clear to auscultation and percussion with normal breathing effort HEART: regular rate & rhythm and no murmurs and no lower extremity edema ABDOMEN:abdomen soft, non-tender and normal bowel sounds Musculoskeletal:no cyanosis of digits and no clubbing  NEURO: alert & oriented x 3 with fluent speech, no focal motor/sensory deficits RECTAL: No palpable mass. No blood on glove. Normal stool present. Benign exam    LABORATORY DATA:  I have reviewed the data as listed CBC Latest Ref Rng & Units 02/26/2021 12/24/2020 11/26/2020  WBC 4.0 - 10.5 K/uL 5.0 4.1 5.3  Hemoglobin 13.0 - 17.0 g/dL 15.0 14.3 14.2  Hematocrit 39.0 - 52.0 % 40.6 41.1 38.0(L)  Platelets 150 - 400 K/uL 184 186 171     CMP Latest Ref Rng & Units 02/26/2021 12/24/2020 11/26/2020  Glucose 70 - 99 mg/dL 106(H) 104(H) 105(H)  BUN 6 - 20 mg/dL $Remove'11 14 16  'BRNDeMk$ Creatinine 0.61 - 1.24 mg/dL 1.00 0.92 1.05  Sodium 135 - 145 mmol/L 140 139 139  Potassium 3.5 - 5.1 mmol/L 4.0 3.9 3.8  Chloride 98 - 111 mmol/L 105 105 104  CO2 22 - 32 mmol/L $RemoveB'25 23 25  'bqvyMFtu$ Calcium 8.9 - 10.3 mg/dL 9.8 9.0 9.3  Total Protein 6.5 - 8.1 g/dL 7.2 6.2 7.1  Total Bilirubin 0.3 - 1.2 mg/dL 1.7(H) 0.9 1.2  Alkaline Phos 38 - 126 U/L 102 - 96  AST 15 - 41 U/L 40 33 48(H)  ALT 0 - 44 U/L 48(H) 36 51(H)      RADIOGRAPHIC STUDIES: I have personally reviewed the radiological images as listed and agreed with the  findings in the report. No results found.   ASSESSMENT & PLAN:  Joshua Zhang is a 50 y.o. male with   1. Low rectal adenocarcinoma, cT3N1M0, Stage IIIB, MMR normal, ypT0N0 -This was discovered on screening colonoscopy in 06/2019 with low rectum mass close to sphincter. Biopsy showed this is moderately-differentiated adenocarcinoma locally advanced. Based on MRI, this is cT3N1 stage III disease.  -He completed 8 cycles of total neoadjuvant chemotherapy with FOLFOX and concurrent chemoRT with Xeloda.  -He underwent surgery with Dr Marcello Moores on 04/04/11.  Overall he had complete response to chemo/RT. I do not plan to give adjuvant treatment. He is currently on surveillance. -He underwent ileostomy takedown on 06/12/20.  He developed a  hernia and underwent surgery for this 09/26/20 -CT C/A/P on 11/26/20 showed nonspecific soft tissue nodularity in presacral space/mesorectal fat; no evidence of metastatic disease within chest. -CT A/P on 02/26/21 showed stability to minimal increase in node (from 32mm to 32mm) in presacral space. I discussed the scan results with Dr. Tery Sanfilippo in radiology. He recommend proceeding with PET to further evaluate, as a biopsy would be difficult to perform due to location. I reviewed the results and my discussion with Dr. Tery Sanfilippo with the patient and his wife. I discussed that if PET positive, I will reach out to some colleagues to see if it is able to be biopsied. I also discussed it could be related to his surgery or HIV and asked if he had any infection recently, which he denies. He agrees to proceed with PET.   2. Anxiety -new, related to cancer diagnosis and concern for recurrence, worsened recently -causes insomnia, and he tried one 3 mg melatonin with no relief and 2 (6 mg) with some relief. I advised he can increase to 10 mg.  -increased alcohol use, per MyChart note from wife [name] on 02/24/21. -he is scheduled to meet with psychologist Dr. Michail Sermon on Monday (03/03/21).  They note he does not prescribe medication. -I will call in zoloft for him, we discussed the benefit and side effects, he agrees to proceed.   3. HIV, controlled  -He denies h/o or current use of recreational or IV drugs. He is unsure how he initially got infected.  -Controlled and undetectable. 09/15/18 CD4 was 350. His 10/09/19 CD4 T cells is 708  -He is on Biktavy. This is managed by Dr. Megan Salon.   -He has received both his COVID19 vaccines in 11/2019. I encouraged him to receive COVID booster    4. Comorbidities: HLD, HTN -He will continue medications and f/u with PCP      PLAN:  -I will order PET scan to be done in next few weeks and call them with the results -lab and f/u in 3 months if PET negative     No problem-specific Assessment & Plan notes found for this encounter.   Orders Placed This Encounter  Procedures   NM PET Image Initial (PI) Skull Base To Thigh    Abnormal CT, rule out cancer recurrence    Standing Status:   Future    Standing Expiration Date:   02/28/2022    Order Specific Question:   If indicated for the ordered procedure, I authorize the administration of a radiopharmaceutical per Radiology protocol    Answer:   Yes    Order Specific Question:   Preferred imaging location?    Answer:   [facility]    All questions were answered. The patient knows to call the clinic with any problems, questions or concerns. No barriers to learning was detected. The total time spent in the appointment was 30 minutes.     Truitt Merle, MD 02/28/2021   I, Wilburn Mylar, am acting as scribe for Truitt Merle, MD.   I have reviewed the above documentation for accuracy and completeness, and I agree with the above.

## 2021-03-03 ENCOUNTER — Ambulatory Visit: Payer: BC Managed Care – PPO | Admitting: Psychologist

## 2021-03-03 ENCOUNTER — Telehealth: Payer: Self-pay | Admitting: Hematology

## 2021-03-03 NOTE — Telephone Encounter (Signed)
Left message with follow-up appointment per 8/12 los.

## 2021-03-06 ENCOUNTER — Ambulatory Visit (INDEPENDENT_AMBULATORY_CARE_PROVIDER_SITE_OTHER): Payer: BC Managed Care – PPO | Admitting: Psychologist

## 2021-03-06 DIAGNOSIS — F320 Major depressive disorder, single episode, mild: Secondary | ICD-10-CM | POA: Diagnosis not present

## 2021-03-06 DIAGNOSIS — F411 Generalized anxiety disorder: Secondary | ICD-10-CM

## 2021-03-18 ENCOUNTER — Ambulatory Visit (INDEPENDENT_AMBULATORY_CARE_PROVIDER_SITE_OTHER): Payer: BC Managed Care – PPO | Admitting: Psychologist

## 2021-03-18 DIAGNOSIS — F411 Generalized anxiety disorder: Secondary | ICD-10-CM | POA: Diagnosis not present

## 2021-03-18 DIAGNOSIS — F320 Major depressive disorder, single episode, mild: Secondary | ICD-10-CM | POA: Diagnosis not present

## 2021-03-20 ENCOUNTER — Other Ambulatory Visit (HOSPITAL_COMMUNITY): Payer: Self-pay

## 2021-03-21 ENCOUNTER — Ambulatory Visit (HOSPITAL_COMMUNITY)
Admission: RE | Admit: 2021-03-21 | Discharge: 2021-03-21 | Disposition: A | Discharge status: Home / Self Care | Payer: BC Managed Care – PPO | Source: Ambulatory Visit | Attending: Hematology | Admitting: Hematology

## 2021-03-21 ENCOUNTER — Other Ambulatory Visit: Payer: Self-pay

## 2021-03-21 DIAGNOSIS — C189 Malignant neoplasm of colon, unspecified: Secondary | ICD-10-CM | POA: Diagnosis not present

## 2021-03-21 DIAGNOSIS — K8020 Calculus of gallbladder without cholecystitis without obstruction: Secondary | ICD-10-CM | POA: Diagnosis not present

## 2021-03-21 DIAGNOSIS — I2510 Atherosclerotic heart disease of native coronary artery without angina pectoris: Secondary | ICD-10-CM | POA: Diagnosis not present

## 2021-03-21 DIAGNOSIS — J8410 Pulmonary fibrosis, unspecified: Secondary | ICD-10-CM | POA: Diagnosis not present

## 2021-03-21 DIAGNOSIS — C20 Malignant neoplasm of rectum: Secondary | ICD-10-CM | POA: Diagnosis not present

## 2021-03-21 LAB — GLUCOSE, CAPILLARY: Glucose-Capillary: 99 mg/dL (ref 70–99)

## 2021-03-21 MED ORDER — FLUDEOXYGLUCOSE F - 18 (FDG) INJECTION
11.0000 | Freq: Once | INTRAVENOUS | Status: AC | PRN
  Administered 2021-03-21: 9.9 via INTRAVENOUS

## 2021-03-26 ENCOUNTER — Other Ambulatory Visit (HOSPITAL_COMMUNITY): Payer: Self-pay

## 2021-03-28 ENCOUNTER — Telehealth: Payer: Self-pay | Admitting: Hematology

## 2021-03-28 NOTE — Telephone Encounter (Signed)
I attempted to call patient to review his PET scan findings but he did not answer.  I called his wife that PET scan findings with her in detail.  The presacral soft tissue lesion was slightly hypermetabolic on the PET, and the size has slightly increased from last scan in May.  Local recurrence is not excluded.  I will review in GI tumor board, to see if it is possible to biopsy.  If not, we will repeat a CT scan in 3 months.  I will also refer him back to Dr. Marcello Moores for a anoscope, due to the hypermetabolic uptake in the anal region. She voiced good understanding and agrees with the plan.  She is understandably very concerned about the possibility of cancer recurrence, I answered all her questions.  She will relay the message to patient.  Truitt Merle  03/28/2021

## 2021-04-03 ENCOUNTER — Ambulatory Visit (INDEPENDENT_AMBULATORY_CARE_PROVIDER_SITE_OTHER): Payer: BC Managed Care – PPO | Admitting: Psychologist

## 2021-04-03 DIAGNOSIS — F320 Major depressive disorder, single episode, mild: Secondary | ICD-10-CM

## 2021-04-03 DIAGNOSIS — F411 Generalized anxiety disorder: Secondary | ICD-10-CM

## 2021-04-09 ENCOUNTER — Other Ambulatory Visit: Payer: Self-pay | Admitting: Hematology

## 2021-04-09 ENCOUNTER — Encounter (HOSPITAL_COMMUNITY): Payer: Self-pay | Admitting: Radiology

## 2021-04-09 ENCOUNTER — Other Ambulatory Visit: Payer: Self-pay

## 2021-04-09 DIAGNOSIS — C20 Malignant neoplasm of rectum: Secondary | ICD-10-CM

## 2021-04-09 NOTE — Progress Notes (Signed)
Patient Name  Costantino, Kohlbeck Legal Sex  Male DOB  1971-03-08 SSN  [id number] Address  [address]  [place] [address] Phone  [phone] [facility])  [phone] (Work)  [phone] (Mobile)    FW: CT Biopsy Received: Today Suttle, Rosanne Ashing, MD  Garth Bigness D Approved for CT guided presacral mass/lymph node biopsy.   [place]        Previous Messages   ----- Message -----  From: Sandi Mariscal, MD  Sent: 04/09/2021  10:35 AM EDT  To: Suzette Battiest, MD, Jillyn Hidden  Subject: RE: CT Biopsy                                   Dr. Serafina Royals to approve.   Cathren Harsh  ----- Message -----  From: Garth Bigness D  Sent: 04/09/2021   7:55 AM EDT  To: Ir Procedure Requests  Subject: CT Biopsy                                       Procedure:  CT Biopsy   Reason:   Rectal adenocarcinoma, Rule out local recurrence,  Hypermetabolic presacral soft tissue, suspicious for recurrence, biopsy approved by radiologist at tumor board today   History:  CT, NM PET in computer   Provider:   Truitt Merle   Provider Contact:   [phone]

## 2021-04-09 NOTE — Progress Notes (Signed)
The proposed treatment discussed in conference is for discussion purpose only and is not a binding recommendation.  The patients have not been physically examined, or presented with their treatment options.  Therefore, final treatment plans cannot be decided.  

## 2021-04-18 ENCOUNTER — Other Ambulatory Visit (HOSPITAL_COMMUNITY): Payer: Self-pay

## 2021-04-21 ENCOUNTER — Other Ambulatory Visit (HOSPITAL_COMMUNITY): Payer: Self-pay

## 2021-04-23 ENCOUNTER — Other Ambulatory Visit: Payer: Self-pay

## 2021-04-23 DIAGNOSIS — J392 Other diseases of pharynx: Secondary | ICD-10-CM

## 2021-04-24 ENCOUNTER — Other Ambulatory Visit (HOSPITAL_COMMUNITY): Payer: Self-pay

## 2021-04-24 NOTE — Progress Notes (Signed)
I spoke with Joshua Zhang, Joshua Zhang's wife regarding the nodule found in his right nasopharynx on PET scan.  I let her know that Dr Burr Medico has placed a referral for ENT, DR Lucia Gaskins for evaluation.  She verbalized understanding.

## 2021-04-28 ENCOUNTER — Other Ambulatory Visit (HOSPITAL_COMMUNITY): Payer: Self-pay | Admitting: Physician Assistant

## 2021-04-29 ENCOUNTER — Ambulatory Visit (HOSPITAL_COMMUNITY)
Admission: RE | Admit: 2021-04-29 | Discharge: 2021-04-29 | Disposition: A | Discharge status: Home / Self Care | Payer: BC Managed Care – PPO | Source: Ambulatory Visit | Attending: Hematology | Admitting: Hematology

## 2021-04-29 ENCOUNTER — Encounter (HOSPITAL_COMMUNITY): Payer: Self-pay

## 2021-04-29 ENCOUNTER — Other Ambulatory Visit: Payer: Self-pay

## 2021-04-29 DIAGNOSIS — Z7901 Long term (current) use of anticoagulants: Secondary | ICD-10-CM | POA: Diagnosis not present

## 2021-04-29 DIAGNOSIS — R599 Enlarged lymph nodes, unspecified: Secondary | ICD-10-CM | POA: Diagnosis not present

## 2021-04-29 DIAGNOSIS — Z87891 Personal history of nicotine dependence: Secondary | ICD-10-CM | POA: Insufficient documentation

## 2021-04-29 DIAGNOSIS — Z21 Asymptomatic human immunodeficiency virus [HIV] infection status: Secondary | ICD-10-CM | POA: Diagnosis not present

## 2021-04-29 DIAGNOSIS — Z79899 Other long term (current) drug therapy: Secondary | ICD-10-CM | POA: Diagnosis not present

## 2021-04-29 DIAGNOSIS — Z803 Family history of malignant neoplasm of breast: Secondary | ICD-10-CM | POA: Insufficient documentation

## 2021-04-29 DIAGNOSIS — C20 Malignant neoplasm of rectum: Secondary | ICD-10-CM | POA: Insufficient documentation

## 2021-04-29 DIAGNOSIS — I10 Essential (primary) hypertension: Secondary | ICD-10-CM | POA: Diagnosis not present

## 2021-04-29 DIAGNOSIS — R590 Localized enlarged lymph nodes: Secondary | ICD-10-CM | POA: Diagnosis not present

## 2021-04-29 LAB — CBC WITH DIFFERENTIAL/PLATELET
Abs Immature Granulocytes: 0.01 10*3/uL (ref 0.00–0.07)
Basophils Absolute: 0 10*3/uL (ref 0.0–0.1)
Basophils Relative: 1 %
Eosinophils Absolute: 0.1 10*3/uL (ref 0.0–0.5)
Eosinophils Relative: 2 %
HCT: 39.7 % (ref 39.0–52.0)
Hemoglobin: 14.5 g/dL (ref 13.0–17.0)
Immature Granulocytes: 0 %
Lymphocytes Relative: 22 %
Lymphs Abs: 0.8 10*3/uL (ref 0.7–4.0)
MCH: 35.3 pg — ABNORMAL HIGH (ref 26.0–34.0)
MCHC: 36.5 g/dL — ABNORMAL HIGH (ref 30.0–36.0)
MCV: 96.6 fL (ref 80.0–100.0)
Monocytes Absolute: 0.4 10*3/uL (ref 0.1–1.0)
Monocytes Relative: 10 %
Neutro Abs: 2.4 10*3/uL (ref 1.7–7.7)
Neutrophils Relative %: 65 %
Platelets: 148 10*3/uL — ABNORMAL LOW (ref 150–400)
RBC: 4.11 MIL/uL — ABNORMAL LOW (ref 4.22–5.81)
RDW: 12 % (ref 11.5–15.5)
WBC: 3.7 10*3/uL — ABNORMAL LOW (ref 4.0–10.5)
nRBC: 0 % (ref 0.0–0.2)

## 2021-04-29 LAB — PROTIME-INR
INR: 1 (ref 0.8–1.2)
Prothrombin Time: 13 seconds (ref 11.4–15.2)

## 2021-04-29 MED ORDER — MIDAZOLAM HCL 2 MG/2ML IJ SOLN
INTRAMUSCULAR | Status: AC
  Filled 2021-04-29: qty 4

## 2021-04-29 MED ORDER — MIDAZOLAM HCL 2 MG/2ML IJ SOLN
INTRAMUSCULAR | Status: DC | PRN
  Administered 2021-04-29 (×4): 1 mg via INTRAVENOUS

## 2021-04-29 MED ORDER — FENTANYL CITRATE (PF) 100 MCG/2ML IJ SOLN
INTRAMUSCULAR | Status: DC | PRN
  Administered 2021-04-29 (×2): 50 ug via INTRAVENOUS

## 2021-04-29 MED ORDER — FENTANYL CITRATE (PF) 100 MCG/2ML IJ SOLN
INTRAMUSCULAR | Status: AC
  Filled 2021-04-29: qty 2

## 2021-04-29 MED ORDER — LIDOCAINE HCL (PF) 1 % IJ SOLN
INTRAMUSCULAR | Status: DC | PRN
  Administered 2021-04-29: 15 mL

## 2021-04-29 MED ORDER — SODIUM CHLORIDE 0.9 % IV SOLN: INTRAVENOUS | Status: DC

## 2021-04-29 NOTE — H&P (Addendum)
Chief Complaint: Patient was seen in consultation today for presacral mass/lymph node biopsy  at the request of Feng,Yan  Referring Physician(s): Feng,Yan  Supervising Physician: Jacqulynn Cadet  Patient Status: [name]  History of Present Illness: Joshua Zhang is a 50 y.o. male with PMH of HIV, HTN, for neuropathy from chemotherapy, rectal adenocarcinoma and renal insufficiency.  Patient was diagnosed 08/21/2019 with rectal adenocarcinoma followed by course of chemotherapy and surgeries including ileostomy reversal.  Patient had PET scan 03/23/2021 that resulted with presacral nodule more conspicuous than in May 2022 creased in size from 9 mm to previously millimeter.  Was referred today by Dr. Burr Medico for presacral mass/lymph node biopsy. PET scan 03/23/2021: IMPRESSION: 1. Small presacral nodule which appears minimally more conspicuous since May 2022, measuring 9 mm today compared to 6 mm than shows low level hypermetabolism. Continued close attention on follow-up recommended as recurrent/metastatic disease is a concern. 2. Hypermetabolic FDG uptake in the anal region likely physiologic. Attention on follow-up recommended. 3. No evidence for distant metastases in the chest, abdomen, or pelvis. 4. Hepatic steatosis. 5. Cholelithiasis. 6.  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    Port-A-Cath in place 10/12/2019   rectal ca dx'd 06/2019   Renal insufficiency     Past Surgical History:  Procedure Laterality Date   APPENDECTOMY     50 years old    DIVERTING ILEOSTOMY N/A 04/03/2020   Procedure: DIVERTING LOOP ILEOSTOMY;  Surgeon: Leighton Ruff, MD;  Location: [facility];  Service: General;  Laterality: N/A;   FINGER SURGERY Right    ILEOSTOMY CLOSURE N/A 06/12/2020   Procedure: ILEOSTOMY  REVERSAL;  Surgeon: Leighton Ruff, MD;  Location: [facility];  Service: General;  Laterality: N/A;   [place] N/A 09/26/2020   Procedure: LAPAROSCOPIC INCISIONAL HERNIA REPAIR WITH MESH;  Surgeon: Leighton Ruff, MD;  Location: [facility];  Service: General;  Laterality: N/A;   IR IMAGING GUIDED PORT INSERTION  08/30/2019   PORT-A-CATH REMOVAL Right 06/12/2020   Procedure: REMOVAL PORT-A-CATH;  Surgeon: Leighton Ruff, MD;  Location: [facility];  Service: General;  Laterality: Right;   SHOULDER SURGERY Bilateral 50 years old   [place] N/A 04/03/2020   Procedure: XI ROBOTIC ASSISTED LOWER ANTERIOR RESECTION, SPLENIC FLEXURE MOBILIZATION, RIGID PROCTOSCOPY;  Surgeon: Leighton Ruff, MD;  Location: [facility];  Service: General;  Laterality: N/A;    Allergies: Patient has no known allergies.  Medications: Prior to Admission medications   Medication Sig Start Date End Date Taking? Authorizing Provider  amLODipine (NORVASC) 5 MG tablet Take 5 mg by mouth daily.  07/26/18  Yes [provider]  atorvastatin (LIPITOR) 10 MG tablet Take 10 mg by mouth daily.  07/26/18  Yes [provider]  bictegravir-emtricitabine-tenofovir AF (BIKTARVY) 50-200-25 MG TABS tablet TAKE 1 TABLET BY MOUTH DAILY. 01/08/21 01/08/22 Yes Michel Bickers, MD  losartan (COZAAR) 25 MG tablet Take 25 mg by mouth daily.  07/04/19  Yes [provider]  Multiple Vitamin (MULTIVITAMIN) tablet Take 1 tablet by mouth daily.   Yes [provider]  sertraline (ZOLOFT) 50 MG tablet Take 1 tablet (50 mg total) by mouth daily. 02/28/21  Yes Truitt Merle, MD  oxyCODONE (OXY IR/ROXICODONE) 5 MG immediate release  tablet Take 1-2 tablets (5-10 mg total) by mouth every 6 (six) hours as needed. Patient not taking: Reported on 01/08/2021 1/91/47   Leighton Ruff, MD     Family History  Problem Relation Age of Onset   Breast cancer Mother    Aortic  aneurysm Father     Social History   Socioeconomic History   Marital status: Married    Spouse name: [name]   Number of children: 1   Years of education: some college    Highest education level: Not on file  Occupational History   Occupation: Press photographer    Comment: Previously Sports administrator   Tobacco Use   Smoking status: Former    Packs/day: 0.25    Years: 5.00    Pack years: 1.25    Types: Cigarettes    Quit date: 2003    Years since quitting: 19.7   Smokeless tobacco: Never   Tobacco comments:    Quit 18 years ago  Vaping Use   Vaping Use: Never used  Substance and Sexual Activity   Alcohol use: Yes    Comment: 5-6 driniks per week    Drug use: Not Currently   Sexual activity: Not Currently    Partners: Female    Comment: declined condoms 01/08/21  Other Topics Concern   Not on file  Social History Narrative   Not on file   Social Determinants of Health   Financial Resource Strain: Not on file  Food Insecurity: Not on file  Transportation Needs: Not on file  Physical Activity: Not on file  Stress: Not on file  Social Connections: Not on file     Review of Systems: A 12 point ROS discussed and pertinent positives are indicated in the HPI above.  All other systems are negative.  Review of Systems  All other systems reviewed and are negative.  Vital Signs: BP 136/90   Pulse 70   Temp 98.2 F (36.8 C) (Oral)   Resp 18   SpO2 100%   Physical Exam Vitals reviewed.  Constitutional:      Appearance: Normal appearance. He is not ill-appearing.  HENT:     Head: Normocephalic and atraumatic.     Mouth/Throat:     Mouth: Mucous membranes are moist.     Pharynx: Oropharynx is clear.  Cardiovascular:     Rate and Rhythm: Normal rate and regular rhythm.     Pulses: Normal pulses.     Heart sounds: Normal heart sounds. No murmur heard.   No gallop.  Pulmonary:     Effort: Pulmonary effort is normal. No respiratory distress.     Breath sounds:  Normal breath sounds. No stridor. No wheezing, rhonchi or rales.  Abdominal:     General: There is no distension.     Palpations: Abdomen is soft.     Tenderness: There is no abdominal tenderness. There is no guarding.     Comments: Patient has healed scar to RLQ from ileostomy reversal  Musculoskeletal:     Right lower leg: No edema.     Left lower leg: No edema.  Skin:    General: Skin is warm and dry.     Comments: Healed scar to right upper chest from previous port a cath  Neurological:     Mental Status: He is alert and oriented to person, place, and time.  Psychiatric:        Mood and Affect: Mood normal.        Behavior: Behavior  normal.        Thought Content: Thought content normal.        Judgment: Judgment normal.    Imaging: No results found.  Labs:  CBC: Recent Labs    11/26/20 1109 12/24/20 0851 02/26/21 0807 04/29/21 0803  WBC 5.3 4.1 5.0 3.7*  HGB 14.2 14.3 15.0 14.5  HCT 38.0* 41.1 40.6 39.7  PLT 171 186 184 148*    COAGS: No results for input(s): INR, APTT in the last 8760 hours.  BMP: Recent Labs    08/02/20 0819 09/19/20 0816 11/26/20 1109 12/24/20 0851 02/26/21 0807  NA 141 137 139 139 140  K 3.9 3.8 3.8 3.9 4.0  CL 106 105 104 105 105  CO2 24 23 25 23 25   GLUCOSE 108* 111* 105* 104* 106*  BUN 16 18 16 14 11   CALCIUM 9.3 9.3 9.3 9.0 9.8  CREATININE 0.98 0.95 1.05 0.92 1.00  GFRNONAA >60 >60 >60  --  >60    LIVER FUNCTION TESTS: Recent Labs    05/03/20 0818 08/02/20 0819 11/26/20 1109 12/24/20 0851 02/26/21 0807  BILITOT 1.2 1.5* 1.2 0.9 1.7*  AST 68* 33 48* 33 40  ALT 98* 42 51* 36 48*  ALKPHOS 177* 102 96  --  102  PROT 7.3 7.2 7.1 6.2 7.2  ALBUMIN 4.0 4.0 4.1  --  4.0    TUMOR MARKERS: No results for input(s): AFPTM, CEA, CA199, CHROMGRNA in the last 8760 hours.  Assessment and Plan: History of HIV, HTN, for neuropathy from chemotherapy, rectal adenocarcinoma and renal insufficiency.  Patient was diagnosed  08/21/2019 with rectal adenocarcinoma followed by course of chemotherapy and surgeries including ileostomy reversal.  Patient had PET scan 03/23/2021 that resulted with presacral nodule more conspicuous than in May 2022 creased in size from 9 mm to previously millimeter.  Was referred today by Dr. Burr Medico for presacral mass/lymph node biopsy.  Patient resting on stretcher.  He is alert and oriented, calm and pleasant. Patient states he is NPO per order. He denies the use of blood thinning medication. He is no distress VSS  Today's labs:  WBC 3.7   Risks and benefits of presacral mass/lymph node biopsy was discussed with the patient and/or patient's family including, but not limited to bleeding, infection, damage to adjacent structures or low yield requiring additional tests.  All of the questions were answered and there is agreement to proceed.  Consent signed and in chart.   Thank you for this interesting consult.  I greatly enjoyed meeting MAHAD NEWSTROM and look forward to participating in their care.  A copy of this report was sent to the requesting provider on this date.  Electronically Signed: Tyson Alias, NP 04/29/2021, 8:27 AM   I spent a total of 30-minute in face to face in clinical consultation, greater than 50% of which was counseling/coordinating care for presacral mass/lymph node biopsy.

## 2021-04-29 NOTE — Discharge Instructions (Signed)
Please call Interventional Radiology clinic [phone] with any questions or concerns.   You may remove your dressing and shower tomorrow.    Needle Biopsy, Care After These instructions tell you how to care for yourself after your procedure. Your doctor may also give you more specific instructions. Call your doctor if youhave any problems or questions. What can I expect after the procedure? After the procedure, it is common to have: Soreness. Bruising. Mild pain. Follow these instructions at home:  Return to your normal activities as told by your doctor. Ask your doctor what activities are safe for you. Take over-the-counter and prescription medicines only as told by your doctor. Wash your hands with soap and water before you change your bandage (dressing). If you cannot use soap and water, use hand sanitizer. Follow instructions from your doctor about: How to take care of your puncture site. When and how to change your bandage. When to remove your bandage. Check your puncture site every day for signs of infection. Watch for: Redness, swelling, or pain. Fluid or blood. Pus or a bad smell. Warmth. Do not take baths, swim, or use a hot tub until your doctor approves. Ask your doctor if you may take showers. You may only be allowed to take sponge baths. Keep all follow-up visits as told by your doctor. This is important. Contact a doctor if you have: A fever. Redness, swelling, or pain at the puncture site, and it lasts longer than a few days. Fluid, blood, or pus coming from the puncture site. Warmth coming from the puncture site. Get help right away if: You have a lot of bleeding from the puncture site. Summary After the procedure, it is common to have soreness, bruising, or mild pain at the puncture site. Check your puncture site every day for signs of infection, such as redness, swelling, or pain. Get help right away if you have severe bleeding from your puncture  site. This information is not intended to replace advice given to you by your health care provider. Make sure you discuss any questions you have with your healthcare provider. Document Revised: 01/04/2020 Document Reviewed: 01/04/2020 Elsevier Patient Education  2022 Elsevier Inc.   Moderate Conscious Sedation, Adult, Care After This sheet gives you information about how to care for yourself after your procedure. Your health care provider may also give you more specific instructions. If you have problems or questions, contact your health care provider. What can I expect after the procedure? After the procedure, it is common to have: Sleepiness for several hours. Impaired judgment for several hours. Difficulty with balance. Vomiting if you eat too soon. Follow these instructions at home: For the time period you were told by your health care provider: Rest. Do not participate in activities where you could fall or become injured. Do not drive or use machinery. Do not drink alcohol. Do not take sleeping pills or medicines that cause drowsiness. Do not make important decisions or sign legal documents. Do not take care of children on your own.      Eating and drinking Follow the diet recommended by your health care provider. Drink enough fluid to keep your urine pale yellow. If you vomit: Drink water, juice, or soup when you can drink without vomiting. Make sure you have little or no nausea before eating solid foods.   General instructions Take over-the-counter and prescription medicines only as told by your health care provider. Have a responsible adult stay with you for the time you are told.   It is important to have someone help care for you until you are awake and alert. Do not smoke. Keep all follow-up visits as told by your health care provider. This is important. Contact a health care provider if: You are still sleepy or having trouble with balance after 24 hours. You feel  light-headed. You keep feeling nauseous or you keep vomiting. You develop a rash. You have a fever. You have redness or swelling around the IV site. Get help right away if: You have trouble breathing. You have new-onset confusion at home. Summary After the procedure, it is common to feel sleepy, have impaired judgment, or feel nauseous if you eat too soon. Rest after you get home. Know the things you should not do after the procedure. Follow the diet recommended by your health care provider and drink enough fluid to keep your urine pale yellow. Get help right away if you have trouble breathing or new-onset confusion at home. This information is not intended to replace advice given to you by your health care provider. Make sure you discuss any questions you have with your health care provider. Document Revised: 11/03/2019 Document Reviewed: 06/01/2019 Elsevier Patient Education  2021 Elsevier Inc.     

## 2021-04-30 LAB — SURGICAL PATHOLOGY

## 2021-05-04 ENCOUNTER — Telehealth: Payer: Self-pay | Admitting: Hematology

## 2021-05-04 NOTE — Telephone Encounter (Signed)
I called his wife (could not get hold of him) to review his biopsy of the presacral lymph node, which showed reactive change, no malignancy.  Patient and his wife have seen the report, and are quite relieved.  He has not heard from Dr. Pollie Friar office about his ENT appointment, I will let my nurse follow-up tomorrow.  Plan to see him back next month as scheduled, and repeat CT scan in 4 to 6 months.  Truitt Merle  05/04/2021

## 2021-05-05 ENCOUNTER — Other Ambulatory Visit: Payer: Self-pay

## 2021-05-05 DIAGNOSIS — C20 Malignant neoplasm of rectum: Secondary | ICD-10-CM

## 2021-05-05 DIAGNOSIS — J392 Other diseases of pharynx: Secondary | ICD-10-CM

## 2021-05-05 NOTE — Progress Notes (Signed)
Referral faxed to [facility] [place] [phone].

## 2021-05-06 ENCOUNTER — Encounter: Payer: Self-pay | Admitting: Hematology

## 2021-05-08 ENCOUNTER — Encounter: Payer: Self-pay | Admitting: Hematology

## 2021-05-09 NOTE — Progress Notes (Signed)
I spoke with Stoy's wife [name].  They have not heard from [name].  She will call them to make the appt.  She will call if there is any issue.

## 2021-05-12 DIAGNOSIS — Z85048 Personal history of other malignant neoplasm of rectum, rectosigmoid junction, and anus: Secondary | ICD-10-CM | POA: Diagnosis not present

## 2021-05-15 ENCOUNTER — Other Ambulatory Visit: Payer: Self-pay

## 2021-05-15 ENCOUNTER — Ambulatory Visit (INDEPENDENT_AMBULATORY_CARE_PROVIDER_SITE_OTHER): Payer: BC Managed Care – PPO | Admitting: Psychologist

## 2021-05-15 DIAGNOSIS — F320 Major depressive disorder, single episode, mild: Secondary | ICD-10-CM

## 2021-05-15 DIAGNOSIS — F411 Generalized anxiety disorder: Secondary | ICD-10-CM | POA: Diagnosis not present

## 2021-05-19 ENCOUNTER — Other Ambulatory Visit (HOSPITAL_COMMUNITY): Payer: Self-pay

## 2021-05-20 DIAGNOSIS — Z0000 Encounter for general adult medical examination without abnormal findings: Secondary | ICD-10-CM | POA: Diagnosis not present

## 2021-05-20 DIAGNOSIS — E782 Mixed hyperlipidemia: Secondary | ICD-10-CM | POA: Diagnosis not present

## 2021-05-20 DIAGNOSIS — I10 Essential (primary) hypertension: Secondary | ICD-10-CM | POA: Diagnosis not present

## 2021-05-21 ENCOUNTER — Other Ambulatory Visit (HOSPITAL_COMMUNITY): Payer: Self-pay

## 2021-05-23 DIAGNOSIS — Z23 Encounter for immunization: Secondary | ICD-10-CM | POA: Diagnosis not present

## 2021-05-27 ENCOUNTER — Other Ambulatory Visit (HOSPITAL_COMMUNITY): Payer: Self-pay

## 2021-05-30 ENCOUNTER — Other Ambulatory Visit: Payer: Self-pay

## 2021-05-30 ENCOUNTER — Encounter: Payer: Self-pay | Admitting: Hematology

## 2021-05-30 ENCOUNTER — Inpatient Hospital Stay: Payer: BC Managed Care – PPO | Attending: Hematology | Admitting: Hematology

## 2021-05-30 ENCOUNTER — Inpatient Hospital Stay: Payer: BC Managed Care – PPO

## 2021-05-30 VITALS — BP 120/88 | HR 68 | Temp 98.0°F | Resp 17 | Ht 71.0 in | Wt 204.2 lb

## 2021-05-30 DIAGNOSIS — E785 Hyperlipidemia, unspecified: Secondary | ICD-10-CM | POA: Insufficient documentation

## 2021-05-30 DIAGNOSIS — D125 Benign neoplasm of sigmoid colon: Secondary | ICD-10-CM | POA: Diagnosis not present

## 2021-05-30 DIAGNOSIS — Z9221 Personal history of antineoplastic chemotherapy: Secondary | ICD-10-CM | POA: Insufficient documentation

## 2021-05-30 DIAGNOSIS — Z9049 Acquired absence of other specified parts of digestive tract: Secondary | ICD-10-CM | POA: Insufficient documentation

## 2021-05-30 DIAGNOSIS — Z79899 Other long term (current) drug therapy: Secondary | ICD-10-CM | POA: Diagnosis not present

## 2021-05-30 DIAGNOSIS — Z7289 Other problems related to lifestyle: Secondary | ICD-10-CM | POA: Insufficient documentation

## 2021-05-30 DIAGNOSIS — C20 Malignant neoplasm of rectum: Secondary | ICD-10-CM | POA: Diagnosis not present

## 2021-05-30 DIAGNOSIS — K8020 Calculus of gallbladder without cholecystitis without obstruction: Secondary | ICD-10-CM | POA: Insufficient documentation

## 2021-05-30 DIAGNOSIS — K5730 Diverticulosis of large intestine without perforation or abscess without bleeding: Secondary | ICD-10-CM | POA: Diagnosis not present

## 2021-05-30 DIAGNOSIS — F419 Anxiety disorder, unspecified: Secondary | ICD-10-CM | POA: Insufficient documentation

## 2021-05-30 DIAGNOSIS — I700 Atherosclerosis of aorta: Secondary | ICD-10-CM | POA: Diagnosis not present

## 2021-05-30 DIAGNOSIS — G4700 Insomnia, unspecified: Secondary | ICD-10-CM | POA: Insufficient documentation

## 2021-05-30 DIAGNOSIS — D124 Benign neoplasm of descending colon: Secondary | ICD-10-CM | POA: Diagnosis not present

## 2021-05-30 DIAGNOSIS — R161 Splenomegaly, not elsewhere classified: Secondary | ICD-10-CM | POA: Insufficient documentation

## 2021-05-30 DIAGNOSIS — K760 Fatty (change of) liver, not elsewhere classified: Secondary | ICD-10-CM | POA: Insufficient documentation

## 2021-05-30 DIAGNOSIS — I10 Essential (primary) hypertension: Secondary | ICD-10-CM | POA: Insufficient documentation

## 2021-05-30 DIAGNOSIS — D122 Benign neoplasm of ascending colon: Secondary | ICD-10-CM | POA: Diagnosis not present

## 2021-05-30 DIAGNOSIS — D123 Benign neoplasm of transverse colon: Secondary | ICD-10-CM | POA: Insufficient documentation

## 2021-05-30 DIAGNOSIS — Z21 Asymptomatic human immunodeficiency virus [HIV] infection status: Secondary | ICD-10-CM | POA: Diagnosis not present

## 2021-05-30 LAB — CBC WITH DIFFERENTIAL (CANCER CENTER ONLY)
Abs Immature Granulocytes: 0.02 10*3/uL (ref 0.00–0.07)
Basophils Absolute: 0 10*3/uL (ref 0.0–0.1)
Basophils Relative: 1 %
Eosinophils Absolute: 0.1 10*3/uL (ref 0.0–0.5)
Eosinophils Relative: 2 %
HCT: 35.7 % — ABNORMAL LOW (ref 39.0–52.0)
Hemoglobin: 13.3 g/dL (ref 13.0–17.0)
Immature Granulocytes: 1 %
Lymphocytes Relative: 33 %
Lymphs Abs: 1.3 10*3/uL (ref 0.7–4.0)
MCH: 35.3 pg — ABNORMAL HIGH (ref 26.0–34.0)
MCHC: 37.3 g/dL — ABNORMAL HIGH (ref 30.0–36.0)
MCV: 94.7 fL (ref 80.0–100.0)
Monocytes Absolute: 0.3 10*3/uL (ref 0.1–1.0)
Monocytes Relative: 9 %
Neutro Abs: 2 10*3/uL (ref 1.7–7.7)
Neutrophils Relative %: 54 %
Platelet Count: 172 10*3/uL (ref 150–400)
RBC: 3.77 MIL/uL — ABNORMAL LOW (ref 4.22–5.81)
RDW: 11.9 % (ref 11.5–15.5)
WBC Count: 3.7 10*3/uL — ABNORMAL LOW (ref 4.0–10.5)
nRBC: 0 % (ref 0.0–0.2)

## 2021-05-30 LAB — CMP (CANCER CENTER ONLY)
ALT: 35 U/L (ref 0–44)
AST: 31 U/L (ref 15–41)
Albumin: 4.1 g/dL (ref 3.5–5.0)
Alkaline Phosphatase: 91 U/L (ref 38–126)
Anion gap: 12 (ref 5–15)
BUN: 23 mg/dL — ABNORMAL HIGH (ref 6–20)
CO2: 22 mmol/L (ref 22–32)
Calcium: 9.2 mg/dL (ref 8.9–10.3)
Chloride: 107 mmol/L (ref 98–111)
Creatinine: 1.36 mg/dL — ABNORMAL HIGH (ref 0.61–1.24)
GFR, Estimated: >60 mL/min (ref >60)
Glucose, Bld: 87 mg/dL (ref 70–99)
Potassium: 4.3 mmol/L (ref 3.5–5.1)
Sodium: 141 mmol/L (ref 135–145)
Total Bilirubin: 0.6 mg/dL (ref 0.3–1.2)
Total Protein: 7 g/dL (ref 6.5–8.1)

## 2021-05-30 LAB — CEA (IN HOUSE-CHCC): CEA (CHCC-In House): 1.47 ng/mL (ref 0.00–5.00)

## 2021-05-30 NOTE — Progress Notes (Signed)
[place]   [phone] Fax:([phone]) [phone]   Clinic Follow up Note   Patient Care Team: Merwyn Katos as PCP - General (Physician Assistant) Michel Bickers, MD as Consulting Physician (Infectious Diseases) Leighton Ruff, MD as Consulting Physician (General Surgery) Truitt Merle, MD as Consulting Physician (Hematology) Kyung Rudd, MD as Consulting Physician (Radiation Oncology)  Date of Service:  05/30/2021  CHIEF COMPLAINT: f/u of rectal cancer  CURRENT THERAPY:  Surveillance  ASSESSMENT & PLAN:  Joshua Zhang is a 50 y.o. male with   1. Low rectal adenocarcinoma, cT3N1M0, Stage IIIB, MMR normal, ypT0N0 -This was discovered on screening colonoscopy in 06/2019 with low rectum mass close to sphincter. Biopsy showed this is moderately-differentiated adenocarcinoma locally advanced. Based on MRI, this is cT3N1 stage III disease.  -He completed 8 cycles of total neoadjuvant chemotherapy with FOLFOX and concurrent chemoRT with Xeloda.  -He underwent surgery with Dr Marcello Moores on 04/04/11.  Overall he had complete response to chemo/RT. I do not plan to give adjuvant treatment. He is currently on surveillance. -He underwent ileostomy takedown on 06/12/20.  He developed a hernia and underwent surgery for this 09/26/20 -we had been following a node in the presacral space. He underwent PET scan on 03/21/21, which showed the node to be minimally more conspicuous. Likely physiologic FDG-uptake was noted in the anal region, no metastatic disease. Biopsy of the node on 04/29/21 was benign. I reviewed the above with him again today  -he last saw Dr. Marcello Moores 05/12/21. She will see him back in 6 months. -we will plan for repeat CT in 2 months for close f/u. If this is clear, we will repeat CT 6 months after.   2. Anxiety -related to cancer diagnosis and concern for recurrence -caused insomnia and increased alcohol use. -I prescribed Zoloft on 02/28/21. He has  established care with Dr. Michail Sermon.   3. HIV, controlled  -He denies h/o or current use of recreational or IV drugs. He is unsure how he initially got infected.  -Controlled and undetectable. 09/15/18 CD4 was 350. His 10/09/19 CD4 T cells is 708  -He is on Biktavy. This is managed by Dr. Megan Salon.   -He has received both his COVID19 vaccines in 11/2019. I encouraged him to receive COVID booster    4. Comorbidities: HLD, HTN -He will continue medications and f/u with PCP      PLAN:  -f/u in 2 months with lab and CT AP several days before.   No problem-specific Assessment & Plan notes found for this encounter.   SUMMARY OF ONCOLOGIC HISTORY: Oncology History Overview Note  Cancer Staging Rectal adenocarcinoma [facility]) Staging form: Colon and Rectum, AJCC 8th Edition - Clinical stage from 08/21/2019: Stage IIIB (cT3, cN1, cM0) - Signed by Truitt Merle, MD on 08/21/2019 Stage prefix: Initial diagnosis Histologic grade (G): G2 Histologic grading system: 4 grade system    Rectal adenocarcinoma ([place])  07/03/2019 Procedure   Colonoscopy by Dr. Collene Mares  IMPRESSION -Likely Malignant 5cm, circumferential, bleeding tumor in the rectum, 304 cm from the anal verge -biopsy done  -One 8 mm sessile polyp in the distal sigmoid colon, removed with a hot snare x2; resected and retrieved.  -Two small sessile polyps, 1 in the mid-descending colon and 1 in the mid transverse colon - removed by cold biopsies.  -One 68m sessile polyp in the mid transverse colon, removed with a hot snare x1; resected and retrieved.  -One 855msessile polyp in the proximal ascending colon, removed  with a hot snare X1; resected and retrieved.  -few large scattered diverticula.   07/03/2019 Initial Biopsy   FINAL DIAGNOSIS 07/03/19  A. Colon, Descending, Polyp, Polypectomy:   -TUBULAR ADENOMA   -No high grade dysplasia or malignancy.  B. Colon, transverse, polyp, polypectomy:   -TUBULAR ADENOMA  -No high grade dysplasia or  malignancy. C. Colon, Ascending, Polyp, Polypectomy:   -Fragments of sessile serrated adenoma/polyp D. Colon, sigmoid, polyp, polypectomy:   -Fragments of Tubular Adenoma  -No high grade dysplasia or malignancy. E. Rectum, Mass, Biopsy:   -INVASIVE COLONIC ADENOCARCINOMA, MODERATELY-DIFFERENTIATED, see comment     07/12/2019 Imaging   CT AP W Contrast 07/12/19  IMPRESSION: 1. No evidence of metastatic disease within the chest, abdomen or pelvis. 2. Possible mild wall thickening in the sigmoid colon, although no focal colonic mass is identified. There are few distal colonic diverticula. 3. Sequela of prior granulomatous disease. 4. Possible cholelithiasis with mild wall thickening of the gallbladder fundus. 5. Aortic Atherosclerosis (ICD10-I70.0).   08/21/2019 Initial Diagnosis   Rectal adenocarcinoma ([place])   08/21/2019 Cancer Staging   Staging form: Colon and Rectum, AJCC 8th Edition - Clinical stage from 08/21/2019: Stage IIIB (cT3, cN1, cM0) - Signed by Truitt Merle, MD on 08/21/2019    08/31/2019 - 12/07/2019 Chemotherapy   FOLFOX q2weeks starting 08/31/19. Removed 5FU bolus and increased steroids pre-meds due to thrombocytopenia and skin rash starting with C4. Completed 12/07/19    11/21/2019 Imaging   CT AP W contrast  IMPRESSION: 1. Stable exam. No evidence for metastatic disease within the abdomen or pelvis. 2. Splenomegaly.  New from previous exam. 3. Gallstones.   Aortic Atherosclerosis (ICD10-I70.0).   12/25/2019 - 02/01/2020 Chemotherapy   Concurrent chemoRT with Xeloda 2096m in the AM and 15056min the PM on days of RT  12/25/19-7/15/2   12/25/2019 - 02/01/2020 Radiation Therapy   Concurrent  chemoRT with Dr MoLisbeth Renshaw/7/21-7/15/21   04/03/2020 Surgery   XI ROBOTIC ASSISTED LOWER ANTERIOR RESECTION, SPLENIC FLEXURE MOBILIZATION, RIGID PROCTOSCOPY and DIVERTING LOOP ILEOSTOMY with Dr ThMalcolm Metrond Dr GrJohney Maine 04/03/2020 Pathology Results   FINAL MICROSCOPIC DIAGNOSIS:   A.  RECTOSIGMOID COLON, LOW ANTERIOR RESECTION:  - No residual invasive carcinoma status post neoadjuvant treatment.  - See oncology table.   B. FINAL DISTAL MARGIN, EXCISION:  - Unremarkable colonic mucosa.    06/12/2020 Surgery   ILEOSTOMY REVERSAL and PAC removal by Dr ThMarcello Moores  11/26/2020 Imaging   CT C/A/P  IMPRESSION: 1. Changes of prior low anterior resection. Soft tissue nodularity in the presacral space/mesorectal fat, which are nonspecific and may reflect post treatment/surgical changes but disease involvement not excluded. Attention on close interval follow-up exams is recommended. 2. No evidence of metastatic disease within the chest. 3. Similar splenomegaly. 4. Cholelithiasis without evidence of acute cholecystitis. 5. Aortic atherosclerosis.   02/26/2021 Imaging   IMPRESSION: 1. Status post low anterior resection. Nodularity in the presacral space is stable to minimally increased in the interval. Continued close attention recommended as recurrent disease not excluded. 2. Cholelithiasis. 3. Aortic Atherosclerosis (ICD10-I70.0).   03/21/2021 PET scan   IMPRESSION: 1. Small presacral nodule which appears minimally more conspicuous since May 2022, measuring 9 mm today compared to 6 mm than shows low level hypermetabolism. Continued close attention on follow-up recommended as recurrent/metastatic disease is a concern. 2. Hypermetabolic FDG uptake in the anal region likely physiologic. Attention on follow-up recommended. 3. No evidence for distant metastases in the chest, abdomen, or pelvis. 4. Hepatic  steatosis. 5. Cholelithiasis. 6.  Aortic Atherosclerois (ICD10-170.0)   04/29/2021 Pathology Results   FINAL MICROSCOPIC DIAGNOSIS:   A. LYMPH NODE, PRE-SACRAL, BIOPSY:  - Reactive lymphoid hyperplasia  - No metastatic carcinoma identified       INTERVAL HISTORY:  Joshua Zhang is here for a follow up of rectal cancer. He was last seen by me on 02/28/21. He  presents to the clinic alone. He reports he is doing well overall, no new concerns. He notes he is working on his diet to improve his health. His weight is overall about 10 lbs from his last visit. He notes his goal weight is 180 lbs.   All other systems were reviewed with the patient and are negative.  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    Port-A-Cath in place 10/12/2019   rectal ca dx'd 06/2019   Renal insufficiency     SURGICAL HISTORY: Past Surgical History:  Procedure Laterality Date   APPENDECTOMY     50 years old    DIVERTING ILEOSTOMY N/A 04/03/2020   Procedure: DIVERTING LOOP ILEOSTOMY;  Surgeon: Leighton Ruff, MD;  Location: [facility];  Service: General;  Laterality: N/A;   FINGER SURGERY Right    ILEOSTOMY CLOSURE N/A 06/12/2020   Procedure: ILEOSTOMY REVERSAL;  Surgeon: Leighton Ruff, MD;  Location: [facility];  Service: General;  Laterality: N/A;   [place] N/A 09/26/2020   Procedure: LAPAROSCOPIC INCISIONAL HERNIA REPAIR WITH MESH;  Surgeon: Leighton Ruff, MD;  Location: [facility];  Service: General;  Laterality: N/A;   IR IMAGING GUIDED PORT INSERTION  08/30/2019   PORT-A-CATH REMOVAL Right 06/12/2020   Procedure: REMOVAL PORT-A-CATH;  Surgeon: Leighton Ruff, MD;  Location: [facility];  Service: General;  Laterality: Right;   SHOULDER SURGERY Bilateral 50 years old   [place] N/A 04/03/2020   Procedure: XI ROBOTIC ASSISTED LOWER ANTERIOR RESECTION, SPLENIC FLEXURE MOBILIZATION, RIGID PROCTOSCOPY;  Surgeon: Leighton Ruff, MD;  Location: [facility];  Service: General;  Laterality: N/A;    I have reviewed the social history and family history with the patient and they are unchanged from previous note.  ALLERGIES:  has No Known  Allergies.  MEDICATIONS:  Current Outpatient Medications  Medication Sig Dispense Refill   amLODipine (NORVASC) 5 MG tablet Take 5 mg by mouth daily.      atorvastatin (LIPITOR) 10 MG tablet Take 10 mg by mouth daily.      bictegravir-emtricitabine-tenofovir AF (BIKTARVY) 50-200-25 MG TABS tablet TAKE 1 TABLET BY MOUTH DAILY. 30 tablet 11   losartan (COZAAR) 25 MG tablet Take 25 mg by mouth daily.      Multiple Vitamin (MULTIVITAMIN) tablet Take 1 tablet by mouth daily.     oxyCODONE (OXY IR/ROXICODONE) 5 MG immediate release tablet Take 1-2 tablets (5-10 mg total) by mouth every 6 (six) hours as needed. (Patient not taking: Reported on 01/08/2021) 30 tablet 0   sertraline (ZOLOFT) 50 MG tablet Take 1 tablet (50 mg total) by mouth daily. 30 tablet 1   No current facility-administered medications for this visit.    PHYSICAL EXAMINATION: ECOG PERFORMANCE STATUS: 0 - Asymptomatic  Vitals:   05/30/21 1221  BP: 120/88  Pulse: 68  Resp: 17  Temp: 98 F (36.7 C)  SpO2: 100%   Wt Readings from Last 3 Encounters:  05/30/21 204 lb 3.2 oz (92.6 kg)  03/21/21 200 lb (90.7 kg)  02/28/21 212 lb 4.8 oz (96.3 kg)     GENERAL:alert, no distress and comfortable SKIN: skin color normal, no rashes or significant lesions EYES: normal, Conjunctiva are pink and non-injected, sclera clear  NEURO: alert & oriented x 3 with fluent speech  LABORATORY DATA:  I have reviewed the data as listed CBC Latest Ref Rng & Units 05/30/2021 04/29/2021 02/26/2021  WBC 4.0 - 10.5 K/uL 3.7(L) 3.7(L) 5.0  Hemoglobin 13.0 - 17.0 g/dL 13.3 14.5 15.0  Hematocrit 39.0 - 52.0 % 35.7(L) 39.7 40.6  Platelets 150 - 400 K/uL 172 148(L) 184     CMP Latest Ref Rng & Units 05/30/2021 02/26/2021 12/24/2020  Glucose 70 - 99 mg/dL 87 106(H) 104(H)  BUN 6 - 20 mg/dL 23(H) 11 14  Creatinine 0.61 - 1.24 mg/dL 1.36(H) 1.00 0.92  Sodium 135 - 145 mmol/L 141 140 139  Potassium 3.5 - 5.1 mmol/L 4.3 4.0 3.9  Chloride 98 - 111  mmol/L 107 105 105  CO2 22 - 32 mmol/L _0 Calcium 8.9 - 10.3 mg/dL 9.2 9.8 9.0  Total Protein 6.5 - 8.1 g/dL 7.0 7.2 6.2  Total Bilirubin 0.3 - 1.2 mg/dL 0.6 1.7(H) 0.9  Alkaline Phos 38 - 126 U/L 91 102 -  AST 15 - 41 U/L 31 40 33  ALT 0 - 44 U/L 35 48(H) 36      RADIOGRAPHIC STUDIES: I have personally reviewed the radiological images as listed and agreed with the findings in the report. No results found.    Orders Placed This Encounter  Procedures   CT ABDOMEN PELVIS W CONTRAST    Standing Status:   Future    Standing Expiration Date:   05/30/2022    Order Specific Question:   If indicated for the ordered procedure, I authorize the administration of contrast media per Radiology protocol    Answer:   Yes    Order Specific Question:   Preferred imaging location?    Answer:   [facility]    Order Specific Question:   Release to patient    Answer:   Immediate    Order Specific Question:   Is Oral Contrast requested for this exam?    Answer:   Yes, Per Radiology protocol    All questions were answered. The patient knows to call the clinic with any problems, questions or concerns. No barriers to learning was detected.      Truitt Merle, MD 05/30/2021   I, Wilburn Mylar, am acting as scribe for Truitt Merle, MD.   I have reviewed the above documentation for accuracy and completeness, and I agree with the above.

## 2021-06-01 ENCOUNTER — Encounter: Payer: Self-pay | Admitting: Hematology

## 2021-06-10 ENCOUNTER — Other Ambulatory Visit: Payer: Self-pay | Admitting: Hematology

## 2021-06-10 MED ORDER — SERTRALINE HCL 50 MG PO TABS: 50.0000 mg | ORAL_TABLET | Freq: Every day | ORAL | 2 refills | Status: DC

## 2021-06-24 ENCOUNTER — Other Ambulatory Visit (HOSPITAL_COMMUNITY): Payer: Self-pay

## 2021-06-25 ENCOUNTER — Other Ambulatory Visit (HOSPITAL_COMMUNITY): Payer: Self-pay

## 2021-07-17 ENCOUNTER — Other Ambulatory Visit (HOSPITAL_COMMUNITY): Payer: Self-pay

## 2021-07-18 ENCOUNTER — Other Ambulatory Visit (HOSPITAL_COMMUNITY): Payer: Self-pay

## 2021-07-24 ENCOUNTER — Other Ambulatory Visit (HOSPITAL_COMMUNITY): Payer: Self-pay

## 2021-07-28 ENCOUNTER — Inpatient Hospital Stay: Payer: BC Managed Care – PPO | Attending: Hematology

## 2021-07-28 ENCOUNTER — Other Ambulatory Visit: Payer: Self-pay

## 2021-07-28 ENCOUNTER — Encounter: Payer: Self-pay | Admitting: Hematology

## 2021-07-28 DIAGNOSIS — C20 Malignant neoplasm of rectum: Secondary | ICD-10-CM | POA: Insufficient documentation

## 2021-07-28 LAB — CMP (CANCER CENTER ONLY)
ALT: 25 U/L (ref 0–44)
AST: 26 U/L (ref 15–41)
Albumin: 4.6 g/dL (ref 3.5–5.0)
Alkaline Phosphatase: 84 U/L (ref 38–126)
Anion gap: 10 (ref 5–15)
BUN: 18 mg/dL (ref 6–20)
CO2: 24 mmol/L (ref 22–32)
Calcium: 9.7 mg/dL (ref 8.9–10.3)
Chloride: 105 mmol/L (ref 98–111)
Creatinine: 0.98 mg/dL (ref 0.61–1.24)
GFR, Estimated: >60 mL/min (ref >60)
Glucose, Bld: 104 mg/dL — ABNORMAL HIGH (ref 70–99)
Potassium: 3.9 mmol/L (ref 3.5–5.1)
Sodium: 139 mmol/L (ref 135–145)
Total Bilirubin: 1.1 mg/dL (ref 0.3–1.2)
Total Protein: 7.6 g/dL (ref 6.5–8.1)

## 2021-07-28 LAB — CBC WITH DIFFERENTIAL (CANCER CENTER ONLY)
Abs Immature Granulocytes: 0.02 10*3/uL (ref 0.00–0.07)
Basophils Absolute: 0 10*3/uL (ref 0.0–0.1)
Basophils Relative: 1 %
Eosinophils Absolute: 0.1 10*3/uL (ref 0.0–0.5)
Eosinophils Relative: 1 %
HCT: 40.3 % (ref 39.0–52.0)
Hemoglobin: 15.1 g/dL (ref 13.0–17.0)
Immature Granulocytes: 0 %
Lymphocytes Relative: 19 %
Lymphs Abs: 0.9 10*3/uL (ref 0.7–4.0)
MCH: 35.1 pg — ABNORMAL HIGH (ref 26.0–34.0)
MCHC: 37.5 g/dL — ABNORMAL HIGH (ref 30.0–36.0)
MCV: 93.7 fL (ref 80.0–100.0)
Monocytes Absolute: 0.4 10*3/uL (ref 0.1–1.0)
Monocytes Relative: 8 %
Neutro Abs: 3.3 10*3/uL (ref 1.7–7.7)
Neutrophils Relative %: 71 %
Platelet Count: 208 10*3/uL (ref 150–400)
RBC: 4.3 MIL/uL (ref 4.22–5.81)
RDW: 11.9 % (ref 11.5–15.5)
WBC Count: 4.6 10*3/uL (ref 4.0–10.5)
nRBC: 0 % (ref 0.0–0.2)

## 2021-07-28 LAB — CEA (IN HOUSE-CHCC): CEA (CHCC-In House): 1.78 ng/mL (ref 0.00–5.00)

## 2021-07-29 ENCOUNTER — Telehealth: Payer: Self-pay

## 2021-07-29 NOTE — Telephone Encounter (Signed)
-----   Message from [place] sent at 07/29/2021 12:54 PM EST ----- CT Approved ----- Message ----- From: Alla Feeling, NP Sent: 07/29/2021  10:30 AM EST To: Truitt Merle, MD, [facility] Mo Pod 1, #  This patient needs a CT AP this week, it was ordered back in November but not authorized yet. Please do so, so we can get that done asap. He had labs yesterday so those are current. Please r/s f/up for 1-2 days after CT.  Thanks, Regan Rakers

## 2021-07-29 NOTE — Telephone Encounter (Signed)
Pt has been scheduled for CT and follow up with Dr.Feng. Pt was advised through MyChart to pick up contrast and take as directed.

## 2021-07-29 NOTE — Telephone Encounter (Signed)
Per Cira Rue NP, made pt aware to call centralized scheduling to schedule approved CT scan. Number was given to pt and advised to call or send a Mychart message with date of CT so follow up with Dr.Feng or Cira Rue NP can be scheduled. Pt verbalized understanding.

## 2021-07-31 ENCOUNTER — Ambulatory Visit: Payer: BC Managed Care – PPO | Admitting: Hematology

## 2021-08-11 DIAGNOSIS — F411 Generalized anxiety disorder: Secondary | ICD-10-CM | POA: Diagnosis not present

## 2021-08-11 DIAGNOSIS — F329 Major depressive disorder, single episode, unspecified: Secondary | ICD-10-CM | POA: Diagnosis not present

## 2021-08-19 ENCOUNTER — Other Ambulatory Visit (HOSPITAL_COMMUNITY): Payer: Self-pay

## 2021-08-19 ENCOUNTER — Encounter (HOSPITAL_COMMUNITY): Payer: Self-pay

## 2021-08-19 ENCOUNTER — Ambulatory Visit (HOSPITAL_COMMUNITY)
Admission: RE | Admit: 2021-08-19 | Discharge: 2021-08-19 | Disposition: A | Discharge status: Home / Self Care | Payer: BC Managed Care – PPO | Source: Ambulatory Visit | Attending: Hematology | Admitting: Hematology

## 2021-08-19 ENCOUNTER — Other Ambulatory Visit: Payer: Self-pay

## 2021-08-19 ENCOUNTER — Ambulatory Visit (HOSPITAL_COMMUNITY): Admission: RE | Admit: 2021-08-19 | Payer: BC Managed Care – PPO | Source: Ambulatory Visit

## 2021-08-19 DIAGNOSIS — C20 Malignant neoplasm of rectum: Secondary | ICD-10-CM | POA: Diagnosis not present

## 2021-08-19 DIAGNOSIS — I700 Atherosclerosis of aorta: Secondary | ICD-10-CM | POA: Diagnosis not present

## 2021-08-19 MED ORDER — SODIUM CHLORIDE (PF) 0.9 % IJ SOLN
INTRAMUSCULAR | Status: AC
  Filled 2021-08-19: qty 50

## 2021-08-19 MED ORDER — IOHEXOL 300 MG/ML  SOLN
100.0000 mL | Freq: Once | INTRAMUSCULAR | Status: AC | PRN
  Administered 2021-08-19: 100 mL via INTRAVENOUS

## 2021-08-21 ENCOUNTER — Encounter: Payer: Self-pay | Admitting: Hematology

## 2021-08-21 ENCOUNTER — Other Ambulatory Visit: Payer: Self-pay

## 2021-08-21 ENCOUNTER — Inpatient Hospital Stay: Payer: BC Managed Care – PPO | Attending: Hematology | Admitting: Hematology

## 2021-08-21 VITALS — BP 141/90 | HR 64 | Temp 98.2°F | Resp 18 | Ht 71.0 in | Wt 207.1 lb

## 2021-08-21 DIAGNOSIS — I10 Essential (primary) hypertension: Secondary | ICD-10-CM | POA: Diagnosis not present

## 2021-08-21 DIAGNOSIS — K760 Fatty (change of) liver, not elsewhere classified: Secondary | ICD-10-CM | POA: Diagnosis not present

## 2021-08-21 DIAGNOSIS — D124 Benign neoplasm of descending colon: Secondary | ICD-10-CM | POA: Diagnosis not present

## 2021-08-21 DIAGNOSIS — D122 Benign neoplasm of ascending colon: Secondary | ICD-10-CM | POA: Insufficient documentation

## 2021-08-21 DIAGNOSIS — Z79899 Other long term (current) drug therapy: Secondary | ICD-10-CM | POA: Insufficient documentation

## 2021-08-21 DIAGNOSIS — B20 Human immunodeficiency virus [HIV] disease: Secondary | ICD-10-CM

## 2021-08-21 DIAGNOSIS — Z21 Asymptomatic human immunodeficiency virus [HIV] infection status: Secondary | ICD-10-CM | POA: Insufficient documentation

## 2021-08-21 DIAGNOSIS — E785 Hyperlipidemia, unspecified: Secondary | ICD-10-CM | POA: Diagnosis not present

## 2021-08-21 DIAGNOSIS — F419 Anxiety disorder, unspecified: Secondary | ICD-10-CM | POA: Insufficient documentation

## 2021-08-21 DIAGNOSIS — I700 Atherosclerosis of aorta: Secondary | ICD-10-CM | POA: Diagnosis not present

## 2021-08-21 DIAGNOSIS — K8020 Calculus of gallbladder without cholecystitis without obstruction: Secondary | ICD-10-CM | POA: Diagnosis not present

## 2021-08-21 DIAGNOSIS — G4700 Insomnia, unspecified: Secondary | ICD-10-CM | POA: Diagnosis not present

## 2021-08-21 DIAGNOSIS — Z9049 Acquired absence of other specified parts of digestive tract: Secondary | ICD-10-CM | POA: Insufficient documentation

## 2021-08-21 DIAGNOSIS — Z9221 Personal history of antineoplastic chemotherapy: Secondary | ICD-10-CM | POA: Insufficient documentation

## 2021-08-21 DIAGNOSIS — C20 Malignant neoplasm of rectum: Secondary | ICD-10-CM | POA: Diagnosis not present

## 2021-08-21 DIAGNOSIS — D125 Benign neoplasm of sigmoid colon: Secondary | ICD-10-CM | POA: Diagnosis not present

## 2021-08-21 DIAGNOSIS — D123 Benign neoplasm of transverse colon: Secondary | ICD-10-CM | POA: Insufficient documentation

## 2021-08-21 NOTE — Progress Notes (Signed)
[facility]   [phone] [phone]   Clinic Follow up Note   Patient Care Team: Merwyn Katos as PCP - General (Physician Assistant) Michel Bickers, MD as Consulting Physician (Infectious Diseases) Leighton Ruff, MD as Consulting Physician (General Surgery) Truitt Merle, MD as Consulting Physician (Hematology) Kyung Rudd, MD as Consulting Physician (Radiation Oncology)  Date of Service:  08/21/2021  CHIEF COMPLAINT: f/u of rectal cancer  CURRENT THERAPY:  Surveillance  ASSESSMENT & PLAN:  Joshua Zhang is a 51 y.o. male with   1. Low rectal adenocarcinoma, cT3N1M0, Stage IIIB, MMR normal, ypT0N0 -discovered on screening colonoscopy in 06/2019 with low rectum mass close to sphincter. Biopsy showed locally advanced moderately differentiated adenocarcinoma. Based on MRI, this is cT3N1 stage III disease.  -He completed 8 cycles of total neoadjuvant chemotherapy with FOLFOX and concurrent chemoRT with Xeloda.  -He underwent surgery with Dr Marcello Moores on 04/03/20.  Overall he had complete response to chemo/RT. He is currently on surveillance. -He underwent ileostomy takedown on 06/12/20.  He developed a hernia and underwent surgery for this 09/26/20 -most recent CT AP on 08/19/21 showed NED and decrease in size of presacral soft tissue. I personally reviewed the images and discussed the results with him today.  -We will scan him one final time in a year. We will continue in-office f/u every 4 months for the next year then every 6 months    2. Anxiety -related to cancer diagnosis and concern for recurrence -caused insomnia and increased alcohol use. -I prescribed Zoloft on 02/28/21; he is now on a lower dose. He has established care with Dr. Michail Sermon.   3. HIV, controlled  -He denies h/o or current use of recreational or IV drugs. He is unsure how he initially got infected.  -Controlled and undetectable. 09/15/18 CD4 was 350. His 10/09/19 CD4  T cells is 708  -He is on Biktavy. This is managed by Dr. Megan Salon.   -He has received both his COVID19 vaccines in 11/2019. I encouraged him to receive COVID booster    4. Comorbidities: HLD, HTN -He will continue medications and f/u with PCP      PLAN:  -lab and CT scan reviewed, NED -lab and f/u in 4 months   No problem-specific Assessment & Plan notes found for this encounter.   SUMMARY OF ONCOLOGIC HISTORY: Oncology History Overview Note  Cancer Staging Rectal adenocarcinoma [facility]) Staging form: Colon and Rectum, AJCC 8th Edition - Clinical stage from 08/21/2019: Stage IIIB (cT3, cN1, cM0) - Signed by Truitt Merle, MD on 08/21/2019 Stage prefix: Initial diagnosis Histologic grade (G): G2 Histologic grading system: 4 grade system    Rectal adenocarcinoma ([place])  07/03/2019 Procedure   Colonoscopy by Dr. Collene Mares  IMPRESSION -Likely Malignant 5cm, circumferential, bleeding tumor in the rectum, 304 cm from the anal verge -biopsy done  -One 8 mm sessile polyp in the distal sigmoid colon, removed with a hot snare x2; resected and retrieved.  -Two small sessile polyps, 1 in the mid-descending colon and 1 in the mid transverse colon - removed by cold biopsies.  -One 76mm sessile polyp in the mid transverse colon, removed with a hot snare x1; resected and retrieved.  -One 51mm sessile polyp in the proximal ascending colon, removed with a hot snare X1; resected and retrieved.  -few large scattered diverticula.   07/03/2019 Initial Biopsy   FINAL DIAGNOSIS 07/03/19  A. Colon, Descending, Polyp, Polypectomy:   -TUBULAR ADENOMA   -No high grade dysplasia  or malignancy.  B. Colon, transverse, polyp, polypectomy:   -TUBULAR ADENOMA  -No high grade dysplasia or malignancy. C. Colon, Ascending, Polyp, Polypectomy:   -Fragments of sessile serrated adenoma/polyp D. Colon, sigmoid, polyp, polypectomy:   -Fragments of Tubular Adenoma  -No high grade dysplasia or malignancy. E. Rectum, Mass,  Biopsy:   -INVASIVE COLONIC ADENOCARCINOMA, MODERATELY-DIFFERENTIATED, see comment     07/12/2019 Imaging   CT AP W Contrast 07/12/19  IMPRESSION: 1. No evidence of metastatic disease within the chest, abdomen or pelvis. 2. Possible mild wall thickening in the sigmoid colon, although no focal colonic mass is identified. There are few distal colonic diverticula. 3. Sequela of prior granulomatous disease. 4. Possible cholelithiasis with mild wall thickening of the gallbladder fundus. 5. Aortic Atherosclerosis (ICD10-I70.0).   08/21/2019 Initial Diagnosis   Rectal adenocarcinoma ([place])   08/21/2019 Cancer Staging   Staging form: Colon and Rectum, AJCC 8th Edition - Clinical stage from 08/21/2019: Stage IIIB (cT3, cN1, cM0) - Signed by Truitt Merle, MD on 08/21/2019    08/31/2019 - 12/07/2019 Chemotherapy   FOLFOX q2weeks starting 08/31/19. Removed 5FU bolus and increased steroids pre-meds due to thrombocytopenia and skin rash starting with C4. Completed 12/07/19    11/21/2019 Imaging   CT AP W contrast  IMPRESSION: 1. Stable exam. No evidence for metastatic disease within the abdomen or pelvis. 2. Splenomegaly.  New from previous exam. 3. Gallstones.   Aortic Atherosclerosis (ICD10-I70.0).   12/25/2019 - 02/01/2020 Chemotherapy   Concurrent chemoRT with Xeloda $RemoveBef'2000mg'etOesNUkqI$  in the AM and $Remo'1500mg'CHYcd$  in the PM on days of RT  12/25/19-7/15/2   12/25/2019 - 02/01/2020 Radiation Therapy   Concurrent  chemoRT with Dr Lisbeth Renshaw 12/25/19-02/01/20   04/03/2020 Surgery   XI ROBOTIC ASSISTED LOWER ANTERIOR RESECTION, SPLENIC FLEXURE MOBILIZATION, RIGID PROCTOSCOPY and DIVERTING LOOP ILEOSTOMY with Dr Malcolm Metro and Dr Johney Maine   04/03/2020 Pathology Results   FINAL MICROSCOPIC DIAGNOSIS:   A. RECTOSIGMOID COLON, LOW ANTERIOR RESECTION:  - No residual invasive carcinoma status post neoadjuvant treatment.  - See oncology table.   B. FINAL DISTAL MARGIN, EXCISION:  - Unremarkable colonic mucosa.    06/12/2020 Surgery    ILEOSTOMY REVERSAL and PAC removal by Dr Marcello Moores    11/26/2020 Imaging   CT C/A/P  IMPRESSION: 1. Changes of prior low anterior resection. Soft tissue nodularity in the presacral space/mesorectal fat, which are nonspecific and may reflect post treatment/surgical changes but disease involvement not excluded. Attention on close interval follow-up exams is recommended. 2. No evidence of metastatic disease within the chest. 3. Similar splenomegaly. 4. Cholelithiasis without evidence of acute cholecystitis. 5. Aortic atherosclerosis.   02/26/2021 Imaging   IMPRESSION: 1. Status post low anterior resection. Nodularity in the presacral space is stable to minimally increased in the interval. Continued close attention recommended as recurrent disease not excluded. 2. Cholelithiasis. 3. Aortic Atherosclerosis (ICD10-I70.0).   03/21/2021 PET scan   IMPRESSION: 1. Small presacral nodule which appears minimally more conspicuous since May 2022, measuring 9 mm today compared to 6 mm than shows low level hypermetabolism. Continued close attention on follow-up recommended as recurrent/metastatic disease is a concern. 2. Hypermetabolic FDG uptake in the anal region likely physiologic. Attention on follow-up recommended. 3. No evidence for distant metastases in the chest, abdomen, or pelvis. 4. Hepatic steatosis. 5. Cholelithiasis. 6.  Aortic Atherosclerois (ICD10-170.0)   04/29/2021 Pathology Results   FINAL MICROSCOPIC DIAGNOSIS:   A. LYMPH NODE, PRE-SACRAL, BIOPSY:  - Reactive lymphoid hyperplasia  - No metastatic carcinoma identified  08/19/2021 Imaging   EXAM: CT ABDOMEN AND PELVIS WITH CONTRAST  IMPRESSION: 1. No new suspicious mass or lymphadenopathy identified in the abdomen or pelvis. 2. Postsurgical changes of the lower rectum. Decreased size of presacral nodular densities measuring up to 6 mm in short axis. 3. Colonic diverticulosis. 4. Other ancillary findings as described.       INTERVAL HISTORY:  Joshua Zhang is here for a follow up of rectal cancer. He was last seen by me on 05/30/21. He presents to the clinic alone. He reports he is feeling better-- his bowel movements are improving, and he no longer requires the imodium, his energy is back to normal, and he no longer has cramping.   All other systems were reviewed with the patient and are negative.  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    Port-A-Cath in place 10/12/2019   rectal ca dx'd 06/2019   Renal insufficiency     SURGICAL HISTORY: Past Surgical History:  Procedure Laterality Date   APPENDECTOMY     51 years old    DIVERTING ILEOSTOMY N/A 04/03/2020   Procedure: DIVERTING LOOP ILEOSTOMY;  Surgeon: Leighton Ruff, MD;  Location: [facility];  Service: General;  Laterality: N/A;   FINGER SURGERY Right    ILEOSTOMY CLOSURE N/A 06/12/2020   Procedure: ILEOSTOMY REVERSAL;  Surgeon: Leighton Ruff, MD;  Location: [facility];  Service: General;  Laterality: N/A;   [place] N/A 09/26/2020   Procedure: LAPAROSCOPIC INCISIONAL HERNIA REPAIR WITH MESH;  Surgeon: Leighton Ruff, MD;  Location: [facility];  Service: General;  Laterality: N/A;   IR IMAGING GUIDED PORT INSERTION  08/30/2019   PORT-A-CATH REMOVAL Right 06/12/2020   Procedure: REMOVAL PORT-A-CATH;  Surgeon: Leighton Ruff, MD;  Location: [facility];  Service: General;  Laterality: Right;   SHOULDER SURGERY Bilateral 51 years old   [place] N/A 04/03/2020   Procedure: XI ROBOTIC ASSISTED LOWER ANTERIOR RESECTION, SPLENIC FLEXURE MOBILIZATION, RIGID PROCTOSCOPY;  Surgeon: Leighton Ruff, MD;  Location: [facility];  Service: General;  Laterality: N/A;    I have reviewed the social history and family history with the  patient and they are unchanged from previous note.  ALLERGIES:  has No Known Allergies.  MEDICATIONS:  Current Outpatient Medications  Medication Sig Dispense Refill   amLODipine (NORVASC) 5 MG tablet Take 5 mg by mouth daily.      atorvastatin (LIPITOR) 10 MG tablet Take 10 mg by mouth daily.      bictegravir-emtricitabine-tenofovir AF (BIKTARVY) 50-200-25 MG TABS tablet TAKE 1 TABLET BY MOUTH DAILY. 30 tablet 11   losartan (COZAAR) 25 MG tablet Take 25 mg by mouth daily.      Multiple Vitamin (MULTIVITAMIN) tablet Take 1 tablet by mouth daily.     sertraline (ZOLOFT) 50 MG tablet Take 1 tablet (50 mg total) by mouth daily. 30 tablet 2   No current facility-administered medications for this visit.    PHYSICAL EXAMINATION: ECOG PERFORMANCE STATUS: 0 - Asymptomatic  Vitals:   08/21/21 1152  BP: (!) 141/90  Pulse: 64  Resp: 18  Temp: 98.2 F (36.8 C)  SpO2: 100%   Wt Readings from Last 3 Encounters:  08/21/21 207 lb 1.6 oz (93.9 kg)  05/30/21 204 lb 3.2  oz (92.6 kg)  03/21/21 200 lb (90.7 kg)     GENERAL:alert, no distress and comfortable SKIN: skin color normal, no rashes or significant lesions EYES: normal, Conjunctiva are pink and non-injected, sclera clear  NEURO: alert & oriented x 3 with fluent speech Exam deferred given recent scan.  LABORATORY DATA:  I have reviewed the data as listed CBC Latest Ref Rng & Units 07/28/2021 05/30/2021 04/29/2021  WBC 4.0 - 10.5 K/uL 4.6 3.7(L) 3.7(L)  Hemoglobin 13.0 - 17.0 g/dL 15.1 13.3 14.5  Hematocrit 39.0 - 52.0 % 40.3 35.7(L) 39.7  Platelets 150 - 400 K/uL 208 172 148(L)     CMP Latest Ref Rng & Units 07/28/2021 05/30/2021 02/26/2021  Glucose 70 - 99 mg/dL 104(H) 87 106(H)  BUN 6 - 20 mg/dL 18 23(H) 11  Creatinine 0.61 - 1.24 mg/dL 0.98 1.36(H) 1.00  Sodium 135 - 145 mmol/L 139 141 140  Potassium 3.5 - 5.1 mmol/L 3.9 4.3 4.0  Chloride 98 - 111 mmol/L 105 107 105  CO2 22 - 32 mmol/L $RemoveB'24 22 25  'bpQgbHWJ$ Calcium 8.9 - 10.3 mg/dL 9.7  9.2 9.8  Total Protein 6.5 - 8.1 g/dL 7.6 7.0 7.2  Total Bilirubin 0.3 - 1.2 mg/dL 1.1 0.6 1.7(H)  Alkaline Phos 38 - 126 U/L 84 91 102  AST 15 - 41 U/L 26 31 40  ALT 0 - 44 U/L 25 35 48(H)      RADIOGRAPHIC STUDIES: I have personally reviewed the radiological images as listed and agreed with the findings in the report. No results found.    No orders of the defined types were placed in this encounter.  All questions were answered. The patient knows to call the clinic with any problems, questions or concerns. No barriers to learning was detected.      Truitt Merle, MD 08/21/2021   I, Wilburn Mylar, am acting as scribe for Truitt Merle, MD.   I have reviewed the above documentation for accuracy and completeness, and I agree with the above.

## 2021-08-27 ENCOUNTER — Other Ambulatory Visit (HOSPITAL_COMMUNITY): Payer: Self-pay

## 2021-09-15 DIAGNOSIS — F411 Generalized anxiety disorder: Secondary | ICD-10-CM | POA: Diagnosis not present

## 2021-09-15 DIAGNOSIS — F329 Major depressive disorder, single episode, unspecified: Secondary | ICD-10-CM | POA: Diagnosis not present

## 2021-09-17 ENCOUNTER — Other Ambulatory Visit (HOSPITAL_COMMUNITY): Payer: Self-pay

## 2021-09-19 ENCOUNTER — Other Ambulatory Visit (HOSPITAL_COMMUNITY): Payer: Self-pay

## 2021-09-20 IMAGING — CT CT ABD-PELV W/ CM
2 of 5 series · 16 of 46 positions shown, 18 images · IV contrast (OMNIPAQUE)
Comparison: 07/12/2019

CLINICAL DATA: Colorectal carcinoma. Restaging.

EXAM:
CT ABDOMEN AND PELVIS WITH CONTRAST
TECHNIQUE: Multidetector CT imaging of the abdomen and pelvis was performed
using the standard protocol following bolus administration of
intravenous contrast.
CONTRAST:  100mL OMNIPAQUE IOHEXOL 300 MG/ML  SOLN

[Series 2: axial st · axial · 0.79mm/px · z∈[+947,+1362]mm · 13 of 97 slices shown, 15 images]
[im 7/97  soft-tissue]
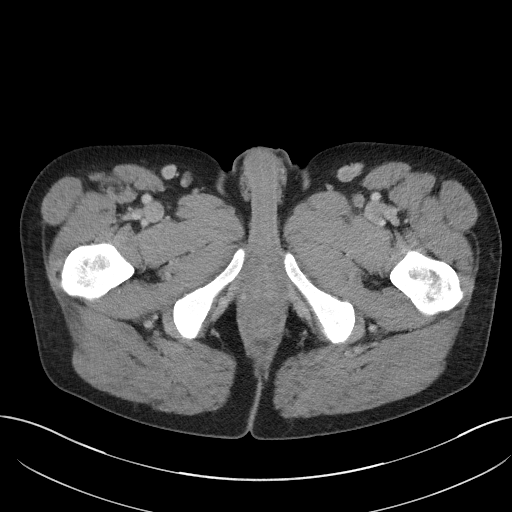
[im 7/97  bone]
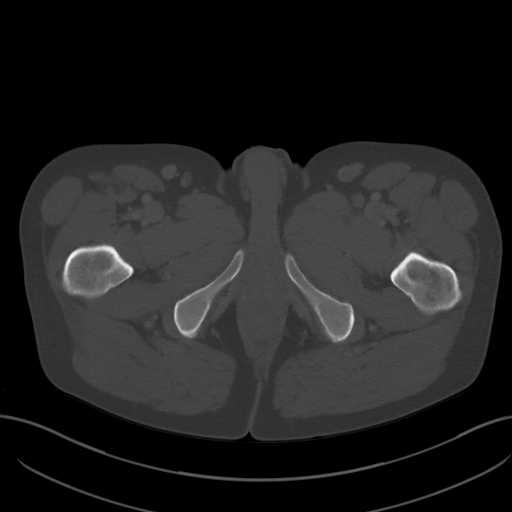
[im 14/97  soft-tissue]
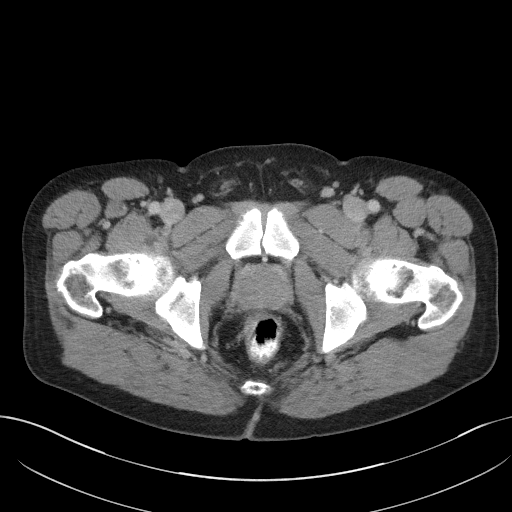
[im 21/97  soft-tissue]
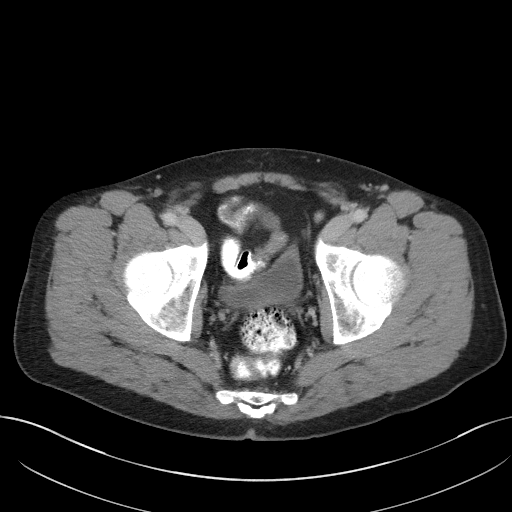
[im 28/97  soft-tissue]
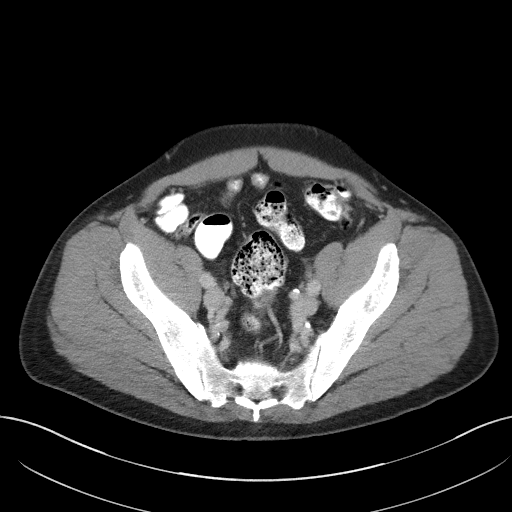
[im 35/97  soft-tissue]
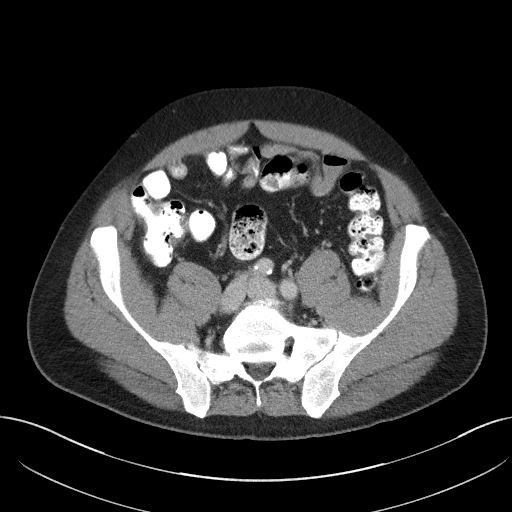
[im 42/97  soft-tissue]
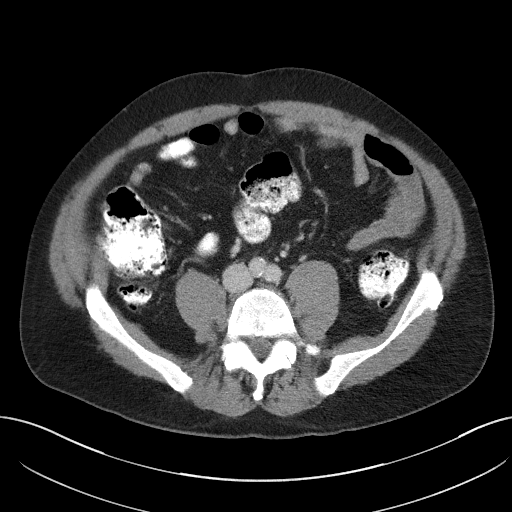
[im 49/97  soft-tissue]
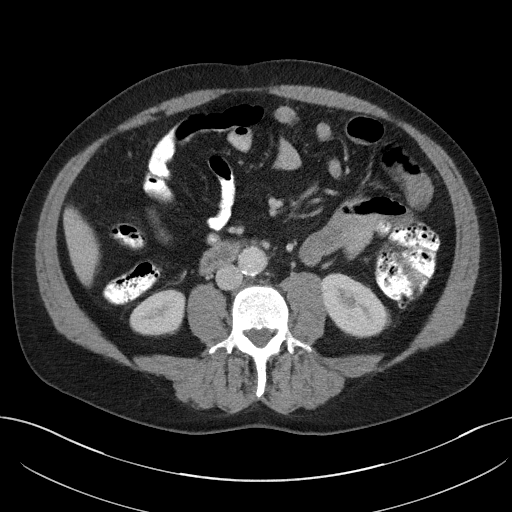
[im 55/97  soft-tissue]
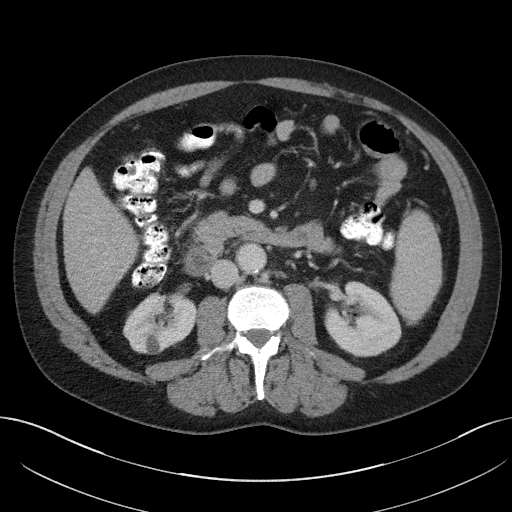
[im 62/97  soft-tissue]
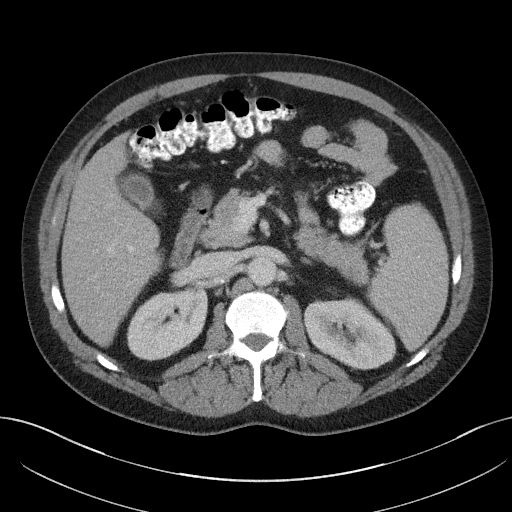
[im 62/97  bone]
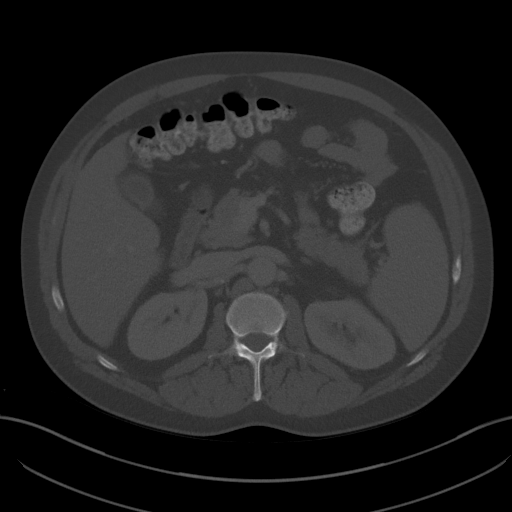
[im 69/97  soft-tissue]
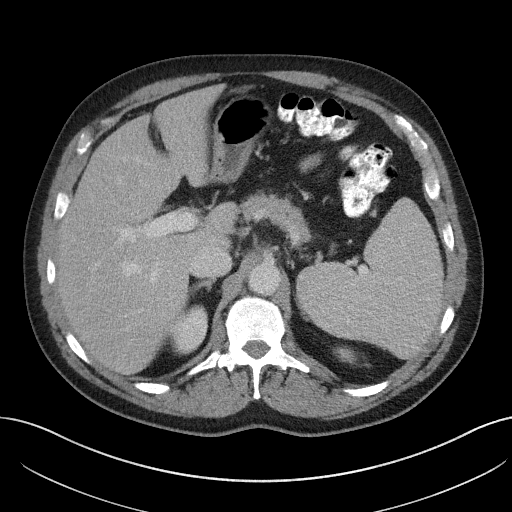
[im 76/97  soft-tissue]
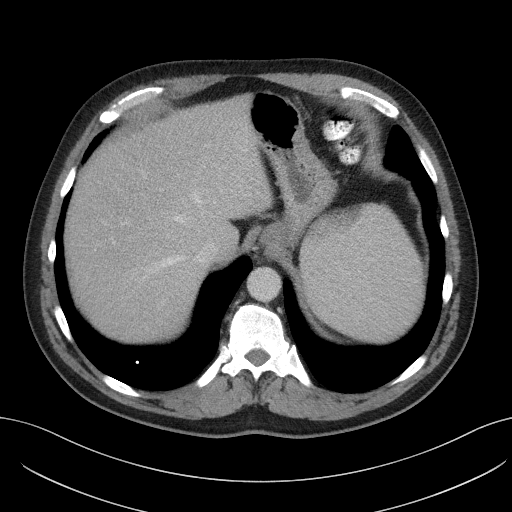
[im 83/97  soft-tissue]
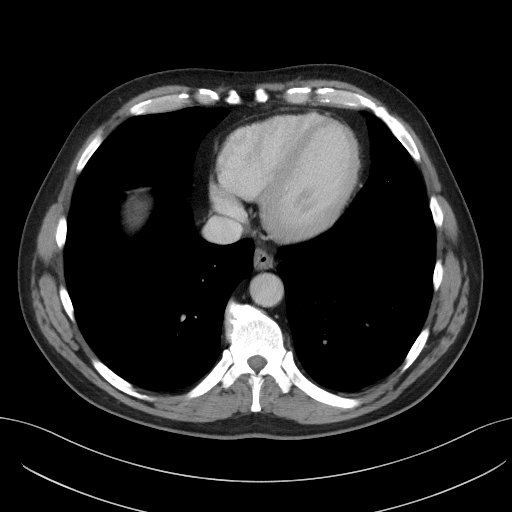
[im 90/97  soft-tissue]
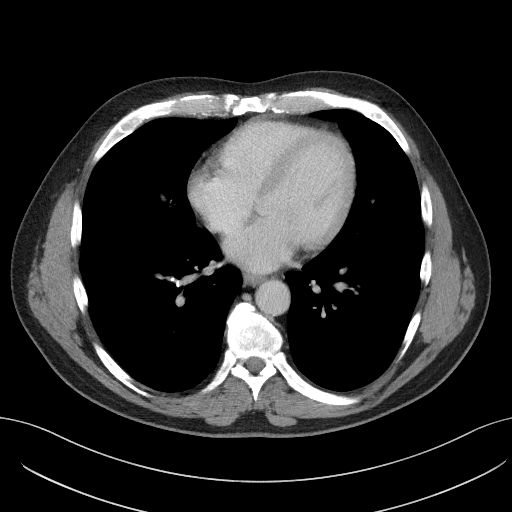

[Series 4: coronal st · coronal · 0.79mm/px · 3 of 100 slices shown]
[im 34/100  soft-tissue]
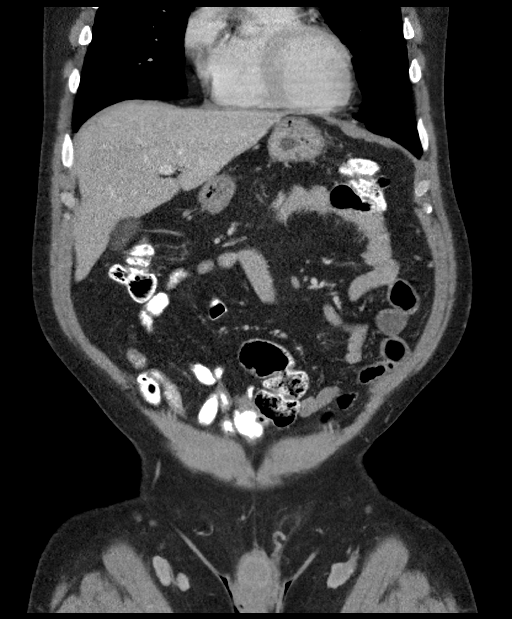
[im 45/100  soft-tissue]
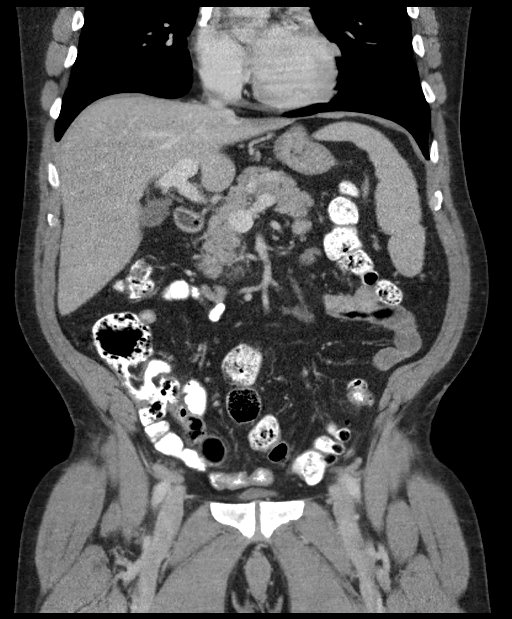
[im 56/100  soft-tissue]
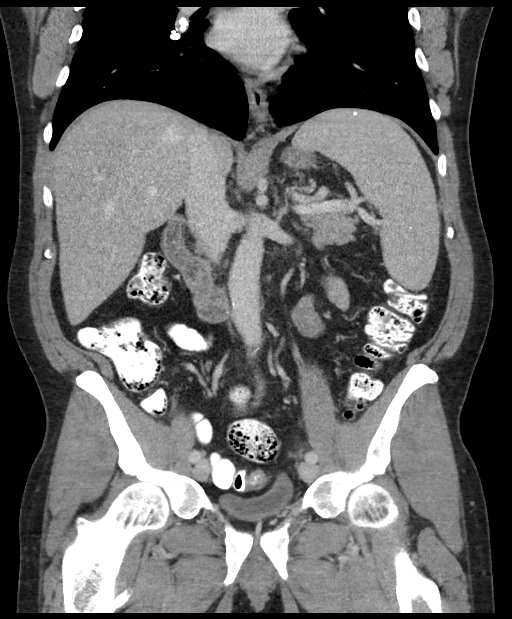

[16 of 46 positions shown; findings below may reference images not displayed]

FINDINGS: Lower chest: No acute abnormality. Several calcified granulomas are
identified in the right lung. Calcified right hilar and subcarinal
lymph nodes identified compatible with prior granulomatous disease.
Noncalcified nodule within the anterolateral right lower lobe
measures 4 mm and is unchanged from 07/12/2019

Hepatobiliary: No suspicious liver lesion. Multiple small stones
identified within the gallbladder. No gallbladder inflammation or
biliary ductal dilatation

Pancreas: Unremarkable. No pancreatic ductal dilatation or
surrounding inflammatory changes.

Spleen: The spleen is enlarged on today's study measuring 15.0 by
8.2 x 11.7 cm (volume = 750 cm^3), image [DATE].

Adrenals/Urinary Tract: Normal appearance of the adrenal glands. The
left kidney appears normal. Right kidney cyst measures 1.4 cm.
Urinary bladder is unremarkable.

Stomach/Bowel: Stomach appears normal. No dilated loops of small
bowel. No bowel wall thickening, inflammation or distension. Distal
colonic diverticulosis noted without acute inflammation.

Vascular/Lymphatic: Aortic atherosclerosis. No abdominal adenopathy.
No iliac adenopathy. Upper limits of normal in size inguinal lymph
nodes are identified. Index right inguinal lymph node measures 1 cm,
image 91/2. Stable from previous exam. Previous right posterior
perirectal lymph node has resolved in the interval. Left posterior
perirectal lymph node measures 4 mm, image 79/2. Previously 5 mm.

Reproductive: Prostate is unremarkable.

Other: No free fluid or fluid collections. No peritoneal nodule or
mass.

Musculoskeletal: No acute or significant osseous findings.
IMPRESSION: 1. Stable exam. No evidence for metastatic disease within the
abdomen or pelvis.
2. Splenomegaly.  New from previous exam.
3. Gallstones.

Aortic Atherosclerosis ([id number]-366.6).

## 2021-09-22 ENCOUNTER — Other Ambulatory Visit (HOSPITAL_COMMUNITY): Payer: Self-pay

## 2021-09-24 ENCOUNTER — Other Ambulatory Visit (HOSPITAL_COMMUNITY): Payer: Self-pay

## 2021-10-20 ENCOUNTER — Other Ambulatory Visit (HOSPITAL_COMMUNITY): Payer: Self-pay

## 2021-10-21 ENCOUNTER — Other Ambulatory Visit (HOSPITAL_COMMUNITY): Payer: Self-pay

## 2021-11-11 ENCOUNTER — Other Ambulatory Visit (HOSPITAL_COMMUNITY): Payer: Self-pay

## 2021-11-13 ENCOUNTER — Other Ambulatory Visit (HOSPITAL_COMMUNITY): Payer: Self-pay

## 2021-11-17 ENCOUNTER — Other Ambulatory Visit (HOSPITAL_COMMUNITY): Payer: Self-pay

## 2021-11-17 DIAGNOSIS — E782 Mixed hyperlipidemia: Secondary | ICD-10-CM | POA: Diagnosis not present

## 2021-11-17 DIAGNOSIS — R413 Other amnesia: Secondary | ICD-10-CM | POA: Diagnosis not present

## 2021-11-17 DIAGNOSIS — I10 Essential (primary) hypertension: Secondary | ICD-10-CM | POA: Diagnosis not present

## 2021-11-17 DIAGNOSIS — Z23 Encounter for immunization: Secondary | ICD-10-CM | POA: Diagnosis not present

## 2021-11-17 DIAGNOSIS — R7301 Impaired fasting glucose: Secondary | ICD-10-CM | POA: Diagnosis not present

## 2021-11-17 DIAGNOSIS — F329 Major depressive disorder, single episode, unspecified: Secondary | ICD-10-CM | POA: Diagnosis not present

## 2021-11-17 DIAGNOSIS — F411 Generalized anxiety disorder: Secondary | ICD-10-CM | POA: Diagnosis not present

## 2021-11-25 ENCOUNTER — Other Ambulatory Visit (HOSPITAL_COMMUNITY): Payer: Self-pay

## 2021-11-26 ENCOUNTER — Other Ambulatory Visit (HOSPITAL_COMMUNITY): Payer: Self-pay

## 2021-12-11 ENCOUNTER — Other Ambulatory Visit: Payer: Self-pay

## 2021-12-11 ENCOUNTER — Other Ambulatory Visit: Payer: BC Managed Care – PPO

## 2021-12-11 ENCOUNTER — Telehealth: Payer: Self-pay | Admitting: Hematology

## 2021-12-11 ENCOUNTER — Telehealth: Payer: Self-pay

## 2021-12-11 DIAGNOSIS — B20 Human immunodeficiency virus [HIV] disease: Secondary | ICD-10-CM

## 2021-12-11 DIAGNOSIS — Z113 Encounter for screening for infections with a predominantly sexual mode of transmission: Secondary | ICD-10-CM | POA: Diagnosis not present

## 2021-12-11 NOTE — Telephone Encounter (Signed)
Patient due for yearly follow up appointment.   He also needs labs done to apply for patient assistance for his Biktarvy.   Called him to schedule lab and follow up appointment, no answer. Left HIPAA compliant voicemail requesting callback.   Beryle Flock, RN

## 2021-12-11 NOTE — Telephone Encounter (Signed)
Rescheduled appointment per providers pal. Left message with new appointment times.

## 2021-12-12 ENCOUNTER — Encounter: Payer: Self-pay | Admitting: Hematology

## 2021-12-12 LAB — T-HELPER CELL (CD4) - (RCID CLINIC ONLY)
CD4 % Helper T Cell: 33 % (ref 33–65)
CD4 T Cell Abs: 376 /uL — ABNORMAL LOW (ref 400–1790)

## 2021-12-16 ENCOUNTER — Encounter: Payer: Self-pay | Admitting: Hematology

## 2021-12-16 LAB — CBC
HCT: 40.9 % (ref 38.5–50.0)
Hemoglobin: 14.2 g/dL (ref 13.2–17.1)
MCH: 35.5 pg — ABNORMAL HIGH (ref 27.0–33.0)
MCHC: 34.7 g/dL (ref 32.0–36.0)
MCV: 102.3 fL — ABNORMAL HIGH (ref 80.0–100.0)
MPV: 9.6 fL (ref 7.5–12.5)
Platelets: 186 Thousand/uL (ref 140–400)
RBC: 4 Million/uL — ABNORMAL LOW (ref 4.20–5.80)
RDW: 12.2 % (ref 11.0–15.0)
WBC: 4.5 Thousand/uL (ref 3.8–10.8)

## 2021-12-16 LAB — RPR: RPR Ser Ql: NONREACTIVE

## 2021-12-16 LAB — COMPREHENSIVE METABOLIC PANEL
AG Ratio: 1.7 (calc) (ref 1.0–2.5)
ALT: 43 U/L (ref 9–46)
AST: 36 U/L — ABNORMAL HIGH (ref 10–35)
Albumin: 4.3 g/dL (ref 3.6–5.1)
Alkaline phosphatase (APISO): 97 U/L (ref 35–144)
BUN: 12 mg/dL (ref 7–25)
CO2: 23 mmol/L (ref 20–32)
Calcium: 9.2 mg/dL (ref 8.6–10.3)
Chloride: 106 mmol/L (ref 98–110)
Creat: 0.93 mg/dL (ref 0.70–1.30)
Globulin: 2.5 g/dL (calc) (ref 1.9–3.7)
Glucose, Bld: 97 mg/dL (ref 65–99)
Potassium: 4 mmol/L (ref 3.5–5.3)
Sodium: 139 mmol/L (ref 135–146)
Total Bilirubin: 0.7 mg/dL (ref 0.2–1.2)
Total Protein: 6.8 g/dL (ref 6.1–8.1)

## 2021-12-16 LAB — LIPID PANEL
Cholesterol: 192 mg/dL (ref <200)
HDL: 52 mg/dL (ref >=40)
LDL Cholesterol (Calc): 100 mg/dL (calc) — ABNORMAL HIGH
Non-HDL Cholesterol (Calc): 140 mg/dL (calc) — ABNORMAL HIGH (ref <130)
Total CHOL/HDL Ratio: 3.7 (calc) (ref <5.0)
Triglycerides: 278 mg/dL — ABNORMAL HIGH (ref <150)

## 2021-12-16 LAB — HIV-1 RNA QUANT-NO REFLEX-BLD
HIV 1 RNA Quant: NOT DETECTED copies/mL
HIV-1 RNA Quant, Log: NOT DETECTED Log copies/mL

## 2021-12-18 ENCOUNTER — Other Ambulatory Visit (HOSPITAL_COMMUNITY): Payer: Self-pay

## 2021-12-19 ENCOUNTER — Other Ambulatory Visit (HOSPITAL_COMMUNITY): Payer: Self-pay

## 2021-12-19 ENCOUNTER — Other Ambulatory Visit: Payer: BC Managed Care – PPO

## 2021-12-19 ENCOUNTER — Ambulatory Visit: Payer: BC Managed Care – PPO | Admitting: Hematology

## 2021-12-21 ENCOUNTER — Encounter: Payer: Self-pay | Admitting: Infectious Diseases

## 2021-12-25 ENCOUNTER — Encounter: Payer: Self-pay | Admitting: Internal Medicine

## 2021-12-25 ENCOUNTER — Encounter: Payer: Self-pay | Admitting: Hematology

## 2021-12-25 ENCOUNTER — Other Ambulatory Visit: Payer: Self-pay

## 2021-12-25 ENCOUNTER — Other Ambulatory Visit (HOSPITAL_COMMUNITY): Payer: Self-pay

## 2021-12-25 ENCOUNTER — Ambulatory Visit: Payer: BC Managed Care – PPO | Admitting: Internal Medicine

## 2021-12-25 ENCOUNTER — Telehealth: Payer: Self-pay

## 2021-12-25 DIAGNOSIS — B20 Human immunodeficiency virus [HIV] disease: Secondary | ICD-10-CM

## 2021-12-25 MED ORDER — BIKTARVY 50-200-25 MG PO TABS: 1.0000 | ORAL_TABLET | Freq: Every day | ORAL | 11 refills | Status: DC

## 2021-12-25 NOTE — Progress Notes (Signed)
Patient Active Problem List   Diagnosis Date Noted   Rectal adenocarcinoma ([place]) 08/21/2019    Priority: High   HIV disease ([place]) 11/11/2016    Priority: High   Status post reversal of ileostomy 06/14/2020   Hyperglycemia 06/14/2020   Neuropathy    Hypertension    History of chemotherapy    Ileostomy present ([place]) 06/12/2020   Impaired fasting glucose 02/02/2020   Injury of tendon of triceps 03/03/2019   Mixed hyperlipidemia 07/26/2018   Low back pain 09/29/2017   Essential hypertension 04/01/2017   Renal insufficiency 03/31/2017   Dyslipidemia 03/31/2017   Tinea versicolor 04/22/2016    Patient's Medications  New Prescriptions   No medications on file  Previous Medications   AMLODIPINE (NORVASC) 5 MG TABLET    Take 5 mg by mouth daily.    ATORVASTATIN (LIPITOR) 10 MG TABLET    Take 10 mg by mouth daily.    LOSARTAN (COZAAR) 25 MG TABLET    Take 25 mg by mouth daily.    MULTIPLE VITAMIN (MULTIVITAMIN) TABLET    Take 1 tablet by mouth daily.   SERTRALINE (ZOLOFT) 50 MG TABLET    Take 1 tablet (50 mg total) by mouth daily.  Modified Medications   Modified Medication Previous Medication   BICTEGRAVIR-EMTRICITABINE-TENOFOVIR AF (BIKTARVY) 50-200-25 MG TABS TABLET bictegravir-emtricitabine-tenofovir AF (BIKTARVY) 50-200-25 MG TABS tablet      TAKE 1 TABLET BY MOUTH DAILY.    TAKE 1 TABLET BY MOUTH DAILY.  Discontinued Medications   No medications on file    Subjective: Joshua Zhang is in for his routine HIV follow-up visit.  He denies any problems obtaining, taking or tolerating his Biktarvy and does not recall missing doses.  He takes it each morning when he first gets up.  He is not on any new medications since his last visit.  He denies feeling anxious or depressed.  He has started getting more regular exercise.  His follow-up CT scan in January did not show any evidence of recurrence of his rectal cancer.  Review of Systems: Review of Systems  Constitutional:   Negative for fever and weight loss.  Psychiatric/Behavioral:  Negative for depression.     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    Port-A-Cath in place 10/12/2019   rectal ca dx'd 06/2019   Renal insufficiency     Social History   Tobacco Use   Smoking status: Former    Packs/day: 0.25    Years: 5.00    Total pack years: 1.25    Types: Cigarettes    Quit date: 2003    Years since quitting: 20.4   Smokeless tobacco: Never   Tobacco comments:    Quit 18 years ago  Vaping Use   Vaping Use: Never used  Substance Use Topics   Alcohol use: Yes    Comment: 5-6 driniks per week    Drug use: Not Currently    Family History  Problem Relation Age of Onset   Breast cancer Mother    Aortic aneurysm Father     No Known Allergies  Health Maintenance  Topic Date Due   COVID-19 Vaccine (4 - Booster for Pfizer series) 10/16/2020   INFLUENZA VACCINE  02/17/2022   TETANUS/TDAP  10/22/2022   COLONOSCOPY (Pts 45-15yr Insurance  coverage will need to be confirmed)  09/06/2030   Hepatitis C Screening  Completed   HIV Screening  Completed   Zoster Vaccines- Shingrix  Completed   HPV VACCINES  Aged Out    Objective:  Vitals:   12/25/21 1043  BP: (!) 148/84  Pulse: 74  Temp: 97.7 F (36.5 C)  TempSrc: Oral  SpO2: 96%  Weight: 218 lb (98.9 kg)   Body mass index is 30.4 kg/m.  Physical Exam Constitutional:      Comments: He is in good spirits.  Cardiovascular:     Rate and Rhythm: Normal rate and regular rhythm.     Heart sounds: No murmur heard. Pulmonary:     Effort: Pulmonary effort is normal.     Breath sounds: Normal breath sounds.  Psychiatric:        Mood and Affect: Mood normal.     Lab Results Lab Results  Component Value Date   WBC 4.5 12/11/2021   HGB 14.2 12/11/2021   HCT 40.9 12/11/2021   MCV 102.3  (H) 12/11/2021   PLT 186 12/11/2021    Lab Results  Component Value Date   CREATININE 0.93 12/11/2021   BUN 12 12/11/2021   NA 139 12/11/2021   K 4.0 12/11/2021   CL 106 12/11/2021   CO2 23 12/11/2021    Lab Results  Component Value Date   ALT 43 12/11/2021   AST 36 (H) 12/11/2021   ALKPHOS 84 07/28/2021   BILITOT 0.7 12/11/2021    Lab Results  Component Value Date   CHOL 192 12/11/2021   HDL 52 12/11/2021   LDLCALC 100 (H) 12/11/2021   TRIG 278 (H) 12/11/2021   CHOLHDL 3.7 12/11/2021   Lab Results  Component Value Date   LABRPR NON-REACTIVE 12/11/2021   HIV 1 RNA Quant  Date Value  12/11/2021 NOT DETECTED copies/mL  12/24/2020 Not Detected Copies/mL  06/07/2020 <20 Copies/mL (H)   CD4 T Cell Abs (/uL)  Date Value  12/11/2021 376 (L)  12/24/2020 248 (L)  06/07/2020 294 (L)     Problem List Items Addressed This Visit       High   HIV disease ([place])    His infection remains under excellent, long-term control.  He will continue Biktarvy and follow-up after lab work in 1 year.      Relevant Medications   bictegravir-emtricitabine-tenofovir AF (BIKTARVY) 50-200-25 MG TABS tablet   Other Relevant Orders   CBC   T-helper cells (CD4) count (not at [facility])   Comprehensive metabolic panel   Lipid panel   RPR   HIV-1 RNA quant-no reflex-bld      Michel Bickers, MD [facility] for Infect[facility] [place] [facility] [phone] pager   [phone] cell 12/25/2021, 11:04 AM

## 2021-12-25 NOTE — Telephone Encounter (Signed)
[facility] Patient Advocate Encounter   Received notification from [facility] that prior authorization for [employer] is required.   PA submitted on 12/25/21 Key [id number] Status is pending    [facility] will continue to follow.   Ileene Patrick, [facility] Patient [facility] for Infectious Disease Phone: [phone] Fax:  [phone]

## 2021-12-25 NOTE — Assessment & Plan Note (Signed)
His infection remains under excellent, long-term control.  He will continue Biktarvy and follow-up after lab work in 1 year. 

## 2021-12-26 ENCOUNTER — Other Ambulatory Visit (HOSPITAL_COMMUNITY): Payer: Self-pay

## 2021-12-29 ENCOUNTER — Other Ambulatory Visit: Payer: Self-pay | Admitting: Internal Medicine

## 2021-12-29 ENCOUNTER — Telehealth: Payer: Self-pay

## 2021-12-29 ENCOUNTER — Other Ambulatory Visit: Payer: Self-pay | Admitting: Pharmacist

## 2021-12-29 ENCOUNTER — Other Ambulatory Visit (HOSPITAL_COMMUNITY): Payer: Self-pay

## 2021-12-29 MED ORDER — BIKTARVY 50-200-25 MG PO TABS
1.0000 | ORAL_TABLET | Freq: Every day | ORAL | 11 refills | Status: DC
  Filled 2021-12-29: qty 30, 30d supply, fill #0
  Filled 2022-01-23: qty 30, 30d supply, fill #1
  Filled 2022-02-17: qty 30, 30d supply, fill #2
  Filled 2022-03-13: qty 30, 30d supply, fill #3
  Filled 2022-04-10: qty 30, 30d supply, fill #4
  Filled 2022-05-05: qty 30, 30d supply, fill #5
  Filled 2022-06-15: qty 30, 30d supply, fill #6
  Filled 2022-07-16: qty 30, 30d supply, fill #7
  Filled 2022-08-14: qty 30, 30d supply, fill #8
  Filled 2022-09-15: qty 30, 30d supply, fill #9
  Filled 2022-10-12: qty 30, 30d supply, fill #10
  Filled 2022-11-16: qty 30, 30d supply, fill #11

## 2021-12-29 NOTE — Telephone Encounter (Signed)
[facility] Patient Advocate Encounter  Prior Authorization for [employer] has been approved.     Effective dates: 12/26/21 through 12/26/22  Patients co-pay is $0.00.   [facility] will continue to follow.  Ileene Patrick, [facility] Patient [facility] for Infectious Disease Phone: [phone] Fax:  [phone]

## 2021-12-29 NOTE — Telephone Encounter (Signed)
Need to confirm patient's pharmacy

## 2021-12-30 ENCOUNTER — Other Ambulatory Visit (HOSPITAL_COMMUNITY): Payer: Self-pay

## 2022-01-09 ENCOUNTER — Other Ambulatory Visit: Payer: Self-pay

## 2022-01-09 DIAGNOSIS — C20 Malignant neoplasm of rectum: Secondary | ICD-10-CM

## 2022-01-12 ENCOUNTER — Encounter: Payer: Self-pay | Admitting: Hematology

## 2022-01-12 ENCOUNTER — Inpatient Hospital Stay: Payer: BC Managed Care – PPO | Attending: Hematology

## 2022-01-12 ENCOUNTER — Inpatient Hospital Stay (HOSPITAL_BASED_OUTPATIENT_CLINIC_OR_DEPARTMENT_OTHER): Payer: BC Managed Care – PPO | Admitting: Hematology

## 2022-01-12 ENCOUNTER — Other Ambulatory Visit: Payer: Self-pay

## 2022-01-12 VITALS — BP 142/82 | HR 73 | Temp 98.4°F | Resp 18 | Ht 71.0 in | Wt 220.0 lb

## 2022-01-12 DIAGNOSIS — G4700 Insomnia, unspecified: Secondary | ICD-10-CM | POA: Insufficient documentation

## 2022-01-12 DIAGNOSIS — D123 Benign neoplasm of transverse colon: Secondary | ICD-10-CM | POA: Diagnosis not present

## 2022-01-12 DIAGNOSIS — F419 Anxiety disorder, unspecified: Secondary | ICD-10-CM | POA: Insufficient documentation

## 2022-01-12 DIAGNOSIS — Z9049 Acquired absence of other specified parts of digestive tract: Secondary | ICD-10-CM | POA: Insufficient documentation

## 2022-01-12 DIAGNOSIS — R197 Diarrhea, unspecified: Secondary | ICD-10-CM | POA: Diagnosis not present

## 2022-01-12 DIAGNOSIS — D125 Benign neoplasm of sigmoid colon: Secondary | ICD-10-CM | POA: Insufficient documentation

## 2022-01-12 DIAGNOSIS — Z9221 Personal history of antineoplastic chemotherapy: Secondary | ICD-10-CM | POA: Diagnosis not present

## 2022-01-12 DIAGNOSIS — I700 Atherosclerosis of aorta: Secondary | ICD-10-CM | POA: Insufficient documentation

## 2022-01-12 DIAGNOSIS — D124 Benign neoplasm of descending colon: Secondary | ICD-10-CM | POA: Diagnosis not present

## 2022-01-12 DIAGNOSIS — Z79899 Other long term (current) drug therapy: Secondary | ICD-10-CM | POA: Insufficient documentation

## 2022-01-12 DIAGNOSIS — Z21 Asymptomatic human immunodeficiency virus [HIV] infection status: Secondary | ICD-10-CM | POA: Diagnosis not present

## 2022-01-12 DIAGNOSIS — C20 Malignant neoplasm of rectum: Secondary | ICD-10-CM

## 2022-01-12 DIAGNOSIS — I10 Essential (primary) hypertension: Secondary | ICD-10-CM | POA: Insufficient documentation

## 2022-01-12 DIAGNOSIS — D122 Benign neoplasm of ascending colon: Secondary | ICD-10-CM | POA: Diagnosis not present

## 2022-01-12 DIAGNOSIS — E785 Hyperlipidemia, unspecified: Secondary | ICD-10-CM | POA: Insufficient documentation

## 2022-01-12 LAB — COMPREHENSIVE METABOLIC PANEL
ALT: 44 U/L (ref 0–44)
AST: 38 U/L (ref 15–41)
Albumin: 4.2 g/dL (ref 3.5–5.0)
Alkaline Phosphatase: 91 U/L (ref 38–126)
Anion gap: 9 (ref 5–15)
BUN: 18 mg/dL (ref 6–20)
CO2: 25 mmol/L (ref 22–32)
Calcium: 9.1 mg/dL (ref 8.9–10.3)
Chloride: 103 mmol/L (ref 98–111)
Creatinine, Ser: 1.01 mg/dL (ref 0.61–1.24)
GFR, Estimated: >60 mL/min (ref >60)
Glucose, Bld: 98 mg/dL (ref 70–99)
Potassium: 3.8 mmol/L (ref 3.5–5.1)
Sodium: 137 mmol/L (ref 135–145)
Total Bilirubin: 0.8 mg/dL (ref 0.3–1.2)
Total Protein: 7 g/dL (ref 6.5–8.1)

## 2022-01-12 LAB — CBC WITH DIFFERENTIAL/PLATELET
Abs Immature Granulocytes: 0.02 10*3/uL (ref 0.00–0.07)
Basophils Absolute: 0 10*3/uL (ref 0.0–0.1)
Basophils Relative: 0 %
Eosinophils Absolute: 0.2 10*3/uL (ref 0.0–0.5)
Eosinophils Relative: 3 %
HCT: 37.4 % — ABNORMAL LOW (ref 39.0–52.0)
Hemoglobin: 13.9 g/dL (ref 13.0–17.0)
Immature Granulocytes: 0 %
Lymphocytes Relative: 25 %
Lymphs Abs: 1.3 10*3/uL (ref 0.7–4.0)
MCH: 35.1 pg — ABNORMAL HIGH (ref 26.0–34.0)
MCHC: 37.2 g/dL — ABNORMAL HIGH (ref 30.0–36.0)
MCV: 94.4 fL (ref 80.0–100.0)
Monocytes Absolute: 0.4 10*3/uL (ref 0.1–1.0)
Monocytes Relative: 8 %
Neutro Abs: 3.3 10*3/uL (ref 1.7–7.7)
Neutrophils Relative %: 64 %
Platelets: 172 10*3/uL (ref 150–400)
RBC: 3.96 MIL/uL — ABNORMAL LOW (ref 4.22–5.81)
RDW: 11.5 % (ref 11.5–15.5)
WBC: 5.2 10*3/uL (ref 4.0–10.5)
nRBC: 0 % (ref 0.0–0.2)

## 2022-01-12 LAB — CEA (IN HOUSE-CHCC): CEA (CHCC-In House): 1.68 ng/mL (ref 0.00–5.00)

## 2022-01-14 ENCOUNTER — Other Ambulatory Visit (HOSPITAL_COMMUNITY): Payer: Self-pay

## 2022-01-19 ENCOUNTER — Other Ambulatory Visit (HOSPITAL_COMMUNITY): Payer: Self-pay

## 2022-01-23 ENCOUNTER — Other Ambulatory Visit (HOSPITAL_COMMUNITY): Payer: Self-pay

## 2022-01-26 ENCOUNTER — Other Ambulatory Visit (HOSPITAL_COMMUNITY): Payer: Self-pay

## 2022-02-09 ENCOUNTER — Other Ambulatory Visit: Payer: Self-pay

## 2022-02-17 ENCOUNTER — Other Ambulatory Visit (HOSPITAL_COMMUNITY): Payer: Self-pay

## 2022-02-23 ENCOUNTER — Other Ambulatory Visit (HOSPITAL_COMMUNITY): Payer: Self-pay

## 2022-03-13 ENCOUNTER — Other Ambulatory Visit (HOSPITAL_COMMUNITY): Payer: Self-pay

## 2022-03-19 ENCOUNTER — Other Ambulatory Visit (HOSPITAL_COMMUNITY): Payer: Self-pay

## 2022-03-20 ENCOUNTER — Other Ambulatory Visit: Payer: Self-pay

## 2022-03-24 ENCOUNTER — Other Ambulatory Visit (HOSPITAL_COMMUNITY): Payer: Self-pay

## 2022-04-08 ENCOUNTER — Other Ambulatory Visit (HOSPITAL_COMMUNITY): Payer: Self-pay

## 2022-04-09 ENCOUNTER — Other Ambulatory Visit (HOSPITAL_COMMUNITY): Payer: Self-pay

## 2022-04-10 ENCOUNTER — Other Ambulatory Visit (HOSPITAL_COMMUNITY): Payer: Self-pay

## 2022-04-20 ENCOUNTER — Other Ambulatory Visit (HOSPITAL_COMMUNITY): Payer: Self-pay

## 2022-05-05 ENCOUNTER — Other Ambulatory Visit (HOSPITAL_COMMUNITY): Payer: Self-pay

## 2022-05-14 ENCOUNTER — Inpatient Hospital Stay (HOSPITAL_BASED_OUTPATIENT_CLINIC_OR_DEPARTMENT_OTHER): Payer: BC Managed Care – PPO | Admitting: Hematology

## 2022-05-14 ENCOUNTER — Inpatient Hospital Stay: Payer: BC Managed Care – PPO | Attending: Hematology

## 2022-05-14 ENCOUNTER — Other Ambulatory Visit: Payer: Self-pay

## 2022-05-14 VITALS — BP 147/100 | HR 75 | Temp 98.1°F | Ht 71.0 in | Wt 228.3 lb

## 2022-05-14 DIAGNOSIS — Z79899 Other long term (current) drug therapy: Secondary | ICD-10-CM | POA: Insufficient documentation

## 2022-05-14 DIAGNOSIS — E785 Hyperlipidemia, unspecified: Secondary | ICD-10-CM | POA: Diagnosis not present

## 2022-05-14 DIAGNOSIS — Z9221 Personal history of antineoplastic chemotherapy: Secondary | ICD-10-CM | POA: Insufficient documentation

## 2022-05-14 DIAGNOSIS — F419 Anxiety disorder, unspecified: Secondary | ICD-10-CM | POA: Diagnosis not present

## 2022-05-14 DIAGNOSIS — C20 Malignant neoplasm of rectum: Secondary | ICD-10-CM | POA: Diagnosis not present

## 2022-05-14 DIAGNOSIS — Z21 Asymptomatic human immunodeficiency virus [HIV] infection status: Secondary | ICD-10-CM | POA: Diagnosis not present

## 2022-05-14 DIAGNOSIS — I10 Essential (primary) hypertension: Secondary | ICD-10-CM | POA: Insufficient documentation

## 2022-05-14 DIAGNOSIS — Z923 Personal history of irradiation: Secondary | ICD-10-CM | POA: Insufficient documentation

## 2022-05-14 DIAGNOSIS — Z85048 Personal history of other malignant neoplasm of rectum, rectosigmoid junction, and anus: Secondary | ICD-10-CM | POA: Diagnosis not present

## 2022-05-14 LAB — CBC WITH DIFFERENTIAL/PLATELET
Abs Immature Granulocytes: 0.03 10*3/uL (ref 0.00–0.07)
Basophils Absolute: 0 10*3/uL (ref 0.0–0.1)
Basophils Relative: 1 %
Eosinophils Absolute: 0.1 10*3/uL (ref 0.0–0.5)
Eosinophils Relative: 3 %
HCT: 38.2 % — ABNORMAL LOW (ref 39.0–52.0)
Hemoglobin: 14.1 g/dL (ref 13.0–17.0)
Immature Granulocytes: 1 %
Lymphocytes Relative: 25 %
Lymphs Abs: 1.4 10*3/uL (ref 0.7–4.0)
MCH: 35.3 pg — ABNORMAL HIGH (ref 26.0–34.0)
MCHC: 36.9 g/dL — ABNORMAL HIGH (ref 30.0–36.0)
MCV: 95.5 fL (ref 80.0–100.0)
Monocytes Absolute: 0.4 10*3/uL (ref 0.1–1.0)
Monocytes Relative: 7 %
Neutro Abs: 3.4 10*3/uL (ref 1.7–7.7)
Neutrophils Relative %: 63 %
Platelets: 201 10*3/uL (ref 150–400)
RBC: 4 MIL/uL — ABNORMAL LOW (ref 4.22–5.81)
RDW: 12.1 % (ref 11.5–15.5)
WBC: 5.4 10*3/uL (ref 4.0–10.5)
nRBC: 0 % (ref 0.0–0.2)

## 2022-05-14 LAB — COMPREHENSIVE METABOLIC PANEL
ALT: 59 U/L — ABNORMAL HIGH (ref 0–44)
AST: 38 U/L (ref 15–41)
Albumin: 4.1 g/dL (ref 3.5–5.0)
Alkaline Phosphatase: 86 U/L (ref 38–126)
Anion gap: 8 (ref 5–15)
BUN: 15 mg/dL (ref 6–20)
CO2: 25 mmol/L (ref 22–32)
Calcium: 9.2 mg/dL (ref 8.9–10.3)
Chloride: 106 mmol/L (ref 98–111)
Creatinine, Ser: 1.14 mg/dL (ref 0.61–1.24)
GFR, Estimated: >60 mL/min (ref >60)
Glucose, Bld: 100 mg/dL — ABNORMAL HIGH (ref 70–99)
Potassium: 4 mmol/L (ref 3.5–5.1)
Sodium: 139 mmol/L (ref 135–145)
Total Bilirubin: 0.8 mg/dL (ref 0.3–1.2)
Total Protein: 6.9 g/dL (ref 6.5–8.1)

## 2022-05-14 NOTE — Progress Notes (Signed)
[name]   [phone] [phone]   Clinic Follow up Note   Patient Care Team: Merwyn Katos as PCP - General (Physician Assistant) Michel Bickers, MD as Consulting Physician (Infectious Diseases) Leighton Ruff, MD as Consulting Physician (General Surgery) Truitt Merle, MD as Consulting Physician (Hematology) Kyung Rudd, MD as Consulting Physician (Radiation Oncology)  Date of Service:  05/14/2022  CHIEF COMPLAINT: f/u of rectal cancer  CURRENT THERAPY:  Surveillance  ASSESSMENT & PLAN:  Joshua Zhang is a 51 y.o. male with   1. Low rectal adenocarcinoma, cT3N1M0, Stage IIIB, MMR normal, ypT0N0 -low rectum mass found on screening colonoscopy in 06/2019. Biopsy showed moderately differentiated adenocarcinoma. Staged cT3N1 by MRI -s/p 8 cycles neoadjuvant FOLFOX, then concurrent  chemoRT with Xeloda.  -s/p surgery with Dr Marcello Moores on 04/03/20, path showed complete response with no residual carcinoma. Ileostomy takedown 06/12/20 (which caused hernia requiring repair) -surveillance colonoscopy 09/06/20 with Dr. Collene Mares was benign. He will reach out regarding repeat. -most recent CT AP on 08/19/21 showed NED and decrease in size of presacral soft tissue. Plan for one more routine scan in 08/2022. -he is clinically doing very well. He reports a recent bout of diarrhea, and I encouraged him to use imodium as needed and increase his electrolyte intake. Lab reviewed, overall stable. Physical exam, including rectal, was unremarkable. There is no clinical concern for recurrence.   2. Anxiety -related to cancer diagnosis and concern for recurrence -caused insomnia and increased alcohol use. -I prescribed Zoloft on 02/28/21; he is now on a lower dose. He has established care with Dr. Michail Sermon.   3. HIV, controlled  -He denies h/o or current use of recreational or IV drugs. He is unsure how he initially got infected.  -Controlled and undetectable.  09/15/18 CD4 was 350. His 10/09/19 CD4 T cells is 708  -He is on Biktavy. This is managed by Dr. Megan Salon.     4. Comorbidities: HLD, HTN -He will continue medications and f/u with PCP      PLAN:  -f/u in 4 months, with lab and CT several days before    No problem-specific Assessment & Plan notes found for this encounter.   SUMMARY OF ONCOLOGIC HISTORY: Oncology History Overview Note  Cancer Staging Rectal adenocarcinoma [facility]) Staging form: Colon and Rectum, AJCC 8th Edition - Clinical stage from 08/21/2019: Stage IIIB (cT3, cN1, cM0) - Signed by Truitt Merle, MD on 08/21/2019 Stage prefix: Initial diagnosis Histologic grade (G): G2 Histologic grading system: 4 grade system    Rectal adenocarcinoma ([place])  07/03/2019 Procedure   Colonoscopy by Dr. Collene Mares  IMPRESSION -Likely Malignant 5cm, circumferential, bleeding tumor in the rectum, 304 cm from the anal verge -biopsy done  -One 8 mm sessile polyp in the distal sigmoid colon, removed with a hot snare x2; resected and retrieved.  -Two small sessile polyps, 1 in the mid-descending colon and 1 in the mid transverse colon - removed by cold biopsies.  -One 57mm sessile polyp in the mid transverse colon, removed with a hot snare x1; resected and retrieved.  -One 9mm sessile polyp in the proximal ascending colon, removed with a hot snare X1; resected and retrieved.  -few large scattered diverticula.   07/03/2019 Initial Biopsy   FINAL DIAGNOSIS 07/03/19  A. Colon, Descending, Polyp, Polypectomy:   -TUBULAR ADENOMA   -No high grade dysplasia or malignancy.  B. Colon, transverse, polyp, polypectomy:   -TUBULAR ADENOMA  -No high grade dysplasia or malignancy. C. Colon, Ascending,  Polyp, Polypectomy:   -Fragments of sessile serrated adenoma/polyp D. Colon, sigmoid, polyp, polypectomy:   -Fragments of Tubular Adenoma  -No high grade dysplasia or malignancy. E. Rectum, Mass, Biopsy:   -INVASIVE COLONIC ADENOCARCINOMA,  MODERATELY-DIFFERENTIATED, see comment     07/12/2019 Imaging   CT AP W Contrast 07/12/19  IMPRESSION: 1. No evidence of metastatic disease within the chest, abdomen or pelvis. 2. Possible mild wall thickening in the sigmoid colon, although no focal colonic mass is identified. There are few distal colonic diverticula. 3. Sequela of prior granulomatous disease. 4. Possible cholelithiasis with mild wall thickening of the gallbladder fundus. 5. Aortic Atherosclerosis (ICD10-I70.0).   08/21/2019 Initial Diagnosis   Rectal adenocarcinoma ([place])   08/21/2019 Cancer Staging   Staging form: Colon and Rectum, AJCC 8th Edition - Clinical stage from 08/21/2019: Stage IIIB (cT3, cN1, cM0) - Signed by Truitt Merle, MD on 08/21/2019   08/31/2019 - 12/07/2019 Chemotherapy   FOLFOX q2weeks starting 08/31/19. Removed 5FU bolus and increased steroids pre-meds due to thrombocytopenia and skin rash starting with C4. Completed 12/07/19    11/21/2019 Imaging   CT AP W contrast  IMPRESSION: 1. Stable exam. No evidence for metastatic disease within the abdomen or pelvis. 2. Splenomegaly.  New from previous exam. 3. Gallstones.   Aortic Atherosclerosis (ICD10-I70.0).   12/25/2019 - 02/01/2020 Chemotherapy   Concurrent chemoRT with Xeloda $RemoveBef'2000mg'SXBhYIfoOZ$  in the AM and $Remo'1500mg'FBJsT$  in the PM on days of RT  12/25/19-7/15/2   12/25/2019 - 02/01/2020 Radiation Therapy   Concurrent  chemoRT with Dr Lisbeth Renshaw 12/25/19-02/01/20   04/03/2020 Surgery   XI ROBOTIC ASSISTED LOWER ANTERIOR RESECTION, SPLENIC FLEXURE MOBILIZATION, RIGID PROCTOSCOPY and DIVERTING LOOP ILEOSTOMY with Dr Malcolm Metro and Dr Johney Maine   04/03/2020 Pathology Results   FINAL MICROSCOPIC DIAGNOSIS:   A. RECTOSIGMOID COLON, LOW ANTERIOR RESECTION:  - No residual invasive carcinoma status post neoadjuvant treatment.  - See oncology table.   B. FINAL DISTAL MARGIN, EXCISION:  - Unremarkable colonic mucosa.    06/12/2020 Surgery   ILEOSTOMY REVERSAL and PAC removal by Dr Marcello Moores     11/26/2020 Imaging   CT C/A/P  IMPRESSION: 1. Changes of prior low anterior resection. Soft tissue nodularity in the presacral space/mesorectal fat, which are nonspecific and may reflect post treatment/surgical changes but disease involvement not excluded. Attention on close interval follow-up exams is recommended. 2. No evidence of metastatic disease within the chest. 3. Similar splenomegaly. 4. Cholelithiasis without evidence of acute cholecystitis. 5. Aortic atherosclerosis.   02/26/2021 Imaging   IMPRESSION: 1. Status post low anterior resection. Nodularity in the presacral space is stable to minimally increased in the interval. Continued close attention recommended as recurrent disease not excluded. 2. Cholelithiasis. 3. Aortic Atherosclerosis (ICD10-I70.0).   03/21/2021 PET scan   IMPRESSION: 1. Small presacral nodule which appears minimally more conspicuous since May 2022, measuring 9 mm today compared to 6 mm than shows low level hypermetabolism. Continued close attention on follow-up recommended as recurrent/metastatic disease is a concern. 2. Hypermetabolic FDG uptake in the anal region likely physiologic. Attention on follow-up recommended. 3. No evidence for distant metastases in the chest, abdomen, or pelvis. 4. Hepatic steatosis. 5. Cholelithiasis. 6.  Aortic Atherosclerois (ICD10-170.0)   04/29/2021 Pathology Results   FINAL MICROSCOPIC DIAGNOSIS:   A. LYMPH NODE, PRE-SACRAL, BIOPSY:  - Reactive lymphoid hyperplasia  - No metastatic carcinoma identified    08/19/2021 Imaging   EXAM: CT ABDOMEN AND PELVIS WITH CONTRAST  IMPRESSION: 1. No new suspicious mass or lymphadenopathy identified in the  abdomen or pelvis. 2. Postsurgical changes of the lower rectum. Decreased size of presacral nodular densities measuring up to 6 mm in short axis. 3. Colonic diverticulosis. 4. Other ancillary findings as described.      INTERVAL HISTORY:  Joshua Zhang is  here for a follow up of rectal cancer. He was last seen by me on 01/12/22. He presents to the clinic alone. He reports he is doing well overall except for recent increase in diarrhea. He attributes it to possibly something he ate. He denies taking anything for this at this time.   All other systems were reviewed with the patient and are negative.  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    Port-A-Cath in place 10/12/2019   rectal ca dx'd 06/2019   Renal insufficiency     SURGICAL HISTORY: Past Surgical History:  Procedure Laterality Date   APPENDECTOMY     51 years old    DIVERTING ILEOSTOMY N/A 04/03/2020   Procedure: DIVERTING LOOP ILEOSTOMY;  Surgeon: Leighton Ruff, MD;  Location: [facility];  Service: General;  Laterality: N/A;   FINGER SURGERY Right    ILEOSTOMY CLOSURE N/A 06/12/2020   Procedure: ILEOSTOMY REVERSAL;  Surgeon: Leighton Ruff, MD;  Location: [facility];  Service: General;  Laterality: N/A;   [place] N/A 09/26/2020   Procedure: LAPAROSCOPIC INCISIONAL HERNIA REPAIR WITH MESH;  Surgeon: Leighton Ruff, MD;  Location: [facility];  Service: General;  Laterality: N/A;   IR IMAGING GUIDED PORT INSERTION  08/30/2019   PORT-A-CATH REMOVAL Right 06/12/2020   Procedure: REMOVAL PORT-A-CATH;  Surgeon: Leighton Ruff, MD;  Location: [facility];  Service: General;  Laterality: Right;   SHOULDER SURGERY Bilateral 51 years old   [place] N/A 04/03/2020   Procedure: XI ROBOTIC ASSISTED LOWER ANTERIOR RESECTION, SPLENIC FLEXURE MOBILIZATION, RIGID PROCTOSCOPY;  Surgeon: Leighton Ruff, MD;  Location: [facility];  Service: General;  Laterality: N/A;    I have reviewed the social history and family history with the patient and they are unchanged from previous  note.  ALLERGIES:  has No Known Allergies.  MEDICATIONS:  Current Outpatient Medications  Medication Sig Dispense Refill   amLODipine (NORVASC) 5 MG tablet Take 5 mg by mouth daily.      atorvastatin (LIPITOR) 10 MG tablet Take 10 mg by mouth daily.      bictegravir-emtricitabine-tenofovir AF (BIKTARVY) 50-200-25 MG TABS tablet TAKE 1 TABLET BY MOUTH DAILY. 30 tablet 11   bictegravir-emtricitabine-tenofovir AF (BIKTARVY) 50-200-25 MG TABS tablet TAKE 1 TABLET BY MOUTH DAILY. 30 tablet 11   losartan (COZAAR) 25 MG tablet Take 25 mg by mouth daily.      Multiple Vitamin (MULTIVITAMIN) tablet Take 1 tablet by mouth daily.     sertraline (ZOLOFT) 50 MG tablet Take 1 tablet (50 mg total) by mouth daily. 30 tablet 2   No current facility-administered medications for this visit.    PHYSICAL EXAMINATION: ECOG PERFORMANCE STATUS: 0 - Asymptomatic  Vitals:   05/14/22 1355  BP: (!) 147/100  Pulse: 75  Temp: 98.1 F (36.7 C)  SpO2: 97%   Wt Readings from Last 3 Encounters:  05/14/22 228 lb 4.8 oz (103.6 kg)  01/12/22 220 lb (99.8 kg)  12/25/21 218 lb (98.9 kg)  GENERAL:alert, no distress and comfortable SKIN: skin color, texture, turgor are normal, no rashes or significant lesions EYES: normal, Conjunctiva are pink and non-injected, sclera clear  NECK: supple, thyroid normal size, non-tender, without nodularity LYMPH:  no palpable lymphadenopathy in the cervical, axillary  LUNGS: clear to auscultation and percussion with normal breathing effort HEART: regular rate & rhythm and no murmurs and no lower extremity edema ABDOMEN:abdomen soft, non-tender and normal bowel sounds Musculoskeletal:no cyanosis of digits and no clubbing  NEURO: alert & oriented x 3 with fluent speech, no focal motor/sensory deficits  LABORATORY DATA:  I have reviewed the data as listed    Latest Ref Rng & Units 05/14/2022    1:01 PM 01/12/2022   12:58 PM 12/11/2021   10:54 AM  CBC  WBC 4.0 - 10.5 K/uL  5.4  5.2  4.5   Hemoglobin 13.0 - 17.0 g/dL 14.1  13.9  14.2   Hematocrit 39.0 - 52.0 % 38.2  37.4  40.9   Platelets 150 - 400 K/uL 201  172  186         Latest Ref Rng & Units 05/14/2022    1:01 PM 01/12/2022   12:58 PM 12/11/2021   10:54 AM  CMP  Glucose 70 - 99 mg/dL 100  98  97   BUN 6 - 20 mg/dL $Remove'15  18  12   'ZxQzPNj$ Creatinine 0.61 - 1.24 mg/dL 1.14  1.01  0.93   Sodium 135 - 145 mmol/L 139  137  139   Potassium 3.5 - 5.1 mmol/L 4.0  3.8  4.0   Chloride 98 - 111 mmol/L 106  103  106   CO2 22 - 32 mmol/L $RemoveB'25  25  23   'zLqwyixI$ Calcium 8.9 - 10.3 mg/dL 9.2  9.1  9.2   Total Protein 6.5 - 8.1 g/dL 6.9  7.0  6.8   Total Bilirubin 0.3 - 1.2 mg/dL 0.8  0.8  0.7   Alkaline Phos 38 - 126 U/L 86  91    AST 15 - 41 U/L 38  38  36   ALT 0 - 44 U/L 59  44  43       RADIOGRAPHIC STUDIES: I have personally reviewed the radiological images as listed and agreed with the findings in the report. No results found.    Orders Placed This Encounter  Procedures   CT CHEST ABDOMEN PELVIS W CONTRAST    Standing Status:   Future    Standing Expiration Date:   05/15/2023    Order Specific Question:   Preferred imaging location?    Answer:   [facility]    Order Specific Question:   Release to patient    Answer:   Immediate    Order Specific Question:   Is Oral Contrast requested for this exam?    Answer:   Yes, Per Radiology protocol   All questions were answered. The patient knows to call the clinic with any problems, questions or concerns. No barriers to learning was detected. The total time spent in the appointment was 25 minutes.     Truitt Merle, MD 05/14/2022   I, Wilburn Mylar, am acting as scribe for Truitt Merle, MD.   I have reviewed the above documentation for accuracy and completeness, and I agree with the above.

## 2022-05-18 ENCOUNTER — Other Ambulatory Visit (HOSPITAL_COMMUNITY): Payer: Self-pay

## 2022-06-09 ENCOUNTER — Other Ambulatory Visit (HOSPITAL_COMMUNITY): Payer: Self-pay

## 2022-06-12 ENCOUNTER — Other Ambulatory Visit (HOSPITAL_COMMUNITY): Payer: Self-pay

## 2022-06-15 ENCOUNTER — Other Ambulatory Visit (HOSPITAL_COMMUNITY): Payer: Self-pay

## 2022-06-17 ENCOUNTER — Other Ambulatory Visit (HOSPITAL_COMMUNITY): Payer: Self-pay

## 2022-06-22 ENCOUNTER — Other Ambulatory Visit (HOSPITAL_COMMUNITY): Payer: Self-pay

## 2022-07-15 ENCOUNTER — Other Ambulatory Visit (HOSPITAL_COMMUNITY): Payer: Self-pay

## 2022-07-16 ENCOUNTER — Other Ambulatory Visit: Payer: Self-pay

## 2022-07-16 ENCOUNTER — Other Ambulatory Visit (HOSPITAL_COMMUNITY): Payer: Self-pay

## 2022-07-16 ENCOUNTER — Encounter: Payer: Self-pay | Admitting: Internal Medicine

## 2022-07-22 ENCOUNTER — Other Ambulatory Visit: Payer: Self-pay

## 2022-08-11 ENCOUNTER — Other Ambulatory Visit (HOSPITAL_COMMUNITY): Payer: Self-pay

## 2022-08-14 ENCOUNTER — Other Ambulatory Visit (HOSPITAL_COMMUNITY): Payer: Self-pay

## 2022-08-17 ENCOUNTER — Other Ambulatory Visit: Payer: Self-pay

## 2022-09-10 ENCOUNTER — Encounter (HOSPITAL_COMMUNITY): Payer: Self-pay

## 2022-09-10 ENCOUNTER — Ambulatory Visit (HOSPITAL_COMMUNITY)
Admission: RE | Admit: 2022-09-10 | Discharge: 2022-09-10 | Disposition: A | Discharge status: Home / Self Care | Payer: BC Managed Care – PPO | Source: Ambulatory Visit | Attending: Hematology | Admitting: Hematology

## 2022-09-10 DIAGNOSIS — N289 Disorder of kidney and ureter, unspecified: Secondary | ICD-10-CM | POA: Insufficient documentation

## 2022-09-10 DIAGNOSIS — C20 Malignant neoplasm of rectum: Secondary | ICD-10-CM | POA: Insufficient documentation

## 2022-09-10 DIAGNOSIS — K5730 Diverticulosis of large intestine without perforation or abscess without bleeding: Secondary | ICD-10-CM | POA: Diagnosis not present

## 2022-09-10 DIAGNOSIS — K8020 Calculus of gallbladder without cholecystitis without obstruction: Secondary | ICD-10-CM | POA: Diagnosis not present

## 2022-09-10 DIAGNOSIS — J984 Other disorders of lung: Secondary | ICD-10-CM | POA: Diagnosis not present

## 2022-09-10 DIAGNOSIS — J8410 Pulmonary fibrosis, unspecified: Secondary | ICD-10-CM | POA: Diagnosis not present

## 2022-09-10 DIAGNOSIS — I700 Atherosclerosis of aorta: Secondary | ICD-10-CM | POA: Diagnosis not present

## 2022-09-10 LAB — POCT I-STAT CREATININE: Creatinine, Ser: 1.1 mg/dL (ref 0.61–1.24)

## 2022-09-10 MED ORDER — IOHEXOL 9 MG/ML PO SOLN
ORAL | Status: AC
  Filled 2022-09-10: qty 1000

## 2022-09-10 MED ORDER — IOHEXOL 300 MG/ML  SOLN
100.0000 mL | Freq: Once | INTRAMUSCULAR | Status: AC | PRN
  Administered 2022-09-10: 100 mL via INTRAVENOUS

## 2022-09-10 MED ORDER — IOHEXOL 9 MG/ML PO SOLN
500.0000 mL | ORAL | Status: AC
  Administered 2022-09-10: 500 mL via ORAL

## 2022-09-10 MED ORDER — SODIUM CHLORIDE (PF) 0.9 % IJ SOLN
INTRAMUSCULAR | Status: AC
  Filled 2022-09-10: qty 50

## 2022-09-11 ENCOUNTER — Other Ambulatory Visit: Payer: Self-pay

## 2022-09-11 ENCOUNTER — Inpatient Hospital Stay: Payer: BC Managed Care – PPO | Attending: Hematology | Admitting: Hematology

## 2022-09-11 DIAGNOSIS — R918 Other nonspecific abnormal finding of lung field: Secondary | ICD-10-CM | POA: Diagnosis not present

## 2022-09-11 NOTE — Progress Notes (Signed)
[place]   [phone] [phone]   Clinic Follow up Note   Patient Care Team: Merwyn Katos as PCP - General (Physician Assistant) Michel Bickers, MD as Consulting Physician (Infectious Diseases) Leighton Ruff, MD as Consulting Physician (General Surgery) Truitt Merle, MD as Consulting Physician (Hematology) Kyung Rudd, MD as Consulting Physician (Radiation Oncology)  Date of Service:  09/11/2022  I connected with Greta Doom on 09/11/2022 at 12:40 PM EST by telephone visit and verified that I am speaking with the correct person using two identifiers.  I discussed the limitations, risks, security and privacy concerns of performing an evaluation and management service by telephone and the availability of in person appointments. I also discussed with the patient that there may be a patient responsible charge related to this service. The patient expressed understanding and agreed to proceed.   Other persons participating in the visit and their role in the encounter:  Wife  Patient's location:  work Provider's location:  Office  CHIEF COMPLAINT: f/u of rectal cancer   CURRENT THERAPY:  Surveillance   ASSESSMENT & PLAN:  Joshua Zhang is a 52 y.o. male with    1. Low rectal adenocarcinoma, cT3N1M0, Stage IIIB, MMR normal, ypT0N0 -low rectum mass found on screening colonoscopy in 06/2019. Biopsy showed moderately differentiated adenocarcinoma. Staged cT3N1 by MRI -s/p 8 cycles neoadjuvant FOLFOX, then concurrent  chemoRT with Xeloda.  -s/p surgery with Dr Marcello Moores on 04/03/20, path showed complete response with no residual carcinoma. Ileostomy takedown 06/12/20 (which caused hernia requiring repair) -surveillance colonoscopy 09/06/20 with Dr. Collene Mares was benign. He will reach out regarding repeat. -most recent CT AP on 08/19/21 showed NED and decrease in size of presacral soft tissue. Plan for one more routine scan in 08/2022. -he  is clinically doing very well. He reports a recent bout of diarrhea, and I encouraged him to use imodium as needed and increase his electrolyte intake. Lab reviewed, overall stable. Physical exam, including rectal, was unremarkable. There is no clinical concern for recurrence.   2. Anxiety -related to cancer diagnosis and concern for recurrence -caused insomnia and increased alcohol use. -I prescribed Zoloft on 02/28/21; he is now on a lower dose. He has established care with Dr. Michail Sermon.   3. HIV, controlled  -He denies h/o or current use of recreational or IV drugs. He is unsure how he initially got infected.  -Controlled and undetectable. 09/15/18 CD4 was 350. His 10/09/19 CD4 T cells is 708  -He is on Biktavy. This is managed by Dr. Megan Salon.     4. Comorbidities: HLD, HTN -He will continue medications and f/u with PCP        PLAN: -discuss CT scan - order Ct chest 11/2022 -f/u in 3 months      SUMMARY OF ONCOLOGIC HISTORY: Oncology History Overview Note  Cancer Staging Rectal adenocarcinoma [facility]) Staging form: Colon and Rectum, AJCC 8th Edition - Clinical stage from 08/21/2019: Stage IIIB (cT3, cN1, cM0) - Signed by Truitt Merle, MD on 08/21/2019 Stage prefix: Initial diagnosis Histologic grade (G): G2 Histologic grading system: 4 grade system    Rectal adenocarcinoma ([place])  07/03/2019 Procedure   Colonoscopy by Dr. Collene Mares  IMPRESSION -Likely Malignant 5cm, circumferential, bleeding tumor in the rectum, 304 cm from the anal verge -biopsy done  -One 8 mm sessile polyp in the distal sigmoid colon, removed with a hot snare x2; resected and retrieved.  -Two small sessile polyps, 1 in the mid-descending colon and 1  in the mid transverse colon - removed by cold biopsies.  -One 74m sessile polyp in the mid transverse colon, removed with a hot snare x1; resected and retrieved.  -One 821msessile polyp in the proximal ascending colon, removed with a hot snare X1; resected and retrieved.   -few large scattered diverticula.   07/03/2019 Initial Biopsy   FINAL DIAGNOSIS 07/03/19  A. Colon, Descending, Polyp, Polypectomy:   -TUBULAR ADENOMA   -No high grade dysplasia or malignancy.  B. Colon, transverse, polyp, polypectomy:   -TUBULAR ADENOMA  -No high grade dysplasia or malignancy. C. Colon, Ascending, Polyp, Polypectomy:   -Fragments of sessile serrated adenoma/polyp D. Colon, sigmoid, polyp, polypectomy:   -Fragments of Tubular Adenoma  -No high grade dysplasia or malignancy. E. Rectum, Mass, Biopsy:   -INVASIVE COLONIC ADENOCARCINOMA, MODERATELY-DIFFERENTIATED, see comment     07/12/2019 Imaging   CT AP W Contrast 07/12/19  IMPRESSION: 1. No evidence of metastatic disease within the chest, abdomen or pelvis. 2. Possible mild wall thickening in the sigmoid colon, although no focal colonic mass is identified. There are few distal colonic diverticula. 3. Sequela of prior granulomatous disease. 4. Possible cholelithiasis with mild wall thickening of the gallbladder fundus. 5. Aortic Atherosclerosis (ICD10-I70.0).   08/21/2019 Initial Diagnosis   [place]al [place]  08/21/2019 Cancer Staging   Staging form: Colon and Rectum, AJCC 8th Edition - Clinical stage from 08/21/2019: Stage IIIB (cT3, cN1, cM0) - Signed by FeTruitt MerleMD on 08/21/2019   08/31/2019 - 12/07/2019 Chemotherapy   FOLFOX q2weeks starting 08/31/19. Removed 5FU bolus and increased steroids pre-meds due to thrombocytopenia and skin rash starting with C4. Completed 12/07/19    11/21/2019 Imaging   CT AP W contrast  IMPRESSION: 1. Stable exam. No evidence for metastatic disease within the abdomen or pelvis. 2. Splenomegaly.  New from previous exam. 3. Gallstones.   Aortic Atherosclerosis (ICD10-I70.0).   12/25/2019 - 02/01/2020 Chemotherapy   Concurrent chemoRT with Xeloda '2000mg'$  in the AM and '1500mg'$  in the PM on days of RT  12/25/19-7/15/2   12/25/2019 - 02/01/2020 Radiation Therapy   Concurrent   chemoRT with Dr MoLisbeth Renshaw/7/21-7/15/21   04/03/2020 Surgery   XI ROBOTIC ASSISTED LOWER ANTERIOR RESECTION, SPLENIC FLEXURE MOBILIZATION, RIGID PROCTOSCOPY and DIVERTING LOOP ILEOSTOMY with Dr ThMalcolm Metrond Dr GrJohney Maine 04/03/2020 Pathology Results   FINAL MICROSCOPIC DIAGNOSIS:   A. RECTOSIGMOID COLON, LOW ANTERIOR RESECTION:  - No residual invasive carcinoma status post neoadjuvant treatment.  - See oncology table.   B. FINAL DISTAL MARGIN, EXCISION:  - Unremarkable colonic mucosa.    06/12/2020 Surgery   ILEOSTOMY REVERSAL and PAC removal by Dr ThMarcello Moores  11/26/2020 Imaging   CT C/A/P  IMPRESSION: 1. Changes of prior low anterior resection. Soft tissue nodularity in the presacral space/mesorectal fat, which are nonspecific and may reflect post treatment/surgical changes but disease involvement not excluded. Attention on close interval follow-up exams is recommended. 2. No evidence of metastatic disease within the chest. 3. Similar splenomegaly. 4. Cholelithiasis without evidence of acute cholecystitis. 5. Aortic atherosclerosis.   02/26/2021 Imaging   IMPRESSION: 1. Status post low anterior resection. Nodularity in the presacral space is stable to minimally increased in the interval. Continued close attention recommended as recurrent disease not excluded. 2. Cholelithiasis. 3. Aortic Atherosclerosis (ICD10-I70.0).   03/21/2021 PET scan   IMPRESSION: 1. Small presacral nodule which appears minimally more conspicuous since May 2022, measuring 9 mm today compared to 6 mm than shows low level hypermetabolism. Continued  close attention on follow-up recommended as recurrent/metastatic disease is a concern. 2. Hypermetabolic FDG uptake in the anal region likely physiologic. Attention on follow-up recommended. 3. No evidence for distant metastases in the chest, abdomen, or pelvis. 4. Hepatic steatosis. 5. Cholelithiasis. 6.  Aortic Atherosclerois (ICD10-170.0)   04/29/2021 Pathology  Results   FINAL MICROSCOPIC DIAGNOSIS:   A. LYMPH NODE, PRE-SACRAL, BIOPSY:  - Reactive lymphoid hyperplasia  - No metastatic carcinoma identified    08/19/2021 Imaging   EXAM: CT ABDOMEN AND PELVIS WITH CONTRAST  IMPRESSION: 1. No new suspicious mass or lymphadenopathy identified in the abdomen or pelvis. 2. Postsurgical changes of the lower rectum. Decreased size of presacral nodular densities measuring up to 6 mm in short axis. 3. Colonic diverticulosis. 4. Other ancillary findings as described.      INTERVAL HISTORY:  DECLEN HOMRICH was contacted for a follow up of rectal cancer . He was last seen by me on 05/14/2022.  Pt state that he had Covid last week. Pt state he is a little fatigue.   All other systems were reviewed with the patient and are negative.  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    Port-A-Cath in place 10/12/2019   rectal ca dx'd 06/2019   Renal insufficiency     SURGICAL HISTORY: Past Surgical History:  Procedure Laterality Date   APPENDECTOMY     52 years old    DIVERTING ILEOSTOMY N/A 04/03/2020   Procedure: DIVERTING LOOP ILEOSTOMY;  Surgeon: Leighton Ruff, MD;  Location: [facility];  Service: General;  Laterality: N/A;   FINGER SURGERY Right    ILEOSTOMY CLOSURE N/A 06/12/2020   Procedure: ILEOSTOMY REVERSAL;  Surgeon: Leighton Ruff, MD;  Location: [facility];  Service: General;  Laterality: N/A;   [place] N/A 09/26/2020   Procedure: LAPAROSCOPIC INCISIONAL HERNIA REPAIR WITH MESH;  Surgeon: Leighton Ruff, MD;  Location: [facility];  Service: General;  Laterality: N/A;   IR IMAGING GUIDED PORT INSERTION  08/30/2019   PORT-A-CATH REMOVAL Right 06/12/2020   Procedure: REMOVAL PORT-A-CATH;  Surgeon: Leighton Ruff, MD;  Location: [facility];  Service: General;  Laterality:  Right;   SHOULDER SURGERY Bilateral 52 years old   [place] N/A 04/03/2020   Procedure: XI ROBOTIC ASSISTED LOWER ANTERIOR RESECTION, SPLENIC FLEXURE MOBILIZATION, RIGID PROCTOSCOPY;  Surgeon: Leighton Ruff, MD;  Location: [facility];  Service: General;  Laterality: N/A;    I have reviewed the social history and family history with the patient and they are unchanged from previous note.  ALLERGIES:  has No Known Allergies.  MEDICATIONS:  Current Outpatient Medications  Medication Sig Dispense Refill   amLODipine (NORVASC) 5 MG tablet Take 5 mg by mouth daily.      atorvastatin (LIPITOR) 10 MG tablet Take 10 mg by mouth daily.      bictegravir-emtricitabine-tenofovir AF (BIKTARVY) 50-200-25 MG TABS tablet TAKE 1 TABLET BY MOUTH DAILY. 30 tablet 11   bictegravir-emtricitabine-tenofovir AF (BIKTARVY) 50-200-25 MG TABS tablet TAKE 1 TABLET BY MOUTH DAILY. 30 tablet 11   losartan (COZAAR) 25 MG tablet Take 25 mg by mouth daily.      Multiple Vitamin (MULTIVITAMIN) tablet Take 1 tablet by mouth daily.     sertraline (ZOLOFT) 50 MG tablet Take  1 tablet (50 mg total) by mouth daily. 30 tablet 2   No current facility-administered medications for this visit.    PHYSICAL EXAMINATION: ECOG PERFORMANCE STATUS: {CHL ONC ECOG PS:[id number]}  There were no vitals filed for this visit. Wt Readings from Last 3 Encounters:  05/14/22 228 lb 4.8 oz (103.6 kg)  01/12/22 220 lb (99.8 kg)  12/25/21 218 lb (98.9 kg)     No vitals taken today, Exam not performed today  LABORATORY DATA:  I have reviewed the data as listed    Latest Ref Rng & Units 05/14/2022    1:01 PM 01/12/2022   12:58 PM 12/11/2021   10:54 AM  CBC  WBC 4.0 - 10.5 K/uL 5.4  5.2  4.5   Hemoglobin 13.0 - 17.0 g/dL 14.1  13.9  14.2   Hematocrit 39.0 - 52.0 % 38.2  37.4  40.9   Platelets 150 - 400 K/uL 201  172  186         Latest Ref Rng & Units 09/10/2022    1:37 PM  05/14/2022    1:01 PM 01/12/2022   12:58 PM  CMP  Glucose 70 - 99 mg/dL  100  98   BUN 6 - 20 mg/dL  15  18   Creatinine 0.61 - 1.24 mg/dL 1.10  1.14  1.01   Sodium 135 - 145 mmol/L  139  137   Potassium 3.5 - 5.1 mmol/L  4.0  3.8   Chloride 98 - 111 mmol/L  106  103   CO2 22 - 32 mmol/L  25  25   Calcium 8.9 - 10.3 mg/dL  9.2  9.1   Total Protein 6.5 - 8.1 g/dL  6.9  7.0   Total Bilirubin 0.3 - 1.2 mg/dL  0.8  0.8   Alkaline Phos 38 - 126 U/L  86  91   AST 15 - 41 U/L  38  38   ALT 0 - 44 U/L  59  44       RADIOGRAPHIC STUDIES: I have personally reviewed the radiological images as listed and agreed with the findings in the report. CT CHEST ABDOMEN PELVIS W CONTRAST  Result Date: 09/10/2022 CLINICAL DATA:  Follow-up rectal carcinoma. Restaging. * Tracking Code: BO * EXAM: CT CHEST, ABDOMEN, AND PELVIS WITH CONTRAST TECHNIQUE: Multidetector CT imaging of the chest, abdomen and pelvis was performed following the standard protocol during bolus administration of intravenous contrast. RADIATION DOSE REDUCTION: This exam was performed according to the departmental dose-optimization program which includes automated exposure control, adjustment of the mA and/or kV according to patient size and/or use of iterative reconstruction technique. CONTRAST:  143m OMNIPAQUE IOHEXOL 300 MG/ML  SOLN COMPARISON:  AP CT on 08/19/2021 FINDINGS: CT CHEST FINDINGS Cardiovascular: No acute findings. Aortic and coronary atherosclerotic calcification incidentally noted. Mediastinum/Lymph Nodes: No masses or pathologically enlarged lymph nodes identified. Lungs/Pleura: 5 mm pulmonary nodule in the anterior left upper lobe on image 37/4 is new since previous study. No other new or enlarging pulmonary nodules or masses are identified. 3 mm nodule in the lateral right lower lobe and small calcified granulomas remain stable. No evidence of infiltrate or pleural effusion. Musculoskeletal:  No suspicious bone lesions  identified. CT ABDOMEN AND PELVIS FINDINGS Hepatobiliary: No masses identified. Tiny calcified gallstones again noted, without evidence of acute cholecystitis or biliary ductal dilatation. Pancreas:  No mass or inflammatory changes. Spleen:  Within normal limits in size and appearance. Adrenals/Urinary tract: No suspicious masses or hydronephrosis. Stomach/Bowel:  Stable post treatment changes in the perirectal and presacral spaces. Two tiny presacral soft tissue nodules measuring up to 5 mm are unchanged. Diverticulosis is seen mainly involving the sigmoid colon, however there is no evidence of diverticulitis. No evidence of obstruction, inflammatory process, or abnormal fluid collections. Vascular/Lymphatic: No pathologically enlarged lymph nodes identified. No acute vascular findings. Aortic atherosclerotic calcification incidentally noted. Reproductive:  No mass or other significant abnormality identified. Other:  None. Musculoskeletal:  No suspicious bone lesions identified. IMPRESSION: No evidence of recurrent or metastatic disease within the abdomen or pelvis. New 5 mm left upper lobe pulmonary nodule, which is indeterminate. Recommend follow-up imaging with chest CT or PET-CT in 3 months. Cholelithiasis. No radiographic evidence of cholecystitis. Colonic diverticulosis. No radiographic evidence of diverticulitis. Aortic Atherosclerosis (ICD10-I70.0). Electronically Signed   By: Marlaine Hind M.D.   On: 09/10/2022 16:32      No orders of the defined types were placed in this encounter.  All questions were answered. The patient knows to call the clinic with any problems, questions or concerns. No barriers to learning was detected. The total time spent in the appointment was {CHL ONC TIME VISIT - [id number].     Baldemar Friday, CMA 09/11/2022   Felicity Coyer am acting as scribe for Truitt Merle, MD.   {Add scribe attestation statement}

## 2022-09-13 ENCOUNTER — Encounter: Payer: Self-pay | Admitting: Hematology

## 2022-09-15 ENCOUNTER — Other Ambulatory Visit (HOSPITAL_COMMUNITY): Payer: Self-pay

## 2022-09-18 ENCOUNTER — Other Ambulatory Visit (HOSPITAL_COMMUNITY): Payer: Self-pay

## 2022-09-23 ENCOUNTER — Encounter: Payer: Self-pay | Admitting: Internal Medicine

## 2022-10-08 ENCOUNTER — Other Ambulatory Visit (HOSPITAL_COMMUNITY): Payer: Self-pay

## 2022-10-12 ENCOUNTER — Other Ambulatory Visit (HOSPITAL_COMMUNITY): Payer: Self-pay

## 2022-10-15 ENCOUNTER — Other Ambulatory Visit: Payer: Self-pay

## 2022-11-12 ENCOUNTER — Other Ambulatory Visit (HOSPITAL_COMMUNITY): Payer: Self-pay

## 2022-11-16 ENCOUNTER — Other Ambulatory Visit (HOSPITAL_COMMUNITY): Payer: Self-pay

## 2022-11-17 ENCOUNTER — Other Ambulatory Visit (HOSPITAL_COMMUNITY): Payer: Self-pay

## 2022-12-07 ENCOUNTER — Inpatient Hospital Stay: Payer: BC Managed Care – PPO | Attending: Hematology

## 2022-12-07 DIAGNOSIS — R918 Other nonspecific abnormal finding of lung field: Secondary | ICD-10-CM | POA: Insufficient documentation

## 2022-12-07 DIAGNOSIS — Z21 Asymptomatic human immunodeficiency virus [HIV] infection status: Secondary | ICD-10-CM | POA: Diagnosis not present

## 2022-12-07 DIAGNOSIS — Z9049 Acquired absence of other specified parts of digestive tract: Secondary | ICD-10-CM | POA: Insufficient documentation

## 2022-12-07 DIAGNOSIS — E785 Hyperlipidemia, unspecified: Secondary | ICD-10-CM | POA: Diagnosis not present

## 2022-12-07 DIAGNOSIS — Z9221 Personal history of antineoplastic chemotherapy: Secondary | ICD-10-CM | POA: Diagnosis not present

## 2022-12-07 DIAGNOSIS — Z79899 Other long term (current) drug therapy: Secondary | ICD-10-CM | POA: Insufficient documentation

## 2022-12-07 DIAGNOSIS — C20 Malignant neoplasm of rectum: Secondary | ICD-10-CM

## 2022-12-07 DIAGNOSIS — I10 Essential (primary) hypertension: Secondary | ICD-10-CM | POA: Diagnosis not present

## 2022-12-07 DIAGNOSIS — I700 Atherosclerosis of aorta: Secondary | ICD-10-CM | POA: Diagnosis not present

## 2022-12-07 DIAGNOSIS — Z85048 Personal history of other malignant neoplasm of rectum, rectosigmoid junction, and anus: Secondary | ICD-10-CM | POA: Insufficient documentation

## 2022-12-07 LAB — CBC WITH DIFFERENTIAL/PLATELET
Abs Immature Granulocytes: 0 10*3/uL (ref 0.00–0.07)
Basophils Absolute: 0 10*3/uL (ref 0.0–0.1)
Basophils Relative: 0 %
Eosinophils Absolute: 0.1 10*3/uL (ref 0.0–0.5)
Eosinophils Relative: 2 %
HCT: 39 % (ref 39.0–52.0)
Hemoglobin: 14.3 g/dL (ref 13.0–17.0)
Immature Granulocytes: 0 %
Lymphocytes Relative: 22 %
Lymphs Abs: 1.3 10*3/uL (ref 0.7–4.0)
MCH: 35.3 pg — ABNORMAL HIGH (ref 26.0–34.0)
MCHC: 36.7 g/dL — ABNORMAL HIGH (ref 30.0–36.0)
MCV: 96.3 fL (ref 80.0–100.0)
Monocytes Absolute: 0.4 10*3/uL (ref 0.1–1.0)
Monocytes Relative: 7 %
Neutro Abs: 4 10*3/uL (ref 1.7–7.7)
Neutrophils Relative %: 69 %
Platelets: 195 10*3/uL (ref 150–400)
RBC: 4.05 MIL/uL — ABNORMAL LOW (ref 4.22–5.81)
RDW: 11.9 % (ref 11.5–15.5)
WBC: 5.8 10*3/uL (ref 4.0–10.5)
nRBC: 0 % (ref 0.0–0.2)

## 2022-12-07 LAB — COMPREHENSIVE METABOLIC PANEL
ALT: 45 U/L — ABNORMAL HIGH (ref 0–44)
AST: 31 U/L (ref 15–41)
Albumin: 4.3 g/dL (ref 3.5–5.0)
Alkaline Phosphatase: 78 U/L (ref 38–126)
Anion gap: 7 (ref 5–15)
BUN: 15 mg/dL (ref 6–20)
CO2: 29 mmol/L (ref 22–32)
Calcium: 9.2 mg/dL (ref 8.9–10.3)
Chloride: 104 mmol/L (ref 98–111)
Creatinine, Ser: 1.02 mg/dL (ref 0.61–1.24)
GFR, Estimated: >60 mL/min (ref >60)
Glucose, Bld: 134 mg/dL — ABNORMAL HIGH (ref 70–99)
Potassium: 3.9 mmol/L (ref 3.5–5.1)
Sodium: 140 mmol/L (ref 135–145)
Total Bilirubin: 0.9 mg/dL (ref 0.3–1.2)
Total Protein: 6.8 g/dL (ref 6.5–8.1)

## 2022-12-08 ENCOUNTER — Ambulatory Visit (HOSPITAL_BASED_OUTPATIENT_CLINIC_OR_DEPARTMENT_OTHER)
Admission: RE | Admit: 2022-12-08 | Discharge: 2022-12-08 | Disposition: A | Discharge status: Home / Self Care | Payer: BC Managed Care – PPO | Source: Ambulatory Visit | Attending: Hematology | Admitting: Hematology

## 2022-12-08 DIAGNOSIS — R918 Other nonspecific abnormal finding of lung field: Secondary | ICD-10-CM

## 2022-12-10 ENCOUNTER — Encounter: Payer: Self-pay | Admitting: Hematology

## 2022-12-10 ENCOUNTER — Other Ambulatory Visit: Payer: Self-pay

## 2022-12-10 ENCOUNTER — Other Ambulatory Visit (HOSPITAL_COMMUNITY): Payer: Self-pay

## 2022-12-10 ENCOUNTER — Inpatient Hospital Stay: Payer: BC Managed Care – PPO | Admitting: Hematology

## 2022-12-10 VITALS — BP 143/89 | HR 74 | Temp 98.1°F | Resp 19 | Wt 224.1 lb

## 2022-12-10 DIAGNOSIS — C20 Malignant neoplasm of rectum: Secondary | ICD-10-CM | POA: Diagnosis not present

## 2022-12-10 DIAGNOSIS — Z85048 Personal history of other malignant neoplasm of rectum, rectosigmoid junction, and anus: Secondary | ICD-10-CM | POA: Diagnosis not present

## 2022-12-10 DIAGNOSIS — B20 Human immunodeficiency virus [HIV] disease: Secondary | ICD-10-CM

## 2022-12-10 DIAGNOSIS — Z79899 Other long term (current) drug therapy: Secondary | ICD-10-CM

## 2022-12-10 DIAGNOSIS — R918 Other nonspecific abnormal finding of lung field: Secondary | ICD-10-CM

## 2022-12-10 DIAGNOSIS — Z113 Encounter for screening for infections with a predominantly sexual mode of transmission: Secondary | ICD-10-CM

## 2022-12-10 NOTE — Assessment & Plan Note (Signed)
cT3N1M0, Stage IIIB, MMR normal, ypT0N0 -low rectum mass found on screening colonoscopy in 06/2019. Biopsy showed moderately differentiated adenocarcinoma. Staged cT3N1 by MRI -s/p 8 cycles neoadjuvant FOLFOX, then concurrent  chemoRT with Xeloda.  -s/p surgery with Dr Maisie Fus on 04/03/20, path showed complete response with no residual carcinoma. Ileostomy takedown 06/12/20 (which caused hernia requiring repair) -surveillance colonoscopy 09/06/20 with Dr. Loreta Ave was benign. He will reach out regarding repeat. -most recent CT AP from September 10, 2022 showed no evidence of cancer recurrence, new 5 mm left upper lobe pulmonary nodule, indeterminate.  I reviewed the images myself and discussed the findings with patient and his wife.  I recommend follow-up of CT chest without contrast in 3 months. -He is clinically doing well, asymptomatic, lab reviewed, no clinical concern for recurrence.

## 2022-12-10 NOTE — Progress Notes (Signed)
[facility]   [phone] [phone]   Clinic Follow up Note   Patient Care Team: Bernita Buffy as PCP - General (Physician Assistant) Cliffton Asters, MD (Inactive) as Consulting Physician (Infectious Diseases) Romie Levee, MD as Consulting Physician (General Surgery) Malachy Mood, MD as Consulting Physician (Hematology) Dorothy Puffer, MD as Consulting Physician (Radiation Oncology)  Date of Service:  12/10/2022  CHIEF COMPLAINT: f/u of rectal cancer   CURRENT THERAPY: Cancer surveillance   ASSESSMENT:  Joshua Zhang is a 52 y.o. male with   Rectal adenocarcinoma (HCC) cT3N1M0, Stage IIIB, MMR normal, ypT0N0 -low rectum mass found on screening colonoscopy in 06/2019. Biopsy showed moderately differentiated adenocarcinoma. Staged cT3N1 by MRI -s/p 8 cycles neoadjuvant FOLFOX, then concurrent  chemoRT with Xeloda.  -s/p surgery with Dr Maisie Fus on 04/03/20, path showed complete response with no residual carcinoma. Ileostomy takedown 06/12/20 (which caused hernia requiring repair) -surveillance colonoscopy 09/06/20 with Dr. Loreta Ave was benign. He will reach out regarding repeat. -most recent CT AP from September 10, 2022 showed no evidence of cancer recurrence, new 5 mm left upper lobe pulmonary nodule, indeterminate.  I reviewed the images myself and discussed the findings with patient and his wife.  I recommend follow-up of CT chest without contrast in 3 months. -He is clinically doing well, asymptomatic, lab reviewed, no clinical concern for recurrence.  Clinical exam including rectal exam were unremarkable.  He is 3-1/2 years out from initial diagnosis, risk of recurrence is much lower now. -I encouraged him to follow-up with Dr. Maisie Fus in 3 months -I will see him back in 6 months  Lung nodules  -He has bilateral small lung nodules, some are calcified, likely benign. -I personally reviewed his CT chest from Dec 08, 2022, and compared to  his previous one 3 months ago.  Overall stable, benign-appearing.  No concern for metastatic disease. -Will repeat CT chest in 6 months.  HIV, controlled  -He denies h/o or current use of recreational or IV drugs. He is unsure how he initially got infected.  -Controlled and undetectable. 09/15/18 CD4 was 350. His 10/09/19 CD4 T cells is 708  -He is on Biktavy. This is managed by Dr. Orvan Falconer.     Comorbidities: HLD, HTN -He will continue medications and f/u with PCP       PLAN: -Lab and CT scan reviewed -I recommend him to follow-up with Dr. Maisie Fus in 3 months -Follow-up with me in 6 months, with lab and a CT chest a few days before.   SUMMARY OF ONCOLOGIC HISTORY: Oncology History Overview Note  Cancer Staging Rectal adenocarcinoma [facility]) Staging form: Colon and Rectum, AJCC 8th Edition - Clinical stage from 08/21/2019: Stage IIIB (cT3, cN1, cM0) - Signed by Malachy Mood, MD on 08/21/2019 Stage prefix: Initial diagnosis Histologic grade (G): G2 Histologic grading system: 4 grade system    Rectal adenocarcinoma (HCC)  07/03/2019 Procedure   Colonoscopy by Dr. Loreta Ave  IMPRESSION -Likely Malignant 5cm, circumferential, bleeding tumor in the rectum, 304 cm from the anal verge -biopsy done  -One 8 mm sessile polyp in the distal sigmoid colon, removed with a hot snare x2; resected and retrieved.  -Two small sessile polyps, 1 in the mid-descending colon and 1 in the mid transverse colon - removed by cold biopsies.  -One 7mm sessile polyp in the mid transverse colon, removed with a hot snare x1; resected and retrieved.  -One 8mm sessile polyp in the proximal ascending colon, removed with a hot snare  X1; resected and retrieved.  -few large scattered diverticula.   07/03/2019 Initial Biopsy   FINAL DIAGNOSIS 07/03/19  A. Colon, Descending, Polyp, Polypectomy:   -TUBULAR ADENOMA   -No high grade dysplasia or malignancy.  B. Colon, transverse, polyp, polypectomy:   -TUBULAR ADENOMA  -No high  grade dysplasia or malignancy. C. Colon, Ascending, Polyp, Polypectomy:   -Fragments of sessile serrated adenoma/polyp D. Colon, sigmoid, polyp, polypectomy:   -Fragments of Tubular Adenoma  -No high grade dysplasia or malignancy. E. Rectum, Mass, Biopsy:   -INVASIVE COLONIC ADENOCARCINOMA, MODERATELY-DIFFERENTIATED, see comment     07/12/2019 Imaging   CT AP W Contrast 07/12/19  IMPRESSION: 1. No evidence of metastatic disease within the chest, abdomen or pelvis. 2. Possible mild wall thickening in the sigmoid colon, although no focal colonic mass is identified. There are few distal colonic diverticula. 3. Sequela of prior granulomatous disease. 4. Possible cholelithiasis with mild wall thickening of the gallbladder fundus. 5. Aortic Atherosclerosis (ICD10-I70.0).   08/21/2019 Initial Diagnosis   Rectal adenocarcinoma (HCC)   08/21/2019 Cancer Staging   Staging form: Colon and Rectum, AJCC 8th Edition - Clinical stage from 08/21/2019: Stage IIIB (cT3, cN1, cM0) - Signed by Malachy Mood, MD on 08/21/2019   08/31/2019 - 12/07/2019 Chemotherapy   FOLFOX q2weeks starting 08/31/19. Removed 5FU bolus and increased steroids pre-meds due to thrombocytopenia and skin rash starting with C4. Completed 12/07/19    11/21/2019 Imaging   CT AP W contrast  IMPRESSION: 1. Stable exam. No evidence for metastatic disease within the abdomen or pelvis. 2. Splenomegaly.  New from previous exam. 3. Gallstones.   Aortic Atherosclerosis (ICD10-I70.0).   12/25/2019 - 02/01/2020 Chemotherapy   Concurrent chemoRT with Xeloda 2000mg  in the AM and 1500mg  in the PM on days of RT  12/25/19-7/15/2   12/25/2019 - 02/01/2020 Radiation Therapy   Concurrent  chemoRT with Dr Mitzi Hansen 12/25/19-02/01/20   04/03/2020 Surgery   XI ROBOTIC ASSISTED LOWER ANTERIOR RESECTION, SPLENIC FLEXURE MOBILIZATION, RIGID PROCTOSCOPY and DIVERTING LOOP ILEOSTOMY with Dr Lang Snow and Dr Michaell Cowing   04/03/2020 Pathology Results   FINAL MICROSCOPIC  DIAGNOSIS:   A. RECTOSIGMOID COLON, LOW ANTERIOR RESECTION:  - No residual invasive carcinoma status post neoadjuvant treatment.  - See oncology table.   B. FINAL DISTAL MARGIN, EXCISION:  - Unremarkable colonic mucosa.    06/12/2020 Surgery   ILEOSTOMY REVERSAL and PAC removal by Dr Maisie Fus    11/26/2020 Imaging   CT C/A/P  IMPRESSION: 1. Changes of prior low anterior resection. Soft tissue nodularity in the presacral space/mesorectal fat, which are nonspecific and may reflect post treatment/surgical changes but disease involvement not excluded. Attention on close interval follow-up exams is recommended. 2. No evidence of metastatic disease within the chest. 3. Similar splenomegaly. 4. Cholelithiasis without evidence of acute cholecystitis. 5. Aortic atherosclerosis.   02/26/2021 Imaging   IMPRESSION: 1. Status post low anterior resection. Nodularity in the presacral space is stable to minimally increased in the interval. Continued close attention recommended as recurrent disease not excluded. 2. Cholelithiasis. 3. Aortic Atherosclerosis (ICD10-I70.0).   03/21/2021 PET scan   IMPRESSION: 1. Small presacral nodule which appears minimally more conspicuous since May 2022, measuring 9 mm today compared to 6 mm than shows low level hypermetabolism. Continued close attention on follow-up recommended as recurrent/metastatic disease is a concern. 2. Hypermetabolic FDG uptake in the anal region likely physiologic. Attention on follow-up recommended. 3. No evidence for distant metastases in the chest, abdomen, or pelvis. 4. Hepatic steatosis. 5. Cholelithiasis. 6.  Aortic Atherosclerois (ICD10-170.0)   04/29/2021 Pathology Results   FINAL MICROSCOPIC DIAGNOSIS:   A. LYMPH NODE, PRE-SACRAL, BIOPSY:  - Reactive lymphoid hyperplasia  - No metastatic carcinoma identified    08/19/2021 Imaging   EXAM: CT ABDOMEN AND PELVIS WITH CONTRAST  IMPRESSION: 1. No new suspicious mass or  lymphadenopathy identified in the abdomen or pelvis. 2. Postsurgical changes of the lower rectum. Decreased size of presacral nodular densities measuring up to 6 mm in short axis. 3. Colonic diverticulosis. 4. Other ancillary findings as described.      INTERVAL HISTORY:  TAVIO MAJKA is here for a follow up of rectal cancer. He was last seen by me on 09/11/2022. He presents to the clinic with his wife.  He is clinically doing well, still has multiple bowel movement a day, no hematochezia, abdominal pain or discomfort.  Appetite and energy level are normal, no weight loss.    All other systems were reviewed with the patient and are negative.  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    Port-A-Cath in place 10/12/2019   rectal ca dx'd 06/2019   Renal insufficiency     SURGICAL HISTORY: Past Surgical History:  Procedure Laterality Date   APPENDECTOMY     52 years old    DIVERTING ILEOSTOMY N/A 04/03/2020   Procedure: DIVERTING LOOP ILEOSTOMY;  Surgeon: Romie Levee, MD;  Location: [facility];  Service: General;  Laterality: N/A;   FINGER SURGERY Right    ILEOSTOMY CLOSURE N/A 06/12/2020   Procedure: ILEOSTOMY REVERSAL;  Surgeon: Romie Levee, MD;  Location: [facility];  Service: General;  Laterality: N/A;   INCISIONAL HERNIA REPAIR N/A 09/26/2020   Procedure: LAPAROSCOPIC INCISIONAL HERNIA REPAIR WITH MESH;  Surgeon: Romie Levee, MD;  Location: [facility];  Service: General;  Laterality: N/A;   IR IMAGING GUIDED PORT INSERTION  08/30/2019   PORT-A-CATH REMOVAL Right 06/12/2020   Procedure: REMOVAL PORT-A-CATH;  Surgeon: Romie Levee, MD;  Location: [facility];  Service: General;  Laterality: Right;   SHOULDER SURGERY Bilateral 52 years old   WISDOM TOOTH EXTRACTION     XI ROBOTIC ASSISTED LOWER ANTERIOR RESECTION N/A  04/03/2020   Procedure: XI ROBOTIC ASSISTED LOWER ANTERIOR RESECTION, SPLENIC FLEXURE MOBILIZATION, RIGID PROCTOSCOPY;  Surgeon: Romie Levee, MD;  Location: [facility];  Service: General;  Laterality: N/A;    I have reviewed the social history and family history with the patient and they are unchanged from previous note.  ALLERGIES:  has No Known Allergies.  MEDICATIONS:  Current Outpatient Medications  Medication Sig Dispense Refill   amLODipine (NORVASC) 5 MG tablet Take 5 mg by mouth daily.      atorvastatin (LIPITOR) 10 MG tablet Take 10 mg by mouth daily.      bictegravir-emtricitabine-tenofovir AF (BIKTARVY) 50-200-25 MG TABS tablet TAKE 1 TABLET BY MOUTH DAILY. 30 tablet 11   bictegravir-emtricitabine-tenofovir AF (BIKTARVY) 50-200-25 MG TABS tablet TAKE 1 TABLET BY MOUTH DAILY. 30 tablet 11   losartan (COZAAR) 25 MG tablet Take 25 mg by mouth daily.      Multiple Vitamin (MULTIVITAMIN) tablet Take 1 tablet by mouth daily.     sertraline (ZOLOFT) 50 MG tablet Take 1 tablet (50 mg total) by mouth daily. 30 tablet 2   No current facility-administered medications for this visit.  PHYSICAL EXAMINATION: ECOG PERFORMANCE STATUS: 0 - Asymptomatic  Vitals:   12/10/22 1221  BP: (!) 143/89  Pulse: 74  Resp: 19  Temp: 98.1 F (36.7 C)  SpO2: 99%   Wt Readings from Last 3 Encounters:  12/10/22 224 lb 1.6 oz (101.7 kg)  05/14/22 228 lb 4.8 oz (103.6 kg)  01/12/22 220 lb (99.8 kg)     GENERAL:alert, no distress and comfortable SKIN: skin color, texture, turgor are normal, no rashes or significant lesions EYES: normal, Conjunctiva are pink and non-injected, sclera clear NECK: supple, thyroid normal size, non-tender, without nodularity LYMPH:  no palpable lymphadenopathy in the cervical, axillary  LUNGS: clear to auscultation and percussion with normal breathing effort HEART: regular rate & rhythm and no murmurs and no lower extremity edema ABDOMEN:abdomen soft, non-tender and  normal bowel sounds Musculoskeletal:no cyanosis of digits and no clubbing  NEURO: alert & oriented x 3 with fluent speech, no focal motor/sensory deficits  LABORATORY DATA:  I have reviewed the data as listed    Latest Ref Rng & Units 12/07/2022   10:53 AM 05/14/2022    1:01 PM 01/12/2022   12:58 PM  CBC  WBC 4.0 - 10.5 K/uL 5.8  5.4  5.2   Hemoglobin 13.0 - 17.0 g/dL 16.1  09.6  04.5   Hematocrit 39.0 - 52.0 % 39.0  38.2  37.4   Platelets 150 - 400 K/uL 195  201  172         Latest Ref Rng & Units 12/07/2022   10:53 AM 09/10/2022    1:37 PM 05/14/2022    1:01 PM  CMP  Glucose 70 - 99 mg/dL 409   811   BUN 6 - 20 mg/dL 15   15   Creatinine 9.14 - 1.24 mg/dL 7.82  9.56  2.13   Sodium 135 - 145 mmol/L 140   139   Potassium 3.5 - 5.1 mmol/L 3.9   4.0   Chloride 98 - 111 mmol/L 104   106   CO2 22 - 32 mmol/L 29   25   Calcium 8.9 - 10.3 mg/dL 9.2   9.2   Total Protein 6.5 - 8.1 g/dL 6.8   6.9   Total Bilirubin 0.3 - 1.2 mg/dL 0.9   0.8   Alkaline Phos 38 - 126 U/L 78   86   AST 15 - 41 U/L 31   38   ALT 0 - 44 U/L 45   59       RADIOGRAPHIC STUDIES: I have personally reviewed the radiological images as listed and agreed with the findings in the report. No results found.    Orders Placed This Encounter  Procedures   CT Chest Wo Contrast    Standing Status:   Future    Standing Expiration Date:   12/10/2023    Order Specific Question:   Preferred imaging location?    Answer:   [facility]   All questions were answered. The patient knows to call the clinic with any problems, questions or concerns. No barriers to learning was detected. The total time spent in the appointment was 25 minutes.     Malachy Mood, MD 12/10/2022   Carolin Coy, CMA, am acting as scribe for Malachy Mood, MD.   I have reviewed the above documentation for accuracy and completeness, and I agree with the above.

## 2022-12-11 ENCOUNTER — Other Ambulatory Visit (HOSPITAL_COMMUNITY): Payer: Self-pay

## 2022-12-11 ENCOUNTER — Other Ambulatory Visit: Payer: Self-pay

## 2022-12-11 ENCOUNTER — Other Ambulatory Visit (HOSPITAL_BASED_OUTPATIENT_CLINIC_OR_DEPARTMENT_OTHER): Payer: Self-pay

## 2022-12-11 ENCOUNTER — Other Ambulatory Visit: Payer: Self-pay | Admitting: Internal Medicine

## 2022-12-11 DIAGNOSIS — B20 Human immunodeficiency virus [HIV] disease: Secondary | ICD-10-CM

## 2022-12-11 MED ORDER — BIKTARVY 50-200-25 MG PO TABS
1.0000 | ORAL_TABLET | Freq: Every day | ORAL | 0 refills | Status: DC
  Filled 2022-12-11: qty 30, 30d supply, fill #0

## 2022-12-15 ENCOUNTER — Other Ambulatory Visit: Payer: BC Managed Care – PPO

## 2022-12-15 ENCOUNTER — Other Ambulatory Visit (HOSPITAL_COMMUNITY): Payer: Self-pay

## 2022-12-15 ENCOUNTER — Other Ambulatory Visit: Payer: Self-pay

## 2022-12-15 DIAGNOSIS — C20 Malignant neoplasm of rectum: Secondary | ICD-10-CM

## 2022-12-15 DIAGNOSIS — B20 Human immunodeficiency virus [HIV] disease: Secondary | ICD-10-CM

## 2022-12-15 DIAGNOSIS — Z79899 Other long term (current) drug therapy: Secondary | ICD-10-CM

## 2022-12-15 DIAGNOSIS — Z113 Encounter for screening for infections with a predominantly sexual mode of transmission: Secondary | ICD-10-CM

## 2022-12-17 LAB — T-HELPER CELL (CD4) - (RCID CLINIC ONLY)
CD4 % Helper T Cell: 31 % — ABNORMAL LOW (ref 33–65)
CD4 T Cell Abs: 291 /uL — ABNORMAL LOW (ref 400–1790)

## 2022-12-18 LAB — COMPLETE METABOLIC PANEL WITH GFR
AG Ratio: 2 (calc) (ref 1.0–2.5)
ALT: 38 U/L (ref 9–46)
AST: 29 U/L (ref 10–35)
Albumin: 4.2 g/dL (ref 3.6–5.1)
Alkaline phosphatase (APISO): 76 U/L (ref 35–144)
BUN: 15 mg/dL (ref 7–25)
CO2: 25 mmol/L (ref 20–32)
Calcium: 9.3 mg/dL (ref 8.6–10.3)
Chloride: 106 mmol/L (ref 98–110)
Creat: 1.03 mg/dL (ref 0.70–1.30)
Globulin: 2.1 g/dL (calc) (ref 1.9–3.7)
Glucose, Bld: 106 mg/dL — ABNORMAL HIGH (ref 65–99)
Potassium: 4 mmol/L (ref 3.5–5.3)
Sodium: 139 mmol/L (ref 135–146)
Total Bilirubin: 0.7 mg/dL (ref 0.2–1.2)
Total Protein: 6.3 g/dL (ref 6.1–8.1)
eGFR: 87 mL/min/{1.73_m2} (ref >=60)

## 2022-12-18 LAB — LIPID PANEL
Cholesterol: 224 mg/dL — ABNORMAL HIGH (ref <200)
HDL: 47 mg/dL (ref >=40)
LDL Cholesterol (Calc): 127 mg/dL (calc) — ABNORMAL HIGH
Non-HDL Cholesterol (Calc): 177 mg/dL (calc) — ABNORMAL HIGH (ref <130)
Total CHOL/HDL Ratio: 4.8 (calc) (ref <5.0)
Triglycerides: 351 mg/dL — ABNORMAL HIGH (ref <150)

## 2022-12-18 LAB — HIV-1 RNA QUANT-NO REFLEX-BLD
HIV 1 RNA Quant: <20 Copies/mL — ABNORMAL HIGH
HIV-1 RNA Quant, Log: <1.3 Log cps/mL — ABNORMAL HIGH

## 2022-12-18 LAB — RPR: RPR Ser Ql: NONREACTIVE

## 2022-12-29 ENCOUNTER — Ambulatory Visit: Payer: BC Managed Care – PPO | Admitting: Internal Medicine

## 2022-12-29 ENCOUNTER — Encounter: Payer: Self-pay | Admitting: Internal Medicine

## 2022-12-29 ENCOUNTER — Other Ambulatory Visit: Payer: Self-pay

## 2022-12-29 VITALS — BP 131/90 | HR 86 | Temp 97.8°F | Wt 223.0 lb

## 2022-12-29 DIAGNOSIS — Z125 Encounter for screening for malignant neoplasm of prostate: Secondary | ICD-10-CM | POA: Diagnosis not present

## 2022-12-29 DIAGNOSIS — C189 Malignant neoplasm of colon, unspecified: Secondary | ICD-10-CM | POA: Diagnosis not present

## 2022-12-29 DIAGNOSIS — B20 Human immunodeficiency virus [HIV] disease: Secondary | ICD-10-CM

## 2022-12-29 NOTE — Patient Instructions (Addendum)
You are doing very well   These are 2 vaccines you could do within the next year or so Pneumonia Tetanus booster   For your medication, continue biktarvy   With regard to heart/stroke risk reduction, I do recommend increasing your lipitor to 40 mg instead of the current 10 mg you are taking.     See me in 6 months again and can do labs during clinic visit

## 2022-12-29 NOTE — Progress Notes (Signed)
Patient Active Problem List   Diagnosis Date Noted   Status post reversal of ileostomy 06/14/2020   Hyperglycemia 06/14/2020   Neuropathy    Hypertension    History of chemotherapy    Ileostomy present (HCC) 06/12/2020   Impaired fasting glucose 02/02/2020   Rectal adenocarcinoma (HCC) 08/21/2019   Injury of tendon of triceps 03/03/2019   Mixed hyperlipidemia 07/26/2018   Low back pain 09/29/2017   Essential hypertension 04/01/2017   Renal insufficiency 03/31/2017   Dyslipidemia 03/31/2017   HIV disease (HCC) 11/11/2016   Tinea versicolor 04/22/2016    Patient's Medications  New Prescriptions   No medications on file  Previous Medications   AMLODIPINE (NORVASC) 5 MG TABLET    Take 5 mg by mouth daily.    ATORVASTATIN (LIPITOR) 10 MG TABLET    Take 10 mg by mouth daily.    BICTEGRAVIR-EMTRICITABINE-TENOFOVIR AF (BIKTARVY) 50-200-25 MG TABS TABLET    TAKE 1 TABLET BY MOUTH DAILY.   LOSARTAN (COZAAR) 25 MG TABLET    Take 25 mg by mouth daily.    MULTIPLE VITAMIN (MULTIVITAMIN) TABLET    Take 1 tablet by mouth daily.   SERTRALINE (ZOLOFT) 50 MG TABLET    Take 1 tablet (50 mg total) by mouth daily.  Modified Medications   No medications on file  Discontinued Medications   No medications on file    Subjective: Joshua Zhang is in for his routine HIV (heterosexual) follow-up visit.  He has been seen by dr Orvan Falconer previously   12/29/22 id clinic f/u First visit with me Taking biktarvy/compliant No complaint today Hx rectal cancer stage ct3n1, sp 8 cycles neoadjuvant folfox RT, s/p surgery 03/2020 clear margin, no residual cancer and ileostomy take down done 05/2020 -- routine f/u ct 08/2022 no evidence disease. 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 He was from [place] originally. Not previously to [place], but been to [place], [place], and all over the [place].  Review of Systems: Review of Systems  Constitutional:  Negative for fever and weight loss.  Psychiatric/Behavioral:  Negative for depression.     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    Port-A-Cath in place 10/12/2019   rectal ca dx'd 06/2019   Renal insufficiency     Social History   Tobacco Use   Smoking status: Former    Packs/day: 0.25    Years: 5.00    Additional pack years: 0.00    Total pack years: 1.25    Types: Cigarettes    Quit date: 2003    Years since quitting: 21.4   Smokeless tobacco: Never   Tobacco comments:    Quit 18 years ago  Vaping Use   Vaping Use: Never used  Substance Use Topics   Alcohol use: Yes    Comment: 5-6 driniks per week    Drug use: Not Currently    Family History  Problem Relation Age of Onset   Breast cancer  Mother    Aortic aneurysm Father     No Known Allergies  Health Maintenance  Topic Date Due   COVID-19 Vaccine (4 - 2023-24 season) 03/20/2022   DTaP/Tdap/Td (2 - Td or Tdap) 10/22/2022   INFLUENZA VACCINE  02/18/2023   Colonoscopy  09/06/2030   Hepatitis C Screening  Completed   HIV Screening  Completed   Zoster Vaccines- Shingrix  Completed   HPV VACCINES  Aged Out    Objective:  Vitals:   12/29/22 1109  BP: (!) 131/90  Pulse: 86  Temp: 97.8 F (36.6 C)  TempSrc: Oral  SpO2: 99%  Weight: 223 lb (101.2 kg)   Body mass index is 31.1 kg/m.  Physical Exam Constitutional:      Comments: He is in good spirits.  Cardiovascular:     Rate and Rhythm: Normal rate and regular rhythm.     Heart sounds: No murmur heard. Pulmonary:     Effort: Pulmonary effort is  normal.     Breath sounds: Normal breath sounds.  Psychiatric:        Mood and Affect: Mood normal.     Lab Results Lab Results  Component Value Date   WBC 5.8 12/07/2022   HGB 14.3 12/07/2022   HCT 39.0 12/07/2022   MCV 96.3 12/07/2022   PLT 195 12/07/2022    Lab Results  Component Value Date   CREATININE 1.03 12/15/2022   BUN 15 12/15/2022   NA 139 12/15/2022   K 4.0 12/15/2022   CL 106 12/15/2022   CO2 25 12/15/2022    Lab Results  Component Value Date   ALT 38 12/15/2022   AST 29 12/15/2022   ALKPHOS 78 12/07/2022   BILITOT 0.7 12/15/2022    Lab Results  Component Value Date   CHOL 224 (H) 12/15/2022   HDL 47 12/15/2022   LDLCALC 127 (H) 12/15/2022   TRIG 351 (H) 12/15/2022   CHOLHDL 4.8 12/15/2022   Lab Results  Component Value Date   LABRPR NON-REACTIVE 12/15/2022   HIV 1 RNA Quant  Date Value  12/15/2022 <20 Copies/mL (H)  12/11/2021 NOT DETECTED copies/mL  12/24/2020 Not Detected Copies/mL   CD4 T Cell Abs (/uL)  Date Value  12/15/2022 291 (L)  12/11/2021 376 (L)  12/24/2020 248 (L)     Problem List Items Addressed This Visit       Other   HIV disease (HCC)   Other Visit Diagnoses     Prostate cancer screening    -  Primary   Relevant Orders   PSA, total and free   Malignant neoplasm of colon, unspecified part of colon (HCC)           #hiv Doing well on biktarvy    -discussed u=u -encourage compliance -continue current HIV medication -labs 6 months during f/u -f/u in 6 months    #colon cancer No evidence disease F/u oncology   #prostate cancer screening Psa ordered and he'll do in a few weeks Advise getting pcp to f/u with this   #hcm -vaccination Due conjugate pneumonococcal vaccine and tetanus booster -hepatitis Will review -std Deferred -cancer screening Psa ordered     Raymondo Band, MD [facility] [phone] pager   [phone]  cell 12/29/2022, 11:20 AM

## 2023-01-08 ENCOUNTER — Other Ambulatory Visit: Payer: Self-pay

## 2023-01-08 ENCOUNTER — Other Ambulatory Visit (HOSPITAL_COMMUNITY): Payer: Self-pay

## 2023-01-08 ENCOUNTER — Other Ambulatory Visit: Payer: Self-pay | Admitting: Internal Medicine

## 2023-01-08 DIAGNOSIS — B20 Human immunodeficiency virus [HIV] disease: Secondary | ICD-10-CM

## 2023-01-08 MED ORDER — BIKTARVY 50-200-25 MG PO TABS
1.0000 | ORAL_TABLET | Freq: Every day | ORAL | 5 refills | Status: DC
  Filled 2023-01-08: qty 30, 30d supply, fill #0
  Filled 2023-02-10: qty 30, 30d supply, fill #1
  Filled 2023-03-11: qty 30, 30d supply, fill #2
  Filled 2023-04-09: qty 30, 30d supply, fill #3
  Filled 2023-05-12: qty 30, 30d supply, fill #4
  Filled 2023-06-10: qty 30, 30d supply, fill #5

## 2023-01-11 ENCOUNTER — Other Ambulatory Visit (HOSPITAL_COMMUNITY): Payer: Self-pay

## 2023-02-09 ENCOUNTER — Other Ambulatory Visit (HOSPITAL_COMMUNITY): Payer: Self-pay

## 2023-02-10 ENCOUNTER — Other Ambulatory Visit (HOSPITAL_COMMUNITY): Payer: Self-pay

## 2023-02-16 ENCOUNTER — Encounter: Payer: Self-pay | Admitting: Internal Medicine

## 2023-02-16 ENCOUNTER — Other Ambulatory Visit: Payer: Self-pay

## 2023-02-16 NOTE — Telephone Encounter (Signed)
No interactions

## 2023-03-11 ENCOUNTER — Other Ambulatory Visit (HOSPITAL_COMMUNITY): Payer: Self-pay

## 2023-03-15 ENCOUNTER — Other Ambulatory Visit (HOSPITAL_COMMUNITY): Payer: Self-pay

## 2023-04-02 ENCOUNTER — Other Ambulatory Visit (HOSPITAL_COMMUNITY): Payer: Self-pay

## 2023-04-06 ENCOUNTER — Other Ambulatory Visit (HOSPITAL_COMMUNITY): Payer: Self-pay

## 2023-04-09 ENCOUNTER — Other Ambulatory Visit (HOSPITAL_COMMUNITY): Payer: Self-pay

## 2023-04-09 ENCOUNTER — Other Ambulatory Visit: Payer: Self-pay

## 2023-05-12 ENCOUNTER — Other Ambulatory Visit: Payer: Self-pay

## 2023-05-12 NOTE — Progress Notes (Signed)
Specialty Pharmacy Refill Coordination Note  CASS RILL is a 52 y.o. male contacted today regarding refills of specialty medication(s) Bictegravir-Emtricitab-Tenofov   Patient requested Delivery   Delivery date: 05/17/23   Verified address: [address]  [place] [address]   Medication will be filled on 05/14/23.

## 2023-05-14 ENCOUNTER — Other Ambulatory Visit (HOSPITAL_COMMUNITY): Payer: Self-pay

## 2023-06-03 ENCOUNTER — Other Ambulatory Visit: Payer: Self-pay

## 2023-06-03 ENCOUNTER — Inpatient Hospital Stay: Payer: BC Managed Care – PPO | Attending: Hematology

## 2023-06-03 ENCOUNTER — Ambulatory Visit (HOSPITAL_COMMUNITY)
Admission: RE | Admit: 2023-06-03 | Discharge: 2023-06-03 | Disposition: A | Discharge status: Home / Self Care | Payer: BC Managed Care – PPO | Source: Ambulatory Visit | Attending: Hematology | Admitting: Hematology

## 2023-06-03 DIAGNOSIS — I700 Atherosclerosis of aorta: Secondary | ICD-10-CM | POA: Insufficient documentation

## 2023-06-03 DIAGNOSIS — K5730 Diverticulosis of large intestine without perforation or abscess without bleeding: Secondary | ICD-10-CM | POA: Insufficient documentation

## 2023-06-03 DIAGNOSIS — Z79624 Long term (current) use of inhibitors of nucleotide synthesis: Secondary | ICD-10-CM | POA: Insufficient documentation

## 2023-06-03 DIAGNOSIS — C20 Malignant neoplasm of rectum: Secondary | ICD-10-CM

## 2023-06-03 DIAGNOSIS — K8020 Calculus of gallbladder without cholecystitis without obstruction: Secondary | ICD-10-CM | POA: Diagnosis not present

## 2023-06-03 DIAGNOSIS — R918 Other nonspecific abnormal finding of lung field: Secondary | ICD-10-CM | POA: Insufficient documentation

## 2023-06-03 DIAGNOSIS — D122 Benign neoplasm of ascending colon: Secondary | ICD-10-CM | POA: Insufficient documentation

## 2023-06-03 DIAGNOSIS — Z85038 Personal history of other malignant neoplasm of large intestine: Secondary | ICD-10-CM | POA: Diagnosis not present

## 2023-06-03 DIAGNOSIS — Z9221 Personal history of antineoplastic chemotherapy: Secondary | ICD-10-CM | POA: Insufficient documentation

## 2023-06-03 DIAGNOSIS — K760 Fatty (change of) liver, not elsewhere classified: Secondary | ICD-10-CM | POA: Insufficient documentation

## 2023-06-03 DIAGNOSIS — Z79899 Other long term (current) drug therapy: Secondary | ICD-10-CM | POA: Insufficient documentation

## 2023-06-03 DIAGNOSIS — R45851 Suicidal ideations: Secondary | ICD-10-CM | POA: Insufficient documentation

## 2023-06-03 DIAGNOSIS — D123 Benign neoplasm of transverse colon: Secondary | ICD-10-CM | POA: Insufficient documentation

## 2023-06-03 DIAGNOSIS — Z9049 Acquired absence of other specified parts of digestive tract: Secondary | ICD-10-CM | POA: Insufficient documentation

## 2023-06-03 DIAGNOSIS — D125 Benign neoplasm of sigmoid colon: Secondary | ICD-10-CM | POA: Insufficient documentation

## 2023-06-03 DIAGNOSIS — Z87891 Personal history of nicotine dependence: Secondary | ICD-10-CM | POA: Insufficient documentation

## 2023-06-03 DIAGNOSIS — D124 Benign neoplasm of descending colon: Secondary | ICD-10-CM | POA: Insufficient documentation

## 2023-06-03 LAB — CMP (CANCER CENTER ONLY)
ALT: 29 U/L (ref 0–44)
AST: 25 U/L (ref 15–41)
Albumin: 4.2 g/dL (ref 3.5–5.0)
Alkaline Phosphatase: 77 U/L (ref 38–126)
Anion gap: 6 (ref 5–15)
BUN: 9 mg/dL (ref 6–20)
CO2: 26 mmol/L (ref 22–32)
Calcium: 9.2 mg/dL (ref 8.9–10.3)
Chloride: 108 mmol/L (ref 98–111)
Creatinine: 1.05 mg/dL (ref 0.61–1.24)
GFR, Estimated: >60 mL/min (ref >60)
Glucose, Bld: 97 mg/dL (ref 70–99)
Potassium: 4 mmol/L (ref 3.5–5.1)
Sodium: 140 mmol/L (ref 135–145)
Total Bilirubin: 1 mg/dL (ref <1.2)
Total Protein: 6.8 g/dL (ref 6.5–8.1)

## 2023-06-03 LAB — CBC WITH DIFFERENTIAL (CANCER CENTER ONLY)
Abs Immature Granulocytes: 0.01 10*3/uL (ref 0.00–0.07)
Basophils Absolute: 0 10*3/uL (ref 0.0–0.1)
Basophils Relative: 0 %
Eosinophils Absolute: 0.1 10*3/uL (ref 0.0–0.5)
Eosinophils Relative: 3 %
HCT: 46.3 % (ref 39.0–52.0)
Hemoglobin: 16.8 g/dL (ref 13.0–17.0)
Immature Granulocytes: 0 %
Lymphocytes Relative: 22 %
Lymphs Abs: 1 10*3/uL (ref 0.7–4.0)
MCH: 34.7 pg — ABNORMAL HIGH (ref 26.0–34.0)
MCHC: 36.3 g/dL — ABNORMAL HIGH (ref 30.0–36.0)
MCV: 95.7 fL (ref 80.0–100.0)
Monocytes Absolute: 0.4 10*3/uL (ref 0.1–1.0)
Monocytes Relative: 9 %
Neutro Abs: 3.1 10*3/uL (ref 1.7–7.7)
Neutrophils Relative %: 66 %
Platelet Count: 201 10*3/uL (ref 150–400)
RBC: 4.84 MIL/uL (ref 4.22–5.81)
RDW: 12 % (ref 11.5–15.5)
WBC Count: 4.7 10*3/uL (ref 4.0–10.5)
nRBC: 0 % (ref 0.0–0.2)

## 2023-06-03 LAB — CEA (ACCESS): CEA (CHCC): 1.28 ng/mL (ref 0.00–5.00)

## 2023-06-06 NOTE — Assessment & Plan Note (Signed)
cT3N1M0, Stage IIIB, MMR normal, ypT0N0 -low rectum mass found on screening colonoscopy in 06/2019. Biopsy showed moderately differentiated adenocarcinoma. Staged cT3N1 by MRI -s/p 8 cycles neoadjuvant FOLFOX, then concurrent  chemoRT with Xeloda.  -s/p surgery with Dr Maisie Fus on 04/03/20, path showed complete response with no residual carcinoma. Ileostomy takedown 06/12/20 (which caused hernia requiring repair) -surveillance colonoscopy 09/06/20 with Dr. Loreta Ave was benign. He will reach out regarding repeat. -most recent CT AP from September 10, 2022 showed no evidence of cancer recurrence, new 5 mm left upper lobe pulmonary nodule, indeterminate.   -follow up CT chest on 05/31/2023 showed multiple stable lung nodules except the RUL nodule is slightly more prominent  -He is clinically doing well, asymptomatic, lab reviewed, no clinical concern for recurrence.

## 2023-06-07 ENCOUNTER — Encounter: Payer: Self-pay | Admitting: Hematology

## 2023-06-07 ENCOUNTER — Inpatient Hospital Stay: Payer: BC Managed Care – PPO | Admitting: Hematology

## 2023-06-07 ENCOUNTER — Other Ambulatory Visit: Payer: Self-pay

## 2023-06-07 VITALS — BP 140/85 | HR 89 | Temp 98.4°F | Resp 18 | Wt 214.4 lb

## 2023-06-07 DIAGNOSIS — C20 Malignant neoplasm of rectum: Secondary | ICD-10-CM | POA: Diagnosis not present

## 2023-06-07 DIAGNOSIS — D125 Benign neoplasm of sigmoid colon: Secondary | ICD-10-CM | POA: Diagnosis not present

## 2023-06-07 DIAGNOSIS — I700 Atherosclerosis of aorta: Secondary | ICD-10-CM | POA: Diagnosis not present

## 2023-06-07 DIAGNOSIS — D122 Benign neoplasm of ascending colon: Secondary | ICD-10-CM | POA: Diagnosis not present

## 2023-06-07 DIAGNOSIS — Z9049 Acquired absence of other specified parts of digestive tract: Secondary | ICD-10-CM | POA: Diagnosis not present

## 2023-06-07 DIAGNOSIS — D123 Benign neoplasm of transverse colon: Secondary | ICD-10-CM | POA: Diagnosis not present

## 2023-06-07 DIAGNOSIS — K760 Fatty (change of) liver, not elsewhere classified: Secondary | ICD-10-CM | POA: Diagnosis not present

## 2023-06-07 DIAGNOSIS — R918 Other nonspecific abnormal finding of lung field: Secondary | ICD-10-CM | POA: Diagnosis not present

## 2023-06-07 DIAGNOSIS — K5730 Diverticulosis of large intestine without perforation or abscess without bleeding: Secondary | ICD-10-CM | POA: Diagnosis not present

## 2023-06-07 DIAGNOSIS — Z79899 Other long term (current) drug therapy: Secondary | ICD-10-CM | POA: Diagnosis not present

## 2023-06-07 DIAGNOSIS — Z79624 Long term (current) use of inhibitors of nucleotide synthesis: Secondary | ICD-10-CM | POA: Diagnosis not present

## 2023-06-07 DIAGNOSIS — R45851 Suicidal ideations: Secondary | ICD-10-CM | POA: Diagnosis not present

## 2023-06-07 DIAGNOSIS — D124 Benign neoplasm of descending colon: Secondary | ICD-10-CM | POA: Diagnosis not present

## 2023-06-07 DIAGNOSIS — Z87891 Personal history of nicotine dependence: Secondary | ICD-10-CM | POA: Diagnosis not present

## 2023-06-07 DIAGNOSIS — Z9221 Personal history of antineoplastic chemotherapy: Secondary | ICD-10-CM | POA: Diagnosis not present

## 2023-06-07 NOTE — Progress Notes (Signed)
[facility]   [phone] Fax:([phone]) [phone]   Clinic Follow up Note   Patient Care Team: Bernita Buffy as PCP - General (Physician Assistant) Cliffton Asters, MD (Inactive) as Consulting Physician (Infectious Diseases) Romie Levee, MD as Consulting Physician (General Surgery) Malachy Mood, MD as Consulting Physician (Hematology) Dorothy Puffer, MD as Consulting Physician (Radiation Oncology)  Date of Service:  06/07/2023  CHIEF COMPLAINT: f/u of rectal cancer  CURRENT THERAPY:  Cancer surveillance  Oncology History   Rectal adenocarcinoma (HCC) cT3N1M0, Stage IIIB, MMR normal, ypT0N0 -low rectum mass found on screening colonoscopy in 06/2019. Biopsy showed moderately differentiated adenocarcinoma. Staged cT3N1 by MRI -s/p 8 cycles neoadjuvant FOLFOX, then concurrent  chemoRT with Xeloda.  -s/p surgery with Dr Maisie Fus on 04/03/20, path showed complete response with no residual carcinoma. Ileostomy takedown 06/12/20 (which caused hernia requiring repair) -surveillance colonoscopy 09/06/20 with Dr. Loreta Ave was benign. He will reach out regarding repeat. -most recent CT AP from September 10, 2022 showed no evidence of cancer recurrence, new 5 mm left upper lobe pulmonary nodule, indeterminate.   -follow up CT chest on 05/31/2023 showed multiple stable lung nodules except the RUL nodule is slightly more prominent  -He is clinically doing well, asymptomatic, lab reviewed, no clinical concern for recurrence.   Assessment and Plan    Rectal cancer 52 year old with a history of rectal cancer diagnosed four years ago. Currently asymptomatic. Recent CT scan shows stable lung nodules with one lymph node appearing slightly more solid. Differential diagnosis includes benign causes such as inflammation, infection, or scar tissue, but malignancy cannot be completely ruled out due to history of cancer and HIV. However, the slow growth and small size of the nodules  make malignancy less likely. Discussed the need for further evaluation by a pulmonologist to determine if bronchoscopy or biopsy is necessary. Patient agreed to referral. - Refer to pulmonologist for further evaluation of lung nodules - Schedule follow-up visit in one year which will be his last visit  -He is probably due for colonoscopy, he will contact Dr. Loreta Ave to check it   Lung Nodules Small lung nodules identified on CT scan since 2022, with one appearing slightly more solid. Differential diagnosis includes benign causes such as inflammation, infection, or scar tissue, but malignancy cannot be completely ruled out. Given the patient's history of colon cancer and HIV, further evaluation is warranted. Discussed the potential need for bronchoscopy or biopsy and the possibility of monitoring with high-resolution CT. - Refer to pulmonologist for potential bronchoscopy or biopsy - Monitor with high-resolution CT if recommended by pulmonologist  HIV HIV, which increases the risk of lung infections and inflammation, potentially contributing to lung nodules. Discussed the increased risk of lung infections and the need for continued monitoring. - Continue current HIV management and medications  Hypertension Hypertension, currently managed with medication. Blood pressure is still slightly elevated. Discussed the importance of home monitoring and adherence to medication. - Continue current antihypertensive medication - Monitor blood pressure at home  General Health Maintenance and /u  Routine health maintenance discussed, including the need for regular colonoscopies. Last colonoscopy was in February 2022, and another is due.   Plan - Schedule final lab and follow-up visit in one year.    -Referred to pulmonary clinic for his small lung nodules.     SUMMARY OF ONCOLOGIC HISTORY: Oncology History Overview Note  Cancer Staging Rectal adenocarcinoma [facility]) Staging form: Colon and Rectum, AJCC  8th Edition - Clinical stage from 08/21/2019: Stage  IIIB (cT3, cN1, cM0) - Signed by Malachy Mood, MD on 08/21/2019 Stage prefix: Initial diagnosis Histologic grade (G): G2 Histologic grading system: 4 grade system    Rectal adenocarcinoma (HCC)  07/03/2019 Procedure   Colonoscopy by Dr. Loreta Ave  IMPRESSION -Likely Malignant 5cm, circumferential, bleeding tumor in the rectum, 304 cm from the anal verge -biopsy done  -One 8 mm sessile polyp in the distal sigmoid colon, removed with a hot snare x2; resected and retrieved.  -Two small sessile polyps, 1 in the mid-descending colon and 1 in the mid transverse colon - removed by cold biopsies.  -One 7mm sessile polyp in the mid transverse colon, removed with a hot snare x1; resected and retrieved.  -One 8mm sessile polyp in the proximal ascending colon, removed with a hot snare X1; resected and retrieved.  -few large scattered diverticula.   07/03/2019 Initial Biopsy   FINAL DIAGNOSIS 07/03/19  A. Colon, Descending, Polyp, Polypectomy:   -TUBULAR ADENOMA   -No high grade dysplasia or malignancy.  B. Colon, transverse, polyp, polypectomy:   -TUBULAR ADENOMA  -No high grade dysplasia or malignancy. C. Colon, Ascending, Polyp, Polypectomy:   -Fragments of sessile serrated adenoma/polyp D. Colon, sigmoid, polyp, polypectomy:   -Fragments of Tubular Adenoma  -No high grade dysplasia or malignancy. E. Rectum, Mass, Biopsy:   -INVASIVE COLONIC ADENOCARCINOMA, MODERATELY-DIFFERENTIATED, see comment     07/12/2019 Imaging   CT AP W Contrast 07/12/19  IMPRESSION: 1. No evidence of metastatic disease within the chest, abdomen or pelvis. 2. Possible mild wall thickening in the sigmoid colon, although no focal colonic mass is identified. There are few distal colonic diverticula. 3. Sequela of prior granulomatous disease. 4. Possible cholelithiasis with mild wall thickening of the gallbladder fundus. 5. Aortic Atherosclerosis (ICD10-I70.0).    08/21/2019 Initial Diagnosis   Rectal adenocarcinoma (HCC)   08/21/2019 Cancer Staging   Staging form: Colon and Rectum, AJCC 8th Edition - Clinical stage from 08/21/2019: Stage IIIB (cT3, cN1, cM0) - Signed by Malachy Mood, MD on 08/21/2019   08/31/2019 - 12/07/2019 Chemotherapy   FOLFOX q2weeks starting 08/31/19. Removed 5FU bolus and increased steroids pre-meds due to thrombocytopenia and skin rash starting with C4. Completed 12/07/19    11/21/2019 Imaging   CT AP W contrast  IMPRESSION: 1. Stable exam. No evidence for metastatic disease within the abdomen or pelvis. 2. Splenomegaly.  New from previous exam. 3. Gallstones.   Aortic Atherosclerosis (ICD10-I70.0).   12/25/2019 - 02/01/2020 Chemotherapy   Concurrent chemoRT with Xeloda 2000mg  in the AM and 1500mg  in the PM on days of RT  12/25/19-7/15/2   12/25/2019 - 02/01/2020 Radiation Therapy   Concurrent  chemoRT with Dr Mitzi Hansen 12/25/19-02/01/20   04/03/2020 Surgery   XI ROBOTIC ASSISTED LOWER ANTERIOR RESECTION, SPLENIC FLEXURE MOBILIZATION, RIGID PROCTOSCOPY and DIVERTING LOOP ILEOSTOMY with Dr Lang Snow and Dr Michaell Cowing   04/03/2020 Pathology Results   FINAL MICROSCOPIC DIAGNOSIS:   A. RECTOSIGMOID COLON, LOW ANTERIOR RESECTION:  - No residual invasive carcinoma status post neoadjuvant treatment.  - See oncology table.   B. FINAL DISTAL MARGIN, EXCISION:  - Unremarkable colonic mucosa.    06/12/2020 Surgery   ILEOSTOMY REVERSAL and PAC removal by Dr Maisie Fus    11/26/2020 Imaging   CT C/A/P  IMPRESSION: 1. Changes of prior low anterior resection. Soft tissue nodularity in the presacral space/mesorectal fat, which are nonspecific and may reflect post treatment/surgical changes but disease involvement not excluded. Attention on close interval follow-up exams is recommended. 2. No evidence of metastatic disease  within the chest. 3. Similar splenomegaly. 4. Cholelithiasis without evidence of acute cholecystitis. 5. Aortic atherosclerosis.    02/26/2021 Imaging   IMPRESSION: 1. Status post low anterior resection. Nodularity in the presacral space is stable to minimally increased in the interval. Continued close attention recommended as recurrent disease not excluded. 2. Cholelithiasis. 3. Aortic Atherosclerosis (ICD10-I70.0).   03/21/2021 PET scan   IMPRESSION: 1. Small presacral nodule which appears minimally more conspicuous since May 2022, measuring 9 mm today compared to 6 mm than shows low level hypermetabolism. Continued close attention on follow-up recommended as recurrent/metastatic disease is a concern. 2. Hypermetabolic FDG uptake in the anal region likely physiologic. Attention on follow-up recommended. 3. No evidence for distant metastases in the chest, abdomen, or pelvis. 4. Hepatic steatosis. 5. Cholelithiasis. 6.  Aortic Atherosclerois (ICD10-170.0)   04/29/2021 Pathology Results   FINAL MICROSCOPIC DIAGNOSIS:   A. LYMPH NODE, PRE-SACRAL, BIOPSY:  - Reactive lymphoid hyperplasia  - No metastatic carcinoma identified    08/19/2021 Imaging   EXAM: CT ABDOMEN AND PELVIS WITH CONTRAST  IMPRESSION: 1. No new suspicious mass or lymphadenopathy identified in the abdomen or pelvis. 2. Postsurgical changes of the lower rectum. Decreased size of presacral nodular densities measuring up to 6 mm in short axis. 3. Colonic diverticulosis. 4. Other ancillary findings as described.      Discussed the use of AI scribe software for clinical note transcription with the patient, who gave verbal consent to proceed.  History of Present Illness   The patient, a 52 year old male with a history of colon cancer, HIV, and hypertension, presents for a follow-up visit. The patient reports no new symptoms and denies any cough or chest discomfort. The patient has a past smoking history but quit approximately 20 years ago. The patient's bowel movements have improved, and he denies any blood in the stool or difficulty moving his  bowels.  Recent CT scans have revealed small spots in the patient's lungs. These spots have been slowly growing over time, but the patient has not reported any respiratory symptoms. The patient is currently on HIV medication, which could potentially contribute to lung changes. The patient's mother has a history of lung nodules, but it is unclear if this is related to the patient's current lung findings.  The patient also reports being off Zoloft, which previously caused suicidal thoughts. The patient's blood pressure is slightly elevated, and he is currently on medication for this.         All other systems were reviewed with the patient and are negative.  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    Port-A-Cath in place 10/12/2019   rectal ca dx'd 06/2019   Renal insufficiency     SURGICAL HISTORY: Past Surgical History:  Procedure Laterality Date   APPENDECTOMY     52 years old    DIVERTING ILEOSTOMY N/A 04/03/2020   Procedure: DIVERTING LOOP ILEOSTOMY;  Surgeon: Romie Levee, MD;  Location: [facility];  Service: General;  Laterality: N/A;   FINGER SURGERY Right    ILEOSTOMY CLOSURE N/A 06/12/2020   Procedure: ILEOSTOMY REVERSAL;  Surgeon: Romie Levee, MD;  Location: [facility];  Service: General;  Laterality: N/A;   INCISIONAL HERNIA REPAIR N/A 09/26/2020   Procedure: LAPAROSCOPIC INCISIONAL HERNIA REPAIR WITH MESH;  Surgeon: Romie Levee, MD;  Location: [facility];  Service: General;  Laterality: N/A;   IR IMAGING GUIDED PORT INSERTION  08/30/2019   PORT-A-CATH REMOVAL Right 06/12/2020   Procedure: REMOVAL PORT-A-CATH;  Surgeon: Romie Levee, MD;  Location: [facility];  Service: General;  Laterality: Right;   SHOULDER SURGERY Bilateral 52 years old   WISDOM TOOTH EXTRACTION     XI ROBOTIC ASSISTED LOWER ANTERIOR RESECTION N/A  04/03/2020   Procedure: XI ROBOTIC ASSISTED LOWER ANTERIOR RESECTION, SPLENIC FLEXURE MOBILIZATION, RIGID PROCTOSCOPY;  Surgeon: Romie Levee, MD;  Location: [facility];  Service: General;  Laterality: N/A;    I have reviewed the social history and family history with the patient and they are unchanged from previous note.  ALLERGIES:  has No Known Allergies.  MEDICATIONS:  Current Outpatient Medications  Medication Sig Dispense Refill   amLODipine (NORVASC) 5 MG tablet Take 5 mg by mouth daily.      atorvastatin (LIPITOR) 10 MG tablet Take 10 mg by mouth daily.      bictegravir-emtricitabine-tenofovir AF (BIKTARVY) 50-200-25 MG TABS tablet TAKE 1 TABLET BY MOUTH DAILY. 30 tablet 5   losartan (COZAAR) 25 MG tablet Take 25 mg by mouth daily.      Multiple Vitamin (MULTIVITAMIN) tablet Take 1 tablet by mouth daily.     No current facility-administered medications for this visit.    PHYSICAL EXAMINATION: ECOG PERFORMANCE STATUS: 0 - Asymptomatic  Vitals:   06/07/23 1106 06/07/23 1107  BP: (!) 154/95 (!) 140/85  Pulse: 89   Resp: 18   Temp: 98.4 F (36.9 C)   SpO2: 98%    Wt Readings from Last 3 Encounters:  06/07/23 214 lb 6.4 oz (97.3 kg)  12/29/22 223 lb (101.2 kg)  12/10/22 224 lb 1.6 oz (101.7 kg)     GENERAL:alert, no distress and comfortable SKIN: skin color, texture, turgor are normal, no rashes or significant lesions EYES: normal, Conjunctiva are pink and non-injected, sclera clear NECK: supple, thyroid normal size, non-tender, without nodularity LYMPH:  no palpable lymphadenopathy in the cervical, axillary or groin area LUNGS: clear to auscultation and percussion with normal breathing effort HEART: regular rate & rhythm and no murmurs and no lower extremity edema ABDOMEN:abdomen soft, non-tender and normal bowel sounds Musculoskeletal:no cyanosis of digits and no clubbing  NEURO: alert & oriented x 3 with fluent speech, no focal motor/sensory deficits RECTAL: No  abnormalities felt, no blood detected.      LABORATORY DATA:  I have reviewed the data as listed    Latest Ref Rng & Units 06/03/2023    8:30 AM 12/07/2022   10:53 AM 05/14/2022    1:01 PM  CBC  WBC 4.0 - 10.5 K/uL 4.7  5.8  5.4   Hemoglobin 13.0 - 17.0 g/dL 65.7  84.6  96.2   Hematocrit 39.0 - 52.0 % 46.3  39.0  38.2   Platelets 150 - 400 K/uL 201  195  201         Latest Ref Rng & Units 06/03/2023    8:30 AM 12/15/2022    9:00 AM 12/07/2022   10:53 AM  CMP  Glucose 70 - 99 mg/dL 97  952  841   BUN 6 - 20 mg/dL 9  15  15    Creatinine 0.61 - 1.24 mg/dL 3.24  4.01  0.27   Sodium 135 - 145 mmol/L 140  139  140   Potassium 3.5 - 5.1 mmol/L 4.0  4.0  3.9   Chloride 98 - 111 mmol/L 108  106  104   CO2 22 - 32 mmol/L 26  25  29    Calcium 8.9 - 10.3 mg/dL 9.2  9.3  9.2   Total Protein 6.5 - 8.1 g/dL 6.8  6.3  6.8   Total Bilirubin <1.2 mg/dL 1.0  0.7  0.9   Alkaline Phos 38 - 126 U/L 77   78   AST 15 - 41 U/L 25  29  31    ALT 0 - 44 U/L 29  38  45       RADIOGRAPHIC STUDIES: I have personally reviewed the radiological images as listed and agreed with the findings in the report. No results found.    Orders Placed This Encounter  Procedures   Ambulatory referral to Pulmonology    Referral Priority:   Routine    Referral Type:   Consultation    Referral Reason:   Specialty Services Required    Requested Specialty:   Pulmonary Disease    Number of Visits Requested:   1   All questions were answered. The patient knows to call the clinic with any problems, questions or concerns. No barriers to learning was detected. The total time spent in the appointment was 30 minutes.     Malachy Mood, MD 06/07/2023

## 2023-06-08 ENCOUNTER — Other Ambulatory Visit: Payer: Self-pay

## 2023-06-10 ENCOUNTER — Other Ambulatory Visit: Payer: Self-pay

## 2023-06-10 NOTE — Progress Notes (Signed)
Specialty Pharmacy Ongoing Clinical Assessment Note  Joshua Zhang is a 52 y.o. male who is being followed by the specialty pharmacy service for RxSp HIV   Patient's specialty medication(s) reviewed today: Bictegravir-Emtricitab-Tenofov   Missed doses in the last 4 weeks: 0   Patient/Caregiver did not have any additional questions or concerns.   Therapeutic benefit summary: Patient is achieving benefit   Adverse events/side effects summary: No adverse events/side effects   Patient's therapy is appropriate to: Continue    Goals Addressed             This Visit's Progress    Achieve Undetectable HIV Viral Load < 20       Patient is on track. Patient will maintain adherence         Follow up:  6 months  Bobette Mo Specialty Pharmacist

## 2023-06-10 NOTE — Progress Notes (Signed)
Specialty Pharmacy Refill Coordination Note  Joshua Zhang is a 52 y.o. male contacted today regarding refills of specialty medication(s) Bictegravir-Emtricitab-Tenofov   Patient requested Delivery   Delivery date: 06/14/23   Verified address: [address]   [name] [address]   Medication will be filled on 06/11/23.

## 2023-06-11 ENCOUNTER — Other Ambulatory Visit (HOSPITAL_COMMUNITY): Payer: Self-pay

## 2023-06-29 ENCOUNTER — Ambulatory Visit: Payer: BC Managed Care – PPO | Admitting: Internal Medicine

## 2023-06-29 ENCOUNTER — Other Ambulatory Visit: Payer: Self-pay

## 2023-06-29 ENCOUNTER — Encounter: Payer: Self-pay | Admitting: Internal Medicine

## 2023-06-29 VITALS — BP 134/90 | HR 69 | Temp 98.1°F | Ht 69.5 in | Wt 215.0 lb

## 2023-06-29 DIAGNOSIS — B20 Human immunodeficiency virus [HIV] disease: Secondary | ICD-10-CM | POA: Diagnosis not present

## 2023-06-29 DIAGNOSIS — Z113 Encounter for screening for infections with a predominantly sexual mode of transmission: Secondary | ICD-10-CM

## 2023-06-29 DIAGNOSIS — Z1159 Encounter for screening for other viral diseases: Secondary | ICD-10-CM

## 2023-06-29 DIAGNOSIS — Z111 Encounter for screening for respiratory tuberculosis: Secondary | ICD-10-CM

## 2023-06-29 MED ORDER — ATORVASTATIN CALCIUM 40 MG PO TABS: 40.0000 mg | ORAL_TABLET | Freq: Every day | ORAL | 11 refills | Status: DC

## 2023-06-29 NOTE — Progress Notes (Signed)
Patient Active Problem List   Diagnosis Date Noted   Status post reversal of ileostomy 06/14/2020   Hyperglycemia 06/14/2020   Neuropathy    Hypertension    History of chemotherapy    Ileostomy present (HCC) 06/12/2020   Impaired fasting glucose 02/02/2020   Rectal adenocarcinoma (HCC) 08/21/2019   Injury of tendon of triceps 03/03/2019   Mixed hyperlipidemia 07/26/2018   Low back pain 09/29/2017   Essential hypertension 04/01/2017   Renal insufficiency 03/31/2017   Dyslipidemia 03/31/2017   HIV disease (HCC) 11/11/2016   Tinea versicolor 04/22/2016    Patient's Medications  New Prescriptions   No medications on file  Previous Medications   AMLODIPINE (NORVASC) 5 MG TABLET    Take 5 mg by mouth daily.    ATORVASTATIN (LIPITOR) 10 MG TABLET    Take 10 mg by mouth daily.    BICTEGRAVIR-EMTRICITABINE-TENOFOVIR AF (BIKTARVY) 50-200-25 MG TABS TABLET    TAKE 1 TABLET BY MOUTH DAILY.   LOSARTAN (COZAAR) 25 MG TABLET    Take 25 mg by mouth daily.    MULTIPLE VITAMIN (MULTIVITAMIN) TABLET    Take 1 tablet by mouth daily.  Modified Medications   No medications on file  Discontinued Medications   No medications on file    Subjective: Joshua Zhang is in for his routine HIV (heterosexual) follow-up visit.  He has been seen by dr Orvan Falconer previously  06/29/23 id clinic f/u Due for colonoscopy this coming year Pcp to manage age-appropriate cancer screening No concern today Reviewed meds only 10 mg lipitor -- discussed reprieve No other question No missed dose biktarvy last 4 weeks Planning to go home in [place] this christmas   12/29/22 id clinic f/u First visit with me Taking biktarvy/compliant No complaint today Hx rectal cancer stage ct3n1, sp 8 cycles neoadjuvant folfox RT, s/p surgery 03/2020 clear margin, no residual cancer and ileostomy take down done 05/2020 -- routine f/u ct 08/2022 no evidence disease. 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 He was from [place] originally. Not previously to [place], but been to [place], [place], and all over the [place].  Review of Systems: Review of Systems  Constitutional:  Negative for fever and weight loss.  Psychiatric/Behavioral:  Negative for depression.     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    Port-A-Cath in place 10/12/2019   rectal ca dx'd 06/2019   Renal insufficiency     Social History   Tobacco Use   Smoking status: Former    Current packs/day: 0.00    Average packs/day: 0.3 packs/day for 5.0 years (1.3 ttl pk-yrs)    Types: Cigarettes    Start date: 6    Quit date: 2003    Years since quitting: 21.9   Smokeless tobacco: Never   Tobacco comments:    Quit 18 years ago  Vaping Use   Vaping status: Never Used  Substance Use Topics   Alcohol use: Yes  Comment: 5-6 driniks per week    Drug use: Not Currently    Family History  Problem Relation Age of Onset   Breast cancer Mother    Aortic aneurysm Father     No Known Allergies  Health Maintenance  Topic Date Due   INFLUENZA VACCINE  02/18/2023   COVID-19 Vaccine (4 - 2023-24 season) 03/21/2023   Colonoscopy  09/06/2030   DTaP/Tdap/Td (4 - Td or Tdap) 09/27/2032   Hepatitis C Screening  Completed   HIV Screening  Completed   Zoster Vaccines- Shingrix  Completed   HPV VACCINES  Aged Out    Objective:  Vitals:   06/29/23 1111  BP: (!) 134/90  Pulse: 69  Temp: 98.1 F (36.7 C)  TempSrc: Temporal  SpO2: 99%  Weight: 215 lb (97.5 kg)  Height: 5' 9.5"  (1.765 m)   Body mass index is 31.29 kg/m.  Physical Exam Constitutional:      Comments: He is in good spirits.  Cardiovascular:     Rate and Rhythm: Normal rate and regular rhythm.     Heart sounds: No murmur heard. Pulmonary:     Effort: Pulmonary effort is normal.     Breath sounds: Normal breath sounds.  Psychiatric:        Mood and Affect: Mood normal.     Lab Results Lab Results  Component Value Date   WBC 4.7 06/03/2023   HGB 16.8 06/03/2023   HCT 46.3 06/03/2023   MCV 95.7 06/03/2023   PLT 201 06/03/2023    Lab Results  Component Value Date   CREATININE 1.05 06/03/2023   BUN 9 06/03/2023   NA 140 06/03/2023   K 4.0 06/03/2023   CL 108 06/03/2023   CO2 26 06/03/2023    Lab Results  Component Value Date   ALT 29 06/03/2023   AST 25 06/03/2023   ALKPHOS 77 06/03/2023   BILITOT 1.0 06/03/2023    Lab Results  Component Value Date   CHOL 224 (H) 12/15/2022   HDL 47 12/15/2022   LDLCALC 127 (H) 12/15/2022   TRIG 351 (H) 12/15/2022   CHOLHDL 4.8 12/15/2022   Lab Results  Component Value Date   LABRPR NON-REACTIVE 12/15/2022   HIV 1 RNA Quant  Date Value  12/15/2022 <20 Copies/mL (H)  12/11/2021 NOT DETECTED copies/mL  12/24/2020 Not Detected Copies/mL   CD4 T Cell Abs (/uL)  Date Value  12/15/2022 291 (L)  12/11/2021 376 (L)  12/24/2020 248 (L)     Problem List Items Addressed This Visit       Other   HIV disease (HCC) - Primary   Relevant Orders   HIV 1 RNA quant-no reflex-bld   Other Visit Diagnoses     Screening for STDs (sexually transmitted diseases)       Relevant Orders   Urine cytology ancillary only([facility])   Screening-pulmonary TB       Relevant Orders   QuantiFERON-TB Gold Plus   Need for hepatitis B screening test       Relevant Orders   Hepatitis B surface antibody,quantitative   Hepatitis B Surface AntiGEN   Hepatitis B Core Antibody, total   Need for hepatitis C screening test       Relevant Orders    Hepatitis C antibody        #hiv #reprieve -- increased lipitor from 10 to 40 mg 06/29/23 Doing well on biktarvy. compliant    -discussed u=u -encourage compliance -continue current HIV medication -labs  12 months during f/u -f/u in 12 months    #colon cancer No evidence disease  -f/u oncology; due for repeat colonoscopy   #prostate cancer screening Psa ordered and he'll do in a few weeks  -Advise getting pcp to f/u with this    #hcm -vaccination Due conjugate pneumonococcal conj vaccine -- will do in 1 year visit Td 2024 Meningococcal 2018 Hep b by 2018 -hepatitis Retest antibody hep b level today 06/2023; 2018 hep b and c serology negative -std Deferred -- monogamous relationship -cancer screening F/u pcp for age appropriate cancer screening     Raymondo Band, MD [facility] [phone] pager   [phone] cell 06/29/2023, 11:24 AM

## 2023-06-29 NOTE — Addendum Note (Signed)
Addended by: Philippa Chester on: 06/29/2023 12:48 PM   Modules accepted: Orders

## 2023-06-29 NOTE — Patient Instructions (Signed)
Cancer screening --> check with pcp Colonoscopy already setup Prostate cancer screening needs once year (digital rectal exam and blood test)   Continue biktarvy for your hiv   Increase lipitor to 40 mg once a day (let me know if body aches). New prescription is at [facility]   Pneumonia shot next visit Hep b immunity status being checked this visit   See you in 1 year or sooner if anything comes up

## 2023-07-02 LAB — HEPATITIS B CORE ANTIBODY, TOTAL: Hep B Core Total Ab: NONREACTIVE

## 2023-07-02 LAB — QUANTIFERON-TB GOLD PLUS
Mitogen-NIL: 8.55 [IU]/mL
NIL: 0.14 [IU]/mL
QuantiFERON-TB Gold Plus: NEGATIVE
TB1-NIL: <0 [IU]/mL
TB2-NIL: <0 [IU]/mL

## 2023-07-02 LAB — HEPATITIS C ANTIBODY: Hepatitis C Ab: NONREACTIVE

## 2023-07-02 LAB — HEPATITIS B SURFACE ANTIBODY, QUANTITATIVE: Hep B S AB Quant (Post): <5 m[IU]/mL — ABNORMAL LOW (ref >=10)

## 2023-07-02 LAB — HIV-1 RNA QUANT-NO REFLEX-BLD
HIV 1 RNA Quant: NOT DETECTED {copies}/mL
HIV-1 RNA Quant, Log: NOT DETECTED {Log}

## 2023-07-02 LAB — HEPATITIS B SURFACE ANTIGEN: Hepatitis B Surface Ag: NONREACTIVE

## 2023-07-08 ENCOUNTER — Other Ambulatory Visit: Payer: Self-pay

## 2023-07-08 ENCOUNTER — Other Ambulatory Visit: Payer: Self-pay | Admitting: Internal Medicine

## 2023-07-08 DIAGNOSIS — B20 Human immunodeficiency virus [HIV] disease: Secondary | ICD-10-CM

## 2023-07-08 MED ORDER — BIKTARVY 50-200-25 MG PO TABS
1.0000 | ORAL_TABLET | Freq: Every day | ORAL | 5 refills | Status: DC
  Filled 2023-07-08: qty 30, 30d supply, fill #0
  Filled 2023-08-03: qty 30, 30d supply, fill #1
  Filled 2023-09-08: qty 30, 30d supply, fill #2
  Filled 2023-10-04 (×2): qty 30, 30d supply, fill #3
  Filled 2023-11-04 (×2): qty 30, 30d supply, fill #4
  Filled 2023-12-03: qty 30, 30d supply, fill #5

## 2023-07-08 NOTE — Progress Notes (Signed)
Specialty Pharmacy Refill Coordination Note  Joshua Zhang is a 52 y.o. male contacted today regarding refills of specialty medication(s) Bictegravir-Emtricitab-Tenofov [name]   Patient requested Delivery   Delivery date: 07/13/23   Verified address: [address]   [place] [address]   Medication will be filled on 07/12/23.   Pending refill request

## 2023-07-12 ENCOUNTER — Other Ambulatory Visit: Payer: Self-pay

## 2023-07-30 ENCOUNTER — Other Ambulatory Visit: Payer: Self-pay

## 2023-08-02 ENCOUNTER — Other Ambulatory Visit (HOSPITAL_COMMUNITY): Payer: Self-pay

## 2023-08-03 ENCOUNTER — Other Ambulatory Visit: Payer: Self-pay

## 2023-08-03 NOTE — Progress Notes (Signed)
 Specialty Pharmacy Refill Coordination Note  Joshua Zhang is a 53 y.o. male contacted today regarding refills of specialty medication(s) Bictegravir-Emtricitab-Tenofov (Biktarvy )   Patient requested Delivery   Delivery date: 08/11/23   Verified address: [address]   [place] [address]   Medication will be filled on 08/10/23.

## 2023-08-17 ENCOUNTER — Encounter: Payer: Self-pay | Admitting: Hematology

## 2023-09-08 ENCOUNTER — Other Ambulatory Visit: Payer: Self-pay

## 2023-09-08 ENCOUNTER — Other Ambulatory Visit (HOSPITAL_COMMUNITY): Payer: Self-pay

## 2023-09-08 ENCOUNTER — Ambulatory Visit: Payer: Self-pay | Admitting: Physician Assistant

## 2023-09-08 NOTE — Progress Notes (Signed)
 Specialty Pharmacy Refill Coordination Note  Joshua Zhang is a 53 y.o. male contacted today regarding refills of specialty medication(s) Bictegravir-Emtricitab-Tenofov [name]   Patient requested Delivery   Delivery date: 09/14/23   Verified address: [address]   [place] [address]   Medication will be filled on 09/13/23.

## 2023-09-08 NOTE — Telephone Encounter (Signed)
  Chief Complaint: body aches Symptoms: fever, cough, sore throat Frequency: constant   Disposition: [] ED /[] Urgent Care (no appt availability in office) / [x] Appointment(In office/virtual)/ []  [facility]/ [] Home Care/ [] Refused Recommended Disposition /[] [facility]/ []  Follow-up with PCP Additional Notes: Pt calling with symptoms of generalized body aches with fever, sore throat, and cough.  Pt started feeling bad this morning. Fever has been 99-100. Pt denies nay SOB. Per protocol, pt to be seen within 3 days. Pt has appt on 2/20 @ 1440. RN gave care advice and pt verbalized understanding. Pt is scheduled for colonoscopy for Monday and has concerns on rescheduling it.           Copied from CRM [id number]. Topic: Clinical - Red Word Triage >> Sep 08, 2023 12:11 PM Elle L wrote: Red Word that prompted transfer to Nurse Triage: The patient has a colonoscopy scheduled for Monday but he believes that he has the flu. He has body aches, pain, fever of 100. Reason for Disposition  [1] MODERATE pain (e.g., interferes with normal activities) AND [2] present > 3 days  Answer Assessment - Initial Assessment Questions 1. ONSET: "When did the muscle aches or body pains start?"      This morning  2. LOCATION: "What part of your body is hurting?" (e.g., entire body, arms, legs)      Whole body 3. SEVERITY: "How bad is the pain?" (Scale 1-10; or mild, moderate, severe)   - MILD (1-3): doesn't interfere with normal activities    - MODERATE (4-7): interferes with normal activities or awakens from sleep    - SEVERE (8-10):  excruciating pain, unable to do any normal activities      6-7 4. CAUSE: "What do you think is causing the pains?"     flu 5. FEVER: "Have you been having fever?"     99-100 6. OTHER SYMPTOMS: "Do you have any other symptoms?" (e.g., chest pain, weakness, rash, cold or flu symptoms, weight loss)     Fever, sore throat, cough,   8. TRAVEL: "Have you  traveled out of the country in the last month?" (e.g., travel history, exposures)     no  Protocols used: Muscle Aches and Body Pain-A-AH

## 2023-09-09 ENCOUNTER — Encounter: Payer: BC Managed Care – PPO | Admitting: Family Medicine

## 2023-09-09 ENCOUNTER — Encounter: Payer: Self-pay | Admitting: Hematology

## 2023-09-13 ENCOUNTER — Other Ambulatory Visit (HOSPITAL_COMMUNITY): Payer: Self-pay

## 2023-09-13 NOTE — Progress Notes (Signed)
Error

## 2023-09-30 ENCOUNTER — Ambulatory Visit: Admitting: Emergency Medicine

## 2023-09-30 ENCOUNTER — Encounter: Payer: Self-pay | Admitting: Emergency Medicine

## 2023-09-30 VITALS — BP 128/87 | HR 94 | Ht 70.0 in | Wt 201.6 lb

## 2023-09-30 DIAGNOSIS — R918 Other nonspecific abnormal finding of lung field: Secondary | ICD-10-CM | POA: Insufficient documentation

## 2023-09-30 NOTE — Progress Notes (Signed)
 Subjective:    Patient ID: Joshua Zhang, male    DOB: 1971-04-23, 53 y.o.   MRN: [id number]  HPI 53 year old gentleman, former minimal smoker (1-2 pack years), HIV on therapy, hypertension, and rectal cancer followed by Dr Mosetta Putt, diagnosed 06/2019.  Treated with neoadjuvant chemotherapy and then underwent surgical resection 03/2020.  He has been undergoing surveillance imaging that showed pulmonary nodules that have been followed.  He is here to discuss the nodules, 1 of which has had an interval increase in size compared with 12/08/2022. He had COVID in May 2022.  No SOB, feels well. He has a chronic globus sensation, worse last few months. He has PND, no real GERD   CT chest 06/03/2023 reviewed by me shows no mediastinal or hilar adenopathy, some calcified nodal tissue in the right hilum consistent with granulomatous disease.  7 x 6 mm anterior left upper lobe nodule (from 6 x 4 mm), 8 x 7 mm anterior right lung nodule (from 5 x 3 mm), stable peripheral right lower lobe granuloma, calcified granuloma right costophrenic sulcus.  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    Port-A-Cath in place 10/12/2019   rectal ca dx'd 06/2019   Renal insufficiency      Family History  Problem Relation Age of Onset   Breast cancer Mother    Aortic aneurysm Father      Social History   Socioeconomic History   Marital status: Married    Spouse name: Engineer, manufacturing   Number of children: 1   Years of education: some college    Highest education level: Not on file  Occupational History   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   Tobacco Use   Smoking status: Former    Current packs/day: 0.00    Average packs/day: 0.3 packs/day for 5.0 years (1.3 ttl pk-yrs)    Types:  Cigarettes    Start date: 4    Quit date: 2003    Years since quitting: 22.2   Smokeless tobacco: Never   Tobacco comments:    Quit 18 years ago  Vaping Use   Vaping status: Never Used  Substance and Sexual Activity   Alcohol use: Yes    Comment: 5-6 driniks per week    Drug use: Not Currently   Sexual activity: Not Currently    Partners: Female    Comment: declined condoms 12/25/21  Other Topics Concern   Not on file  Social History Narrative   Not on file   Social Drivers of Health   Financial Resource Strain: Not on file  Food Insecurity: Low Risk  (08/10/2023)   Received from [facility]   Hunger Vital Sign    Worried About Running Out of Food in the Last Year: Never true    Ran Out of Food in the Last Year: Never true  Transportation Needs: No Transportation Needs (08/10/2023)   Received from [facility]    In the past 12 months, has lack of reliable transportation kept you from medical appointments, meetings, work or from getting things needed for daily living? : No  Physical Activity: Not on file  Stress: Not on file  Social Connections: Not on file  Intimate Partner Violence:  Not on file    From [place], has lived up and down the [address].  Former Psychologist, prison and probation services, now Airline pilot  No TB exposure No mold exposure No hot tub No birds.   No Known Allergies   Outpatient Medications Prior to Visit  Medication Sig Dispense Refill   amLODipine (NORVASC) 5 MG tablet Take 5 mg by mouth daily.     atorvastatin (LIPITOR) 40 MG tablet Take 1 tablet (40 mg total) by mouth daily. 30 tablet 11   bictegravir-emtricitabine-tenofovir AF (BIKTARVY) 50-200-25 MG TABS tablet TAKE 1 TABLET BY MOUTH DAILY. 30 tablet 5   losartan (COZAAR) 25 MG tablet Take 25 mg by mouth daily.      Multiple Vitamin (MULTIVITAMIN) tablet Take 1 tablet by mouth daily.     No facility-administered medications prior to visit.        Objective:   Physical Exam Vitals:    09/30/23 0923  BP: 128/87  Pulse: 94  SpO2: 97%  Weight: 201 lb 9.6 oz (91.4 kg)  Height: 5\' 10"  (1.778 m)   Gen: Pleasant, well-nourished, in no distress,  normal affect  ENT: No lesions,  mouth clear,  oropharynx clear, no postnasal drip  Neck: No JVD, no stridor  Lungs: No use of accessory muscles, no crackles or wheezing on normal respiration, no wheeze on forced expiration  Cardiovascular: RRR, heart sounds normal, no murmur or gallops, no peripheral edema  Musculoskeletal: No deformities, no cyanosis or clubbing  Neuro: alert, awake, non focal  Skin: Warm, no lesions or rash      Assessment & Plan:  Pulmonary nodules Pulmonary nodules originally noted in May 2022 and patient with a history of rectal cancer, HIV.  He has never had AIDS or opportunistic infection, has always had undetectable viral load since he has been on therapy.  This would make an opportunistic infection or Kaposi's sarcoma very unlikely.  He has a history of granulomas and some calcified granulomatous disease which is also reassuring.  That said at least moderate suspicion for possible malignancy given his history of rectal cancer.  The nodules will need to be followed closely.  If there is any further increase in size then strongly favor bronchoscopy and biopsies.  Would also probably perform PET scan under those circumstances.  Will repeat his CT at the 58-month mark which would be May.  Then review.  If there is an interval change then we will arrange for bronchoscopy   Levy Pupa, MD, PhD 09/30/2023, 10:16 AM [facility] [phone] or if no answer before 7:00PM call [phone] For any issues after 7:00PM please call eLink [phone]

## 2023-09-30 NOTE — Patient Instructions (Signed)
 We reviewed your CT scans of the chest today. We will perform a repeat CT chest in May 2025 to follow small pulmonary nodules. Follow Dr. Delton Coombes in May after your CT scan so we can review those results together.  Depending on the scan results we will decide whether other testing is indicated

## 2023-09-30 NOTE — Assessment & Plan Note (Signed)
 Pulmonary nodules originally noted in May 2022 and patient with a history of rectal cancer, HIV.  He has never had AIDS or opportunistic infection, has always had undetectable viral load since he has been on therapy.  This would make an opportunistic infection or Kaposi's sarcoma very unlikely.  He has a history of granulomas and some calcified granulomatous disease which is also reassuring.  That said at least moderate suspicion for possible malignancy given his history of rectal cancer.  The nodules will need to be followed closely.  If there is any further increase in size then strongly favor bronchoscopy and biopsies.  Would also probably perform PET scan under those circumstances.  Will repeat his CT at the 18-month mark which would be May.  Then review.  If there is an interval change then we will arrange for bronchoscopy

## 2023-10-04 ENCOUNTER — Other Ambulatory Visit: Payer: Self-pay

## 2023-10-04 ENCOUNTER — Other Ambulatory Visit (HOSPITAL_COMMUNITY): Payer: Self-pay

## 2023-10-04 NOTE — Progress Notes (Signed)
 Specialty Pharmacy Refill Coordination Note  Joshua Zhang is a 53 y.o. male contacted today regarding refills of specialty medication(s) Biktarvy  Patient requested (Patient-Rptd) Delivery   Delivery date: (Patient-Rptd) 10/14/23   Verified address: (Patient-Rptd) [address]   Medication will be filled on 10/13/23.

## 2023-10-07 ENCOUNTER — Other Ambulatory Visit

## 2023-10-13 ENCOUNTER — Other Ambulatory Visit: Payer: Self-pay

## 2023-10-26 ENCOUNTER — Other Ambulatory Visit

## 2023-11-03 ENCOUNTER — Other Ambulatory Visit: Payer: Self-pay

## 2023-11-04 ENCOUNTER — Other Ambulatory Visit: Payer: Self-pay

## 2023-11-04 ENCOUNTER — Other Ambulatory Visit (HOSPITAL_COMMUNITY): Payer: Self-pay

## 2023-11-04 NOTE — Progress Notes (Signed)
 Specialty Pharmacy Refill Coordination Note  Joshua Zhang is a 53 y.o. male contacted today regarding refills of specialty medication(s) Biktarvy.  Patient requested (Patient-Rptd) Delivery   Delivery date: (Patient-Rptd) 11/12/23   Verified address: (Patient-Rptd) [address]   Medication will be filled on 11/11/23.

## 2023-11-16 ENCOUNTER — Encounter: Payer: Self-pay | Admitting: Emergency Medicine

## 2023-11-22 ENCOUNTER — Ambulatory Visit
Admission: RE | Admit: 2023-11-22 | Discharge: 2023-11-22 | Disposition: A | Discharge status: Home / Self Care | Source: Ambulatory Visit | Attending: Emergency Medicine | Admitting: Emergency Medicine

## 2023-11-22 DIAGNOSIS — R918 Other nonspecific abnormal finding of lung field: Secondary | ICD-10-CM

## 2023-12-02 ENCOUNTER — Other Ambulatory Visit (HOSPITAL_COMMUNITY): Payer: Self-pay

## 2023-12-03 ENCOUNTER — Other Ambulatory Visit: Payer: Self-pay | Admitting: Pharmacy Technician

## 2023-12-03 ENCOUNTER — Other Ambulatory Visit: Payer: Self-pay

## 2023-12-03 NOTE — Progress Notes (Signed)
 Specialty Pharmacy Refill Coordination Note  Joshua Zhang is a 53 y.o. male contacted today regarding refills of specialty medication(s) Bictegravir-Emtricitab-Tenofov (Biktarvy )   Patient requested Delivery   Delivery date: 12/10/23   Verified address: [address] [place]   Medication will be filled on 12/09/23.

## 2023-12-09 ENCOUNTER — Encounter: Payer: Self-pay | Admitting: Emergency Medicine

## 2023-12-09 ENCOUNTER — Ambulatory Visit: Admitting: Emergency Medicine

## 2023-12-09 VITALS — BP 128/86 | HR 88 | Ht 70.0 in | Wt 199.0 lb

## 2023-12-09 DIAGNOSIS — Z87891 Personal history of nicotine dependence: Secondary | ICD-10-CM

## 2023-12-09 DIAGNOSIS — R918 Other nonspecific abnormal finding of lung field: Secondary | ICD-10-CM

## 2023-12-09 NOTE — Progress Notes (Signed)
 Subjective:    Patient ID: Joshua Zhang, male    DOB: 1970-12-10, 53 y.o.   MRN: [id number]  HPI 53 year old gentleman, former minimal smoker (1-2 pack years), HIV on therapy, hypertension, and rectal cancer followed by Dr Maryalice Smaller, diagnosed 06/2019.  Treated with neoadjuvant chemotherapy and then underwent surgical resection 03/2020.  He has been undergoing surveillance imaging that showed pulmonary nodules that have been followed.  He is here to discuss the nodules, 1 of which has had an interval increase in size compared with 12/08/2022. He had COVID in May 2022.  No SOB, feels well. He has a chronic globus sensation, worse last few months. He has PND, no real GERD   CT chest 06/03/2023 reviewed by me shows no mediastinal or hilar adenopathy, some calcified nodal tissue in the right hilum consistent with granulomatous disease.  7 x 6 mm anterior left upper lobe nodule (from 6 x 4 mm), 8 x 7 mm anterior right lung nodule (from 5 x 3 mm), stable peripheral right lower lobe granuloma, calcified granuloma right costophrenic sulcus.  No new suspicious nodules  ROV 12/09/23 --follow-up visit 53 year old gentleman with history of HIV, minimal tobacco history, rectal cancer treated with neoadjuvant chemotherapy and then resected in 03/2020.  I saw him in March 2025 after surveillance imaging showed increase in size of pulmonary nodules on CT chest.  This superimposed on some calcified nodal tissue in the right hilum and some calcified granulomas.  We decided to repeat his CT chest as below He feels well, no new issues reported. He had CSY that was reassuring, next one recommended for 3 yrs.   CT scan of the chest done 11/22/2023 and reviewed by me shows stable pulmonary nodule disease.  On my measurement the anterior left upper lobe nodule remains 7 mm in largest diameter, anterior right sided nodule remains 8 mm in largest diameter.  The final report is still pending.  No new nodules or findings.   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    Port-A-Cath in place 10/12/2019   rectal ca dx'd 06/2019   Renal insufficiency      Family History  Problem Relation Age of Onset   Breast cancer Mother    Aortic aneurysm Father      Social History   Socioeconomic History   Marital status: Married    Spouse name: Engineer, manufacturing   Number of children: 1   Years of education: some college    Highest education level: Not on file  Occupational History   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   Tobacco Use   Smoking status: Former    Current packs/day: 0.00    Average packs/day: 0.3 packs/day for 5.0 years (1.3 ttl pk-yrs)    Types: Cigarettes    Start date: 70    Quit date: 2003    Years since quitting: 22.4   Smokeless tobacco: Never   Tobacco comments:    Quit 18 years ago  Vaping Use   Vaping status: Never Used  Substance and Sexual Activity   Alcohol use: Yes    Comment: 5-6 driniks per week    Drug use: Not Currently   Sexual activity: Not Currently    Partners: Female    Comment: declined  condoms 12/25/21  Other Topics Concern   Not on file  Social History Narrative   Not on file   Social Drivers of Health   Financial Resource Strain: Not on file  Food Insecurity: Low Risk  (08/10/2023)   Received from [facility]   Hunger Vital Sign    Worried About Running Out of Food in the Last Year: Never true    Ran Out of Food in the Last Year: Never true  Transportation Needs: No Transportation Needs (08/10/2023)   Received from [facility]    In the past 12 months, has lack of reliable transportation kept you from medical appointments, meetings, work or from getting things needed for daily living? : No  Physical Activity: Not on file  Stress: Not on file   Social Connections: Not on file  Intimate Partner Violence: Not on file    From [place], has lived up and down the [address]  Former Psychologist, prison and probation services, now Airline pilot  No TB exposure No mold exposure No hot tub No birds.   No Known Allergies   Outpatient Medications Prior to Visit  Medication Sig Dispense Refill   amLODipine  (NORVASC ) 5 MG tablet Take 5 mg by mouth daily.     atorvastatin  (LIPITOR) 40 MG tablet Take 1 tablet (40 mg total) by mouth daily. 30 tablet 11   bictegravir-emtricitabine -tenofovir  AF (BIKTARVY ) 50-200-25 MG TABS tablet TAKE 1 TABLET BY MOUTH DAILY. 30 tablet 5   losartan  (COZAAR ) 25 MG tablet Take 25 mg by mouth daily.      Multiple Vitamin (MULTIVITAMIN) tablet Take 1 tablet by mouth daily.     No facility-administered medications prior to visit.        Objective:   Physical Exam Vitals:   12/09/23 0832  BP: 128/86  Pulse: 88  SpO2: 98%  Weight: 199 lb (90.3 kg)  Height: 5\' 10"  (1.778 m)   Gen: Pleasant, well-nourished, in no distress,  normal affect  ENT: No lesions,  mouth clear,  oropharynx clear, no postnasal drip  Neck: No JVD, no stridor  Lungs: No use of accessory muscles, no crackles or wheezing on normal respiration, no wheeze on forced expiration  Cardiovascular: RRR, heart sounds normal, no murmur or gallops, no peripheral edema  Musculoskeletal: No deformities, no cyanosis or clubbing  Neuro: alert, awake, non focal  Skin: Warm, no lesions or rash      Assessment & Plan:  Pulmonary nodules Final read is pending but review of his CT scan of the chest shows left upper lobe and right midlung nodules that are stable in size and appearance on my review.  Reassured him about this.  Have recommended that we continue to follow closely.  If there is an interval change then would push for PET scan and probably diagnostic navigational bronchoscopy.  We will repeat his CT in 6 months which would be November 2025.    Racheal Buddle, MD,  PhD 12/09/2023, 8:54 AM [facility] [phone] or if no answer before 7:00PM call [phone] For any issues after 7:00PM please call eLink [phone]

## 2023-12-09 NOTE — Patient Instructions (Signed)
 We reviewed your CT scan of the chest today.  Your pulmonary nodules that we are tracking appear to be stable in size and appearance, good news. We will plan to repeat your CT scan of the chest in November 2025. Please follow Dr. Baldwin Levee in November after your CT so we can review those results together.

## 2023-12-09 NOTE — Assessment & Plan Note (Signed)
 Final read is pending but review of his CT scan of the chest shows left upper lobe and right midlung nodules that are stable in size and appearance on my review.  Reassured him about this.  Have recommended that we continue to follow closely.  If there is an interval change then would push for PET scan and probably diagnostic navigational bronchoscopy.  We will repeat his CT in 6 months which would be November 2025.

## 2023-12-16 NOTE — Progress Notes (Signed)
 The 10-year ASCVD risk score (Arnett DK, et al., 2019) is: 7.2%   Values used to calculate the score:     Age: 53 years     Sex: Male     Is Non-Hispanic African American: No     Diabetic: No     Tobacco smoker: No     Systolic Blood Pressure: 128 mmHg     Is BP treated: Yes     HDL Cholesterol: 47 mg/dL     Total Cholesterol: 224 mg/dL  Currently prescribed atorvastatin  40 mg.  Alejos Reinhardt, BSN, RN

## 2024-01-04 ENCOUNTER — Other Ambulatory Visit: Payer: Self-pay

## 2024-01-07 ENCOUNTER — Other Ambulatory Visit: Payer: Self-pay

## 2024-01-07 ENCOUNTER — Encounter (INDEPENDENT_AMBULATORY_CARE_PROVIDER_SITE_OTHER): Payer: Self-pay

## 2024-01-07 ENCOUNTER — Other Ambulatory Visit: Payer: Self-pay | Admitting: Internal Medicine

## 2024-01-07 ENCOUNTER — Other Ambulatory Visit (HOSPITAL_BASED_OUTPATIENT_CLINIC_OR_DEPARTMENT_OTHER): Payer: Self-pay

## 2024-01-07 DIAGNOSIS — B20 Human immunodeficiency virus [HIV] disease: Secondary | ICD-10-CM

## 2024-01-07 MED ORDER — BIKTARVY 50-200-25 MG PO TABS
1.0000 | ORAL_TABLET | Freq: Every day | ORAL | 5 refills | Status: DC
  Filled 2024-01-07 – 2024-01-14 (×2): qty 30, 30d supply, fill #0
  Filled 2024-02-04: qty 30, 30d supply, fill #1
  Filled 2024-03-01 (×2): qty 30, 30d supply, fill #2
  Filled 2024-04-04: qty 30, 30d supply, fill #3
  Filled 2024-04-28 (×2): qty 30, 30d supply, fill #4
  Filled 2024-05-25 – 2024-05-29 (×2): qty 30, 30d supply, fill #5

## 2024-01-07 NOTE — Progress Notes (Signed)
 Specialty Pharmacy Refill Coordination Note  Joshua Zhang is a 53 y.o. male contacted today regarding refills of specialty medication(s) Bictegravir-Emtricitab-Tenofov (Biktarvy )   Patient requested Delivery   Delivery date: 01/13/24   Verified address: [address] [place] [address]   Medication will be filled on 01/12/24.   This fill date is pending a new prior authorization from provider. Patient is aware and if they have not received fill by intended date they must follow up with pharmacy.

## 2024-01-10 ENCOUNTER — Other Ambulatory Visit (HOSPITAL_COMMUNITY): Payer: Self-pay

## 2024-01-10 ENCOUNTER — Telehealth: Payer: Self-pay

## 2024-01-10 ENCOUNTER — Other Ambulatory Visit: Payer: Self-pay

## 2024-01-10 NOTE — Telephone Encounter (Signed)
 Submitted a Prior Authorization request to [employer] for Biktarvy  via CoverMyMeds. Will update once we receive a response.  J Code: CPT:  PA ID: [id number]

## 2024-01-11 ENCOUNTER — Other Ambulatory Visit: Payer: Self-pay

## 2024-01-12 ENCOUNTER — Other Ambulatory Visit (HOSPITAL_COMMUNITY): Payer: Self-pay

## 2024-01-12 ENCOUNTER — Other Ambulatory Visit: Payer: Self-pay

## 2024-01-13 ENCOUNTER — Encounter (HOSPITAL_COMMUNITY): Payer: Self-pay

## 2024-01-13 ENCOUNTER — Other Ambulatory Visit (HOSPITAL_COMMUNITY): Payer: Self-pay

## 2024-01-14 ENCOUNTER — Other Ambulatory Visit (HOSPITAL_COMMUNITY): Payer: Self-pay

## 2024-01-14 ENCOUNTER — Other Ambulatory Visit: Payer: Self-pay

## 2024-01-14 ENCOUNTER — Telehealth: Payer: Self-pay

## 2024-01-14 NOTE — Telephone Encounter (Signed)
 Received notification from [employer] regarding a prior authorization for Biktarvy . Authorization has been APPROVED from 01/10/24 to 01/08/25.   Per test claim, copay for 30 days supply is $200.00  Patient can continue to fill through [facility]: [phone]   Notice will be in Media

## 2024-01-31 ENCOUNTER — Other Ambulatory Visit: Payer: Self-pay

## 2024-02-04 ENCOUNTER — Other Ambulatory Visit (HOSPITAL_COMMUNITY): Payer: Self-pay

## 2024-02-04 ENCOUNTER — Encounter (INDEPENDENT_AMBULATORY_CARE_PROVIDER_SITE_OTHER): Payer: Self-pay

## 2024-02-04 ENCOUNTER — Other Ambulatory Visit: Payer: Self-pay

## 2024-02-04 NOTE — Progress Notes (Signed)
 Specialty Pharmacy Refill Coordination Note  MyChart Questionnaire Submission  Joshua Zhang is a 53 y.o. male contacted today regarding refills of specialty medication(s) Biktarvy .  Doses on hand: (Patient-Rptd) 11   Patient requested: (Patient-Rptd) Delivery   Delivery date: 02/10/24   Verified address: (Patient-Rptd) [address] [place] [address]  Medication will be filled on 02/09/24.

## 2024-02-09 ENCOUNTER — Other Ambulatory Visit: Payer: Self-pay

## 2024-02-24 ENCOUNTER — Other Ambulatory Visit (HOSPITAL_BASED_OUTPATIENT_CLINIC_OR_DEPARTMENT_OTHER): Payer: Self-pay

## 2024-02-28 ENCOUNTER — Other Ambulatory Visit: Payer: Self-pay

## 2024-02-29 ENCOUNTER — Other Ambulatory Visit: Payer: Self-pay

## 2024-03-01 ENCOUNTER — Encounter (INDEPENDENT_AMBULATORY_CARE_PROVIDER_SITE_OTHER): Payer: Self-pay

## 2024-03-01 ENCOUNTER — Other Ambulatory Visit (HOSPITAL_COMMUNITY): Payer: Self-pay

## 2024-03-01 ENCOUNTER — Other Ambulatory Visit: Payer: Self-pay

## 2024-03-01 NOTE — Progress Notes (Signed)
 Specialty Pharmacy Refill Coordination Note  MyChart Questionnaire Submission  Joshua Zhang is a 53 y.o. male contacted today regarding refills of specialty medication(s) Biktarvy .  Doses on hand: (Patient-Rptd) 15   Injection date: (Patient-Rptd) 2068/08/27  Patient requested: (Patient-Rptd) Delivery   Delivery date: 03/07/24  Verified address: [address] [place] [address]  Medication will be filled on 03/06/24.

## 2024-03-06 ENCOUNTER — Other Ambulatory Visit: Payer: Self-pay

## 2024-03-09 ENCOUNTER — Other Ambulatory Visit: Payer: Self-pay

## 2024-03-09 NOTE — Progress Notes (Signed)
 Specialty Pharmacy Ongoing Clinical Assessment Note  Joshua Zhang is a 53 y.o. male who is being followed by the specialty pharmacy service for RxSp HIV   Patient's specialty medication(s) reviewed today: Bictegravir-Emtricitab-Tenofov (Biktarvy )   Missed doses in the last 4 weeks: 0   Patient/Caregiver did not have any additional questions or concerns.   Therapeutic benefit summary: Patient is achieving benefit   Adverse events/side effects summary: No adverse events/side effects   Patient's therapy is appropriate to: Continue    Goals Addressed             This Visit's Progress    Achieve Undetectable HIV Viral Load < 20   On track    Patient is on track. Patient will maintain adherence. Patient been undetectable since 12/24/2020.         Follow up: 12 months  Powell CHRISTELLA Gallus Specialty Pharmacist

## 2024-04-04 ENCOUNTER — Other Ambulatory Visit: Payer: Self-pay

## 2024-04-04 ENCOUNTER — Other Ambulatory Visit: Payer: Self-pay | Admitting: Pharmacy Technician

## 2024-04-04 ENCOUNTER — Encounter (INDEPENDENT_AMBULATORY_CARE_PROVIDER_SITE_OTHER): Payer: Self-pay

## 2024-04-04 NOTE — Progress Notes (Signed)
 Specialty Pharmacy Refill Coordination Note  Joshua Zhang is a 53 y.o. male contacted today regarding refills of specialty medication(s) Bictegravir-Emtricitab-Tenofov (Biktarvy )   Patient requested Delivery   Delivery date: 04/10/24   Verified address: [address] [place] [address]   Medication will be filled on 04/07/24. Questionnaire answered.

## 2024-04-07 ENCOUNTER — Other Ambulatory Visit: Payer: Self-pay

## 2024-04-27 ENCOUNTER — Other Ambulatory Visit: Payer: Self-pay

## 2024-04-28 ENCOUNTER — Other Ambulatory Visit: Payer: Self-pay

## 2024-04-28 NOTE — Progress Notes (Signed)
 Specialty Pharmacy Refill Coordination Note  Joshua Zhang is a 53 y.o. male contacted today regarding refills of specialty medication(s) Biktarvy   Patient requested (Patient-Rptd) Delivery   Delivery date: 05/04/24 Verified address: (Patient-Rptd) [address] [place] [address]   Medication will be filled on 05/03/24

## 2024-05-03 ENCOUNTER — Other Ambulatory Visit: Payer: Self-pay

## 2024-05-22 ENCOUNTER — Ambulatory Visit
Admission: RE | Admit: 2024-05-22 | Discharge: 2024-05-22 | Disposition: A | Source: Ambulatory Visit | Attending: Emergency Medicine | Admitting: Emergency Medicine

## 2024-05-22 DIAGNOSIS — R918 Other nonspecific abnormal finding of lung field: Secondary | ICD-10-CM

## 2024-05-25 ENCOUNTER — Other Ambulatory Visit (HOSPITAL_COMMUNITY): Payer: Self-pay

## 2024-05-26 ENCOUNTER — Ambulatory Visit: Payer: Self-pay | Admitting: Emergency Medicine

## 2024-05-29 ENCOUNTER — Other Ambulatory Visit: Payer: Self-pay

## 2024-05-30 ENCOUNTER — Other Ambulatory Visit: Payer: Self-pay

## 2024-05-30 ENCOUNTER — Encounter (INDEPENDENT_AMBULATORY_CARE_PROVIDER_SITE_OTHER): Payer: Self-pay

## 2024-05-30 ENCOUNTER — Other Ambulatory Visit (HOSPITAL_COMMUNITY): Payer: Self-pay

## 2024-05-30 NOTE — Progress Notes (Signed)
 Specialty Pharmacy Refill Coordination Note  MyChart Questionnaire Submission  Joshua Zhang is a 53 y.o. male contacted today regarding refills of specialty medication(s) Biktarvy .  Doses on hand: (Patient-Rptd) 15   Patient requested: (Patient-Rptd) Delivery   Delivery date: 06/01/24  Verified address: [address] [place] [address]  Medication will be filled on 05/31/24.

## 2024-06-02 NOTE — Progress Notes (Signed)
 ATC x1. Went straight to vm. LMTCB. Called and spoke with the pts wife and pt has been scheduled for 11/20. Pt also has appt with Oncology 11/18. Nothing further needed.

## 2024-06-05 ENCOUNTER — Other Ambulatory Visit: Payer: Self-pay

## 2024-06-05 DIAGNOSIS — C20 Malignant neoplasm of rectum: Secondary | ICD-10-CM

## 2024-06-06 ENCOUNTER — Inpatient Hospital Stay: Payer: BC Managed Care – PPO | Admitting: Hematology

## 2024-06-06 ENCOUNTER — Inpatient Hospital Stay: Admitting: Nurse Practitioner

## 2024-06-06 ENCOUNTER — Inpatient Hospital Stay: Attending: Hematology

## 2024-06-06 ENCOUNTER — Inpatient Hospital Stay: Payer: BC Managed Care – PPO

## 2024-06-06 VITALS — BP 146/82 | HR 90 | Temp 98.0°F | Resp 17 | Wt 212.0 lb

## 2024-06-06 DIAGNOSIS — K8020 Calculus of gallbladder without cholecystitis without obstruction: Secondary | ICD-10-CM | POA: Diagnosis not present

## 2024-06-06 DIAGNOSIS — C19 Malignant neoplasm of rectosigmoid junction: Secondary | ICD-10-CM | POA: Insufficient documentation

## 2024-06-06 DIAGNOSIS — D125 Benign neoplasm of sigmoid colon: Secondary | ICD-10-CM | POA: Diagnosis not present

## 2024-06-06 DIAGNOSIS — D122 Benign neoplasm of ascending colon: Secondary | ICD-10-CM | POA: Insufficient documentation

## 2024-06-06 DIAGNOSIS — C20 Malignant neoplasm of rectum: Secondary | ICD-10-CM

## 2024-06-06 DIAGNOSIS — D124 Benign neoplasm of descending colon: Secondary | ICD-10-CM | POA: Diagnosis not present

## 2024-06-06 DIAGNOSIS — Z9049 Acquired absence of other specified parts of digestive tract: Secondary | ICD-10-CM | POA: Insufficient documentation

## 2024-06-06 DIAGNOSIS — Z9221 Personal history of antineoplastic chemotherapy: Secondary | ICD-10-CM | POA: Insufficient documentation

## 2024-06-06 DIAGNOSIS — I700 Atherosclerosis of aorta: Secondary | ICD-10-CM | POA: Diagnosis not present

## 2024-06-06 DIAGNOSIS — R918 Other nonspecific abnormal finding of lung field: Secondary | ICD-10-CM

## 2024-06-06 DIAGNOSIS — D696 Thrombocytopenia, unspecified: Secondary | ICD-10-CM | POA: Insufficient documentation

## 2024-06-06 DIAGNOSIS — D123 Benign neoplasm of transverse colon: Secondary | ICD-10-CM | POA: Diagnosis not present

## 2024-06-06 DIAGNOSIS — Z79899 Other long term (current) drug therapy: Secondary | ICD-10-CM | POA: Insufficient documentation

## 2024-06-06 DIAGNOSIS — K760 Fatty (change of) liver, not elsewhere classified: Secondary | ICD-10-CM | POA: Diagnosis not present

## 2024-06-06 LAB — CBC WITH DIFFERENTIAL (CANCER CENTER ONLY)
Abs Immature Granulocytes: 0.02 K/uL (ref 0.00–0.07)
Basophils Absolute: 0 K/uL (ref 0.0–0.1)
Basophils Relative: 1 %
Eosinophils Absolute: 0.1 K/uL (ref 0.0–0.5)
Eosinophils Relative: 2 %
HCT: 41.7 % (ref 39.0–52.0)
Hemoglobin: 15.6 g/dL (ref 13.0–17.0)
Immature Granulocytes: 0 %
Lymphocytes Relative: 23 %
Lymphs Abs: 1.2 K/uL (ref 0.7–4.0)
MCH: 35.2 pg — ABNORMAL HIGH (ref 26.0–34.0)
MCHC: 37.4 g/dL — ABNORMAL HIGH (ref 30.0–36.0)
MCV: 94.1 fL (ref 80.0–100.0)
Monocytes Absolute: 0.4 K/uL (ref 0.1–1.0)
Monocytes Relative: 8 %
Neutro Abs: 3.5 K/uL (ref 1.7–7.7)
Neutrophils Relative %: 66 %
Platelet Count: 177 K/uL (ref 150–400)
RBC: 4.43 MIL/uL (ref 4.22–5.81)
RDW: 11.6 % (ref 11.5–15.5)
WBC Count: 5.3 K/uL (ref 4.0–10.5)
nRBC: 0 % (ref 0.0–0.2)

## 2024-06-06 LAB — CMP (CANCER CENTER ONLY)
ALT: 40 U/L (ref 0–44)
AST: 37 U/L (ref 15–41)
Albumin: 4.2 g/dL (ref 3.5–5.0)
Alkaline Phosphatase: 76 U/L (ref 38–126)
Anion gap: 6 (ref 5–15)
BUN: 16 mg/dL (ref 6–20)
CO2: 28 mmol/L (ref 22–32)
Calcium: 9.1 mg/dL (ref 8.9–10.3)
Chloride: 105 mmol/L (ref 98–111)
Creatinine: 1.09 mg/dL (ref 0.61–1.24)
GFR, Estimated: >60 mL/min (ref >60)
Glucose, Bld: 95 mg/dL (ref 70–99)
Potassium: 4 mmol/L (ref 3.5–5.1)
Sodium: 139 mmol/L (ref 135–145)
Total Bilirubin: 1.3 mg/dL — ABNORMAL HIGH (ref 0.0–1.2)
Total Protein: 6.6 g/dL (ref 6.5–8.1)

## 2024-06-06 LAB — CEA (ACCESS): CEA (CHCC): 1.62 ng/mL (ref 0.00–5.00)

## 2024-06-06 NOTE — Progress Notes (Unsigned)
 [facility]   Telephone:(336) [phone] Fax:(336) [phone]    Patient Care Team: [name] as PCP - General (Physician Assistant) Debby Hila, MD as Consulting Physician (General Surgery) Lanny Callander, MD as Consulting Physician (Hematology) Dewey Rush, MD as Consulting Physician (Radiation Oncology)  Date of Service: 06/06/2024  CHIEF COMPLAINT: Follow up rectal cancer   Oncology History Overview Note  Cancer Staging Rectal adenocarcinoma [facility]) Staging form: Colon and Rectum, AJCC 8th Edition - Clinical stage from 08/21/2019: Stage IIIB (cT3, cN1, cM0) - Signed by Lanny Callander, MD on 08/21/2019 Stage prefix: Initial diagnosis Histologic grade (G): G2 Histologic grading system: 4 grade system    Rectal adenocarcinoma (HCC)  07/03/2019 Procedure   Colonoscopy by Dr. Kristie  IMPRESSION -Likely Malignant 5cm, circumferential, bleeding tumor in the rectum, 304 cm from the anal verge -biopsy done  -One 8 mm sessile polyp in the distal sigmoid colon, removed with a hot snare x2; resected and retrieved.  -Two small sessile polyps, 1 in the mid-descending colon and 1 in the mid transverse colon - removed by cold biopsies.  -One 7mm sessile polyp in the mid transverse colon, removed with a hot snare x1; resected and retrieved.  -One 8mm sessile polyp in the proximal ascending colon, removed with a hot snare X1; resected and retrieved.  -few large scattered diverticula.   07/03/2019 Initial Biopsy   FINAL DIAGNOSIS 07/03/19  A. Colon, Descending, Polyp, Polypectomy:   -TUBULAR ADENOMA   -No high grade dysplasia or malignancy.  B. Colon, transverse, polyp, polypectomy:   -TUBULAR ADENOMA  -No high grade dysplasia or malignancy. C. Colon, Ascending, Polyp, Polypectomy:   -Fragments of sessile serrated adenoma/polyp D. Colon, sigmoid, polyp, polypectomy:   -Fragments of Tubular Adenoma  -No high grade dysplasia or malignancy. E. Rectum, Mass, Biopsy:    -INVASIVE COLONIC ADENOCARCINOMA, MODERATELY-DIFFERENTIATED, see comment     07/12/2019 Imaging   CT AP W Contrast 07/12/19  IMPRESSION: 1. No evidence of metastatic disease within the chest, abdomen or pelvis. 2. Possible mild wall thickening in the sigmoid colon, although no focal colonic mass is identified. There are few distal colonic diverticula. 3. Sequela of prior granulomatous disease. 4. Possible cholelithiasis with mild wall thickening of the gallbladder fundus. 5. Aortic Atherosclerosis (ICD10-I70.0).   08/21/2019 Initial Diagnosis   Rectal adenocarcinoma (HCC)   08/21/2019 Cancer Staging   Staging form: Colon and Rectum, AJCC 8th Edition - Clinical stage from 08/21/2019: Stage IIIB (cT3, cN1, cM0) - Signed by Lanny Callander, MD on 08/21/2019   08/31/2019 - 12/07/2019 Chemotherapy   FOLFOX q2weeks starting 08/31/19. Removed 5FU bolus and increased steroids pre-meds due to thrombocytopenia and skin rash starting with C4. Completed 12/07/19    11/21/2019 Imaging   CT AP W contrast  IMPRESSION: 1. Stable exam. No evidence for metastatic disease within the abdomen or pelvis. 2. Splenomegaly.  New from previous exam. 3. Gallstones.   Aortic Atherosclerosis (ICD10-I70.0).   12/25/2019 - 02/01/2020 Chemotherapy   Concurrent chemoRT with Xeloda  2000mg  in the AM and 1500mg  in the PM on days of RT  12/25/19-7/15/2   12/25/2019 - 02/01/2020 Radiation Therapy   Concurrent  chemoRT with Dr Dewey 12/25/19-02/01/20   04/03/2020 Surgery   XI ROBOTIC ASSISTED LOWER ANTERIOR RESECTION, SPLENIC FLEXURE MOBILIZATION, RIGID PROCTOSCOPY and DIVERTING LOOP ILEOSTOMY with Dr Georgean and Dr Sheldon   04/03/2020 Pathology Results   FINAL MICROSCOPIC DIAGNOSIS:   A. RECTOSIGMOID COLON, LOW ANTERIOR RESECTION:  - No residual invasive  carcinoma status post neoadjuvant treatment.  - See oncology table.   B. FINAL DISTAL MARGIN, EXCISION:  - Unremarkable colonic mucosa.    06/12/2020 Surgery   ILEOSTOMY  REVERSAL and PAC removal by Dr Debby    11/26/2020 Imaging   CT C/A/P  IMPRESSION: 1. Changes of prior low anterior resection. Soft tissue nodularity in the presacral space/mesorectal fat, which are nonspecific and may reflect post treatment/surgical changes but disease involvement not excluded. Attention on close interval follow-up exams is recommended. 2. No evidence of metastatic disease within the chest. 3. Similar splenomegaly. 4. Cholelithiasis without evidence of acute cholecystitis. 5. Aortic atherosclerosis.   02/26/2021 Imaging   IMPRESSION: 1. Status post low anterior resection. Nodularity in the presacral space is stable to minimally increased in the interval. Continued close attention recommended as recurrent disease not excluded. 2. Cholelithiasis. 3. Aortic Atherosclerosis (ICD10-I70.0).   03/21/2021 PET scan   IMPRESSION: 1. Small presacral nodule which appears minimally more conspicuous since May 2022, measuring 9 mm today compared to 6 mm than shows low level hypermetabolism. Continued close attention on follow-up recommended as recurrent/metastatic disease is a concern. 2. Hypermetabolic FDG uptake in the anal region likely physiologic. Attention on follow-up recommended. 3. No evidence for distant metastases in the chest, abdomen, or pelvis. 4. Hepatic steatosis. 5. Cholelithiasis. 6.  Aortic Atherosclerois (ICD10-170.0)   04/29/2021 Pathology Results   FINAL MICROSCOPIC DIAGNOSIS:   A. LYMPH NODE, PRE-SACRAL, BIOPSY:  - Reactive lymphoid hyperplasia  - No metastatic carcinoma identified    08/19/2021 Imaging   EXAM: CT ABDOMEN AND PELVIS WITH CONTRAST  IMPRESSION: 1. No new suspicious mass or lymphadenopathy identified in the abdomen or pelvis. 2. Postsurgical changes of the lower rectum. Decreased size of presacral nodular densities measuring up to 6 mm in short axis. 3. Colonic diverticulosis. 4. Other ancillary findings as described.       CURRENT THERAPY: Observation/surveillance   INTERVAL HISTORY Mr. Edmondson returns for follow up as scheduled. Last seen by Dr. Lanny 06/07/23.  Doing well overall with no significant changes in his health.  Had a colonoscopy this spring that was clear.  Denies changes in bowel habits, rectal pain or bleeding, unintentional weight loss, cough, chest pain, dyspnea, or any other new or specific complaints.  Notes that he drinks alcohol daily.  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    Port-A-Cath in place 10/12/2019   rectal ca dx'd 06/2019   Renal insufficiency      Past Surgical History:  Procedure Laterality Date   APPENDECTOMY     53 years old    DIVERTING ILEOSTOMY N/A 04/03/2020   Procedure: DIVERTING LOOP ILEOSTOMY;  Surgeon: Debby Hila, MD;  Location: [facility];  Service: General;  Laterality: N/A;   FINGER SURGERY Right    ILEOSTOMY CLOSURE N/A 06/12/2020   Procedure: ILEOSTOMY REVERSAL;  Surgeon: Debby Hila, MD;  Location: [facility];  Service: General;  Laterality: N/A;   INCISIONAL HERNIA REPAIR N/A 09/26/2020   Procedure: LAPAROSCOPIC INCISIONAL HERNIA REPAIR WITH MESH;  Surgeon: Debby Hila, MD;  Location: [facility];  Service: General;  Laterality: N/A;   IR IMAGING GUIDED PORT INSERTION  08/30/2019   PORT-A-CATH REMOVAL Right 06/12/2020   Procedure: REMOVAL PORT-A-CATH;  Surgeon: Debby Hila, MD;  Location: [facility];  Service: General;  Laterality: Right;   SHOULDER SURGERY Bilateral 53 years old   WISDOM TOOTH EXTRACTION     XI ROBOTIC ASSISTED LOWER ANTERIOR RESECTION N/A 04/03/2020   Procedure: XI ROBOTIC ASSISTED LOWER ANTERIOR RESECTION, SPLENIC FLEXURE MOBILIZATION, RIGID PROCTOSCOPY;  Surgeon: Debby Hila, MD;  Location: [facility];  Service: General;  Laterality: N/A;      Outpatient Encounter Medications as of 06/06/2024  Medication Sig   amLODipine  (NORVASC ) 5 MG tablet Take 5 mg by mouth daily.   atorvastatin  (LIPITOR) 40 MG tablet Take 1 tablet (40 mg total) by mouth daily.   bictegravir-emtricitabine -tenofovir  AF (BIKTARVY ) 50-200-25 MG TABS tablet TAKE 1 TABLET BY MOUTH DAILY.   losartan  (COZAAR ) 25 MG tablet Take 25 mg by mouth daily.    Multiple Vitamin (MULTIVITAMIN) tablet Take 1 tablet by mouth daily.   No facility-administered encounter medications on file as of 06/06/2024.     Today's Vitals   06/06/24 1128 06/06/24 1135 06/06/24 1223  BP: (!) 156/98 (!) 146/82   Pulse: 90    Resp: 17    Temp: 98 F (36.7 C)    SpO2: 98%    Weight: 212 lb (96.2 kg)    PainSc:   0-No pain   Body mass index is 30.42 kg/m.   ECOG PERFORMANCE STATUS: 0 - Asymptomatic  PHYSICAL EXAM GENERAL:alert, no distress and comfortable SKIN: no rash  EYES: sclera clear NECK: without mass LYMPH:  no palpable cervical or supraclavicular lymphadenopathy  LUNGS: clear with normal breathing effort HEART: regular rate & rhythm, no lower extremity edema ABDOMEN: abdomen soft, non-tender and normal bowel sounds. No hepatomegaly or palpable mass NEURO: alert & oriented x 3 with fluent speech, no focal motor/sensory deficits Rectal exam: declined  CBC    Latest Ref Rng & Units 06/06/2024   10:51 AM 06/03/2023    8:30 AM 12/07/2022   10:53 AM  CBC  WBC 4.0 - 10.5 K/uL 5.3  4.7  5.8   Hemoglobin 13.0 - 17.0 g/dL 84.3  83.1  85.6   Hematocrit 39.0 - 52.0 % 41.7  46.3  39.0   Platelets 150 - 400 K/uL 177  201  195       CMP     Latest Ref Rng & Units 06/06/2024   10:51 AM 06/03/2023    8:30 AM 12/15/2022    9:00 AM  CMP  Glucose 70 - 99 mg/dL 95  97  893   BUN 6 - 20 mg/dL 16  9  15    Creatinine 0.61 - 1.24 mg/dL 8.90  8.94  8.96   Sodium 135 - 145 mmol/L 139  140  139   Potassium 3.5 - 5.1 mmol/L 4.0  4.0  4.0   Chloride 98 - 111 mmol/L 105  108  106    CO2 22 - 32 mmol/L 28  26  25    Calcium  8.9 - 10.3 mg/dL 9.1  9.2  9.3   Total Protein 6.5 - 8.1 g/dL 6.6  6.8  6.3   Total Bilirubin 0.0 - 1.2 mg/dL 1.3  1.0  0.7   Alkaline Phos 38 - 126 U/L 76  77    AST 15 - 41 U/L 37  25  29   ALT 0 - 44 U/L 40  29  38       ASSESSMENT & PLAN:  Rectal adenocarcinoma cT3N1M0, Stage IIIB, MMR normal, ypT0N0 -low rectum mass found on screening colonoscopy in 06/2019. Biopsy showed moderately differentiated adenocarcinoma. Staged  cT3N1 by MRI -s/p 8 cycles neoadjuvant FOLFOX, then concurrent  chemoRT with Xeloda .  -s/p surgery with Dr Debby on 04/03/20, path showed complete response with no residual carcinoma. Ileostomy takedown 06/12/20 (which caused hernia requiring repair) -surveillance colonoscopy 09/06/20 with Dr. Kristie was benign.  Reportedly had repeat Spring 2025 that was clear, 3-year recall  -Serial imaging showed no local recurrence, stable to minimally enlarging lung nodules. Followed by Dr. Shelah -Mr. Osmond is clinically doing well from a colorectal cancer standpoint. Exam is bening, colonoscopy reportedly normal this year, labs are unremarkable except slightly elevated bilirubin. I -He is being followed for enlarging lung nodules, I think a PET scan is reasonable, lesions are small/borderline between 8 - 10 mm. Will proceed with PET scan, pt to see Dr. Shelah this week.  May discuss in thoracic tumor board if needed -F/up pending results of lung nodule work up  Elevated bilirubin -Possibly from medication or daily alcohol -Exam is benign, ALT/AST/alk phos normal -Encouraged to cut back alcohol   PLAN: -Labs reviewed -Will request colonoscopy report from Dr. Kristie -PET scan asap, F/up Dr Shelah 11/20 as scheduled  -F/up pending results   Orders Placed This Encounter  Procedures   NM PET Image Restag (PS) Skull Base To Thigh    Standing Status:   Future    Expected Date:   06/09/2024    Expiration Date:   06/07/2025    If  indicated for the ordered procedure, I authorize the administration of a radiopharmaceutical per Radiology protocol:   Yes    Preferred imaging location?:   [facility]      All questions were answered. The patient knows to call the clinic with any problems, questions or concerns. No barriers to learning were detected. I spent 20 minutes counseling the patient face to face. The total time spent in the appointment was 30 minutes and more than 50% was on counseling, review of test results, and coordination of care.   Ebone Alcivar K Kersti Scavone, NP 06/07/2024

## 2024-06-07 ENCOUNTER — Encounter: Payer: Self-pay | Admitting: Hematology

## 2024-06-07 ENCOUNTER — Encounter: Payer: Self-pay | Admitting: Nurse Practitioner

## 2024-06-08 ENCOUNTER — Ambulatory Visit: Admitting: Emergency Medicine

## 2024-06-08 ENCOUNTER — Telehealth: Payer: Self-pay

## 2024-06-08 ENCOUNTER — Encounter: Payer: Self-pay | Admitting: Emergency Medicine

## 2024-06-08 ENCOUNTER — Telehealth: Payer: Self-pay | Admitting: Emergency Medicine

## 2024-06-08 VITALS — BP 134/72 | HR 81 | Temp 97.7°F | Ht 70.0 in | Wt 210.0 lb

## 2024-06-08 DIAGNOSIS — R918 Other nonspecific abnormal finding of lung field: Secondary | ICD-10-CM | POA: Diagnosis not present

## 2024-06-08 NOTE — Telephone Encounter (Signed)
 Please create bronch letter per Dr Shelah.

## 2024-06-08 NOTE — H&P (View-Only) (Signed)
 Subjective:    Patient ID: Joshua Zhang, male    DOB: 07/20/1971, 53 y.o.   MRN: [id number]  HPI  ROV 06/08/2024 --follow-up visit 53 year old male with history of HIV, minimal tobacco history, rectal cancer that was treated with neoadjuvant chemotherapy and then resected (2021).  I've seen him for pulmonary nodular disease initially detected on his surveillance imaging.  We repeated his scan on 05/22/2024.  Overall he is been doing well, feeling well.  Denies any dyspnea, cough, chest discomfort  CT scan of the chest 05/22/2024 reviewed by me, shows slow enlargement in bilateral upper lobe spiculated nodules going back to 09/10/2022.  The anterior segment right upper lobe nodule is 9 x 10 mm, left upper lobe anterior segment nodule is 8 x 8 mm.  No new nodule seen.  There are scattered calcified granulomas.  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    Port-A-Cath in place 10/12/2019   rectal ca dx'd 06/2019   Renal insufficiency      Family History  Problem Relation Age of Onset   Breast cancer Mother    Aortic aneurysm Father      Social History   Socioeconomic History   Marital status: Married    Spouse name: Engineer, Manufacturing   Number of children: 1   Years of education: some college    Highest education level: Not on file  Occupational History   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   Tobacco Use   Smoking status: Former    Current packs/day: 0.00    Average packs/day: 0.3 packs/day for 5.0 years (1.3 ttl pk-yrs)    Types: Cigarettes    Start date: 51    Quit date: 2003    Years since quitting: 22.9   Smokeless tobacco: Never   Tobacco comments:    Quit 18 years ago  Vaping Use   Vaping status: Never Used   Substance and Sexual Activity   Alcohol use: Yes    Comment: 5-6 driniks per week    Drug use: Not Currently   Sexual activity: Not Currently    Partners: Female    Comment: declined condoms 12/25/21  Other Topics Concern   Not on file  Social History Narrative   Not on file   Social Drivers of Health   Financial Resource Strain: Not on file  Food Insecurity: No Food Insecurity (06/06/2024)   Hunger Vital Sign    Worried About Running Out of Food in the Last Year: Never true    Ran Out of Food in the Last Year: Never true  Transportation Needs: No Transportation Needs (06/06/2024)   PRAPARE - Administrator, Civil Service (Medical): No    Lack of Transportation (Non-Medical): No  Physical Activity: Not on file  Stress: Not on file  Social Connections: Not on file  Intimate Partner Violence: Not At Risk (06/06/2024)   Humiliation, Afraid, Rape, and Kick questionnaire    Fear of Current or Ex-Partner: No    Emotionally Abused: No    Physically Abused: No    Sexually Abused: No    From [place], has lived up and down the [address].  Former psychologist, prison and probation services, now airline pilot  No TB  exposure No mold exposure No hot tub No birds.   No Known Allergies   Outpatient Medications Prior to Visit  Medication Sig Dispense Refill   amLODipine  (NORVASC ) 5 MG tablet Take 5 mg by mouth daily.     atorvastatin  (LIPITOR) 40 MG tablet Take 1 tablet (40 mg total) by mouth daily. 30 tablet 11   bictegravir-emtricitabine -tenofovir  AF (BIKTARVY ) 50-200-25 MG TABS tablet TAKE 1 TABLET BY MOUTH DAILY. 30 tablet 5   losartan  (COZAAR ) 25 MG tablet Take 25 mg by mouth daily.      Multiple Vitamin (MULTIVITAMIN) tablet Take 1 tablet by mouth daily.     No facility-administered medications prior to visit.        Objective:   Physical Exam Vitals:   06/08/24 1304  BP: 134/72  Pulse: 81  Temp: 97.7 F (36.5 C)  SpO2: 97%  Weight: 210 lb (95.3 kg)  Height: 5' 10 (1.778 m)   Gen:  Pleasant, well-nourished, in no distress,  normal affect  ENT: No lesions,  mouth clear,  oropharynx clear, no postnasal drip  Neck: No JVD, no stridor  Lungs: No use of accessory muscles, no crackles or wheezing on normal respiration, no wheeze on forced expiration  Cardiovascular: RRR, heart sounds normal, no murmur or gallops, no peripheral edema  Musculoskeletal: No deformities, no cyanosis or clubbing  Neuro: alert, awake, non focal  Skin: Warm, no lesions or rash      Assessment & Plan:  Pulmonary nodules Reviewed his CT scan with him today.  There is been a concerning enlargement and bilateral upper lobe pulmonary nodules with some associated spiculation.  Discussed with him that the differential diagnosis includes malignancy.  I have recommended robotic assisted navigational bronchoscopy to allow biopsies and also culture data.  He understands and agrees.  I will arrange for this as soon as possible.  We will then follow-up to review results and plan next steps in therapy depending on that data.  He understands that he needs a designated driver and someone to watch at home that day after the procedure.  He is not on any anticoagulation.  We reviewed your CT scan of the chest today. We will arrange for navigational bronchoscopy to evaluate too small pulmonary nodules.  This will be done under general anesthesia as an outpatient at [facility].  You will need a designated driver and someone to watch you that day after the procedure. We will arrange follow-up here after the bronchoscopy to review results.   I personally spent a total of 64 minutes in the care of the patient today including preparing to see the patient, getting/reviewing separately obtained history, performing a medically appropriate exam/evaluation, counseling and educating, placing orders, documenting clinical information in the EHR, independently interpreting results, communicating results, and coordinating  care.   Lamar Chris, MD, PhD 06/08/2024, 5:05 PM [facility] [phone] or if no answer before 7:00PM call [phone] For any issues after 7:00PM please call eLink [phone]

## 2024-06-08 NOTE — Telephone Encounter (Signed)
 Letter give to pt by Ashlyn. Sending to [employer] to [place]

## 2024-06-08 NOTE — Assessment & Plan Note (Signed)
 Reviewed his CT scan with him today.  There is been a concerning enlargement and bilateral upper lobe pulmonary nodules with some associated spiculation.  Discussed with him that the differential diagnosis includes malignancy.  I have recommended robotic assisted navigational bronchoscopy to allow biopsies and also culture data.  He understands and agrees.  I will arrange for this as soon as possible.  We will then follow-up to review results and plan next steps in therapy depending on that data.  He understands that he needs a designated driver and someone to watch at home that day after the procedure.  He is not on any anticoagulation.  We reviewed your CT scan of the chest today. We will arrange for navigational bronchoscopy to evaluate too small pulmonary nodules.  This will be done under general anesthesia as an outpatient at [facility].  You will need a designated driver and someone to watch you that day after the procedure. We will arrange follow-up here after the bronchoscopy to review results.

## 2024-06-08 NOTE — Progress Notes (Signed)
 Subjective:    Patient ID: Joshua Zhang, male    DOB: 07/20/1971, 53 y.o.   MRN: [id number]  HPI  ROV 06/08/2024 --follow-up visit 53 year old male with history of HIV, minimal tobacco history, rectal cancer that was treated with neoadjuvant chemotherapy and then resected (2021).  I've seen him for pulmonary nodular disease initially detected on his surveillance imaging.  We repeated his scan on 05/22/2024.  Overall he is been doing well, feeling well.  Denies any dyspnea, cough, chest discomfort  CT scan of the chest 05/22/2024 reviewed by me, shows slow enlargement in bilateral upper lobe spiculated nodules going back to 09/10/2022.  The anterior segment right upper lobe nodule is 9 x 10 mm, left upper lobe anterior segment nodule is 8 x 8 mm.  No new nodule seen.  There are scattered calcified granulomas.  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    Port-A-Cath in place 10/12/2019   rectal ca dx'd 06/2019   Renal insufficiency      Family History  Problem Relation Age of Onset   Breast cancer Mother    Aortic aneurysm Father      Social History   Socioeconomic History   Marital status: Married    Spouse name: Engineer, Manufacturing   Number of children: 1   Years of education: some college    Highest education level: Not on file  Occupational History   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   Tobacco Use   Smoking status: Former    Current packs/day: 0.00    Average packs/day: 0.3 packs/day for 5.0 years (1.3 ttl pk-yrs)    Types: Cigarettes    Start date: 51    Quit date: 2003    Years since quitting: 22.9   Smokeless tobacco: Never   Tobacco comments:    Quit 18 years ago  Vaping Use   Vaping status: Never Used   Substance and Sexual Activity   Alcohol use: Yes    Comment: 5-6 driniks per week    Drug use: Not Currently   Sexual activity: Not Currently    Partners: Female    Comment: declined condoms 12/25/21  Other Topics Concern   Not on file  Social History Narrative   Not on file   Social Drivers of Health   Financial Resource Strain: Not on file  Food Insecurity: No Food Insecurity (06/06/2024)   Hunger Vital Sign    Worried About Running Out of Food in the Last Year: Never true    Ran Out of Food in the Last Year: Never true  Transportation Needs: No Transportation Needs (06/06/2024)   PRAPARE - Administrator, Civil Service (Medical): No    Lack of Transportation (Non-Medical): No  Physical Activity: Not on file  Stress: Not on file  Social Connections: Not on file  Intimate Partner Violence: Not At Risk (06/06/2024)   Humiliation, Afraid, Rape, and Kick questionnaire    Fear of Current or Ex-Partner: No    Emotionally Abused: No    Physically Abused: No    Sexually Abused: No    From [place], has lived up and down the [address].  Former psychologist, prison and probation services, now airline pilot  No TB  exposure No mold exposure No hot tub No birds.   No Known Allergies   Outpatient Medications Prior to Visit  Medication Sig Dispense Refill   amLODipine  (NORVASC ) 5 MG tablet Take 5 mg by mouth daily.     atorvastatin  (LIPITOR) 40 MG tablet Take 1 tablet (40 mg total) by mouth daily. 30 tablet 11   bictegravir-emtricitabine -tenofovir  AF (BIKTARVY ) 50-200-25 MG TABS tablet TAKE 1 TABLET BY MOUTH DAILY. 30 tablet 5   losartan  (COZAAR ) 25 MG tablet Take 25 mg by mouth daily.      Multiple Vitamin (MULTIVITAMIN) tablet Take 1 tablet by mouth daily.     No facility-administered medications prior to visit.        Objective:   Physical Exam Vitals:   06/08/24 1304  BP: 134/72  Pulse: 81  Temp: 97.7 F (36.5 C)  SpO2: 97%  Weight: 210 lb (95.3 kg)  Height: 5' 10 (1.778 m)   Gen:  Pleasant, well-nourished, in no distress,  normal affect  ENT: No lesions,  mouth clear,  oropharynx clear, no postnasal drip  Neck: No JVD, no stridor  Lungs: No use of accessory muscles, no crackles or wheezing on normal respiration, no wheeze on forced expiration  Cardiovascular: RRR, heart sounds normal, no murmur or gallops, no peripheral edema  Musculoskeletal: No deformities, no cyanosis or clubbing  Neuro: alert, awake, non focal  Skin: Warm, no lesions or rash      Assessment & Plan:  Pulmonary nodules Reviewed his CT scan with him today.  There is been a concerning enlargement and bilateral upper lobe pulmonary nodules with some associated spiculation.  Discussed with him that the differential diagnosis includes malignancy.  I have recommended robotic assisted navigational bronchoscopy to allow biopsies and also culture data.  He understands and agrees.  I will arrange for this as soon as possible.  We will then follow-up to review results and plan next steps in therapy depending on that data.  He understands that he needs a designated driver and someone to watch at home that day after the procedure.  He is not on any anticoagulation.  We reviewed your CT scan of the chest today. We will arrange for navigational bronchoscopy to evaluate too small pulmonary nodules.  This will be done under general anesthesia as an outpatient at [facility].  You will need a designated driver and someone to watch you that day after the procedure. We will arrange follow-up here after the bronchoscopy to review results.   I personally spent a total of 64 minutes in the care of the patient today including preparing to see the patient, getting/reviewing separately obtained history, performing a medically appropriate exam/evaluation, counseling and educating, placing orders, documenting clinical information in the EHR, independently interpreting results, communicating results, and coordinating  care.   Lamar Chris, MD, PhD 06/08/2024, 5:05 PM [facility] [phone] or if no answer before 7:00PM call [phone] For any issues after 7:00PM please call eLink [phone]

## 2024-06-08 NOTE — Telephone Encounter (Signed)
 Please schedule the following:  Provider performing procedure: Tyreesha Maharaj Diagnosis: Bilateral upper lobe pulmonary nodules Which side if for nodule / mass?  Bilateral Procedure: Robotic assisted navigational bronchoscopy Has patient been spoken to by Provider and given informed consent?  Yes Anesthesia: General Do you need Fluro?  Yes Duration of procedure: 60 minutes Date: 06/13/2024 Alternate Date: 06/20/2024 Time: Any Location: [facility] Does patient have OSA?  No DM?  No or Latex allergy?  No Medication Restriction/ Anticoagulate/Antiplatelet: None Pre-op Labs Ordered:determined by Anesthesia Imaging request: CT chest is already available (If, SuperDimension CT Chest, please have STAT courier sent to ENDO)

## 2024-06-08 NOTE — Telephone Encounter (Signed)
 Done NFN

## 2024-06-08 NOTE — Patient Instructions (Addendum)
 We reviewed your CT scan of the chest today. We will arrange for navigational bronchoscopy to evaluate too small pulmonary nodules.  This will be done under general anesthesia as an outpatient at [facility].  You will need a designated driver and someone to watch you that day after the procedure. We will arrange follow-up here after the bronchoscopy to review results.

## 2024-06-14 ENCOUNTER — Encounter (HOSPITAL_COMMUNITY): Payer: Self-pay | Admitting: Emergency Medicine

## 2024-06-14 ENCOUNTER — Other Ambulatory Visit: Payer: Self-pay

## 2024-06-14 NOTE — Progress Notes (Signed)
 SDW CALL  Patient was given pre-op instructions over the phone. The opportunity was given for the patient to ask questions. No further questions asked. Patient verbalized understanding of instructions given.  Date& arrival time- June 20, 2024 @ 10:45 am.  PCP - Jesus, Elberta Gainer, NP   Cardiologist -  Oncology - Burton, Lacie K, NP   PPM/ICD - denies Device Orders - n/a Rep Notified - n/a  Chest x-ray - denies EKG - DOS Stress Test - denies ECHO - denies Cardiac Cath - denies  Sleep Study - denies CPAP - n/a  Dm -denies  Blood Thinner Instructions:denies Aspirin Instructions:denies  ERAS Protcol - NPO  COVID TEST- n/a  Anesthesia review: no  Patient denies shortness of breath, fever, cough and chest pain over the phone call   All instructions explained to the patient, with a verbal understanding of the material. Patient agrees to go over the instructions while at home for a better understanding.

## 2024-06-20 ENCOUNTER — Ambulatory Visit (HOSPITAL_COMMUNITY)
Admission: RE | Admit: 2024-06-20 | Discharge: 2024-06-20 | Disposition: A | Discharge status: Home / Self Care | Attending: Emergency Medicine | Admitting: Emergency Medicine

## 2024-06-20 ENCOUNTER — Other Ambulatory Visit: Payer: Self-pay

## 2024-06-20 ENCOUNTER — Ambulatory Visit (HOSPITAL_COMMUNITY): Admitting: Anesthesiology

## 2024-06-20 ENCOUNTER — Encounter (HOSPITAL_COMMUNITY)
Admission: RE | Disposition: A | Discharge status: Home / Self Care | Payer: Self-pay | Source: Home / Self Care | Attending: Emergency Medicine

## 2024-06-20 ENCOUNTER — Encounter (HOSPITAL_COMMUNITY): Payer: Self-pay | Admitting: Emergency Medicine

## 2024-06-20 ENCOUNTER — Ambulatory Visit (HOSPITAL_COMMUNITY)

## 2024-06-20 DIAGNOSIS — R918 Other nonspecific abnormal finding of lung field: Secondary | ICD-10-CM | POA: Diagnosis present

## 2024-06-20 DIAGNOSIS — C20 Malignant neoplasm of rectum: Secondary | ICD-10-CM

## 2024-06-20 HISTORY — PX: FIDUCIAL MARKER PLACEMENT: SHX6858

## 2024-06-20 HISTORY — PX: BRONCHIAL NEEDLE ASPIRATION BIOPSY: SHX5106

## 2024-06-20 HISTORY — PX: CRYOTHERAPY: SHX6894

## 2024-06-20 HISTORY — PX: VIDEO BRONCHOSCOPY WITH ENDOBRONCHIAL NAVIGATION: SHX6175

## 2024-06-20 SURGERY — VIDEO BRONCHOSCOPY WITH ENDOBRONCHIAL NAVIGATION
Anesthesia: General | Laterality: Bilateral

## 2024-06-20 MED ORDER — LACTATED RINGERS IV SOLN: INTRAVENOUS | Status: DC

## 2024-06-20 MED ORDER — ROCURONIUM BROMIDE 10 MG/ML (PF) SYRINGE
PREFILLED_SYRINGE | INTRAVENOUS | Status: DC | PRN
  Administered 2024-06-20: 20 mg via INTRAVENOUS
  Administered 2024-06-20: 60 mg via INTRAVENOUS

## 2024-06-20 MED ORDER — OXYCODONE HCL 5 MG PO TABS: 5.0000 mg | ORAL_TABLET | Freq: Once | ORAL | Status: DC | PRN

## 2024-06-20 MED ORDER — HYDROMORPHONE HCL 1 MG/ML IJ SOLN: 0.2500 mg | INTRAMUSCULAR | Status: DC | PRN

## 2024-06-20 MED ORDER — OXYCODONE HCL 5 MG/5ML PO SOLN: 5.0000 mg | Freq: Once | ORAL | Status: DC | PRN

## 2024-06-20 MED ORDER — CHLORHEXIDINE GLUCONATE 0.12 % MT SOLN
OROMUCOSAL | Status: DC
  Filled 2024-06-20: qty 15

## 2024-06-20 MED ORDER — SUGAMMADEX SODIUM 200 MG/2ML IV SOLN
INTRAVENOUS | Status: DC | PRN
  Administered 2024-06-20: 200 mg via INTRAVENOUS

## 2024-06-20 MED ORDER — CHLORHEXIDINE GLUCONATE 0.12 % MT SOLN
15.0000 mL | Freq: Once | OROMUCOSAL | Status: AC
  Administered 2024-06-20: 15 mL via OROMUCOSAL

## 2024-06-20 MED ORDER — AMISULPRIDE (ANTIEMETIC) 5 MG/2ML IV SOLN: 10.0000 mg | Freq: Once | INTRAVENOUS | Status: DC | PRN

## 2024-06-20 MED ORDER — LIDOCAINE 2% (20 MG/ML) 5 ML SYRINGE
INTRAMUSCULAR | Status: DC | PRN
  Administered 2024-06-20: 100 mg via INTRAVENOUS

## 2024-06-20 MED ORDER — ONDANSETRON HCL 4 MG/2ML IJ SOLN
INTRAMUSCULAR | Status: DC | PRN
  Administered 2024-06-20: 4 mg via INTRAVENOUS

## 2024-06-20 MED ORDER — PROPOFOL 10 MG/ML IV BOLUS
INTRAVENOUS | Status: DC | PRN
  Administered 2024-06-20: 100 ug/kg/min via INTRAVENOUS
  Administered 2024-06-20: 200 mg via INTRAVENOUS

## 2024-06-20 SURGICAL SUPPLY — 37 items
ADAPTER BRONCHOSCOPE OLYMPUS (ADAPTER) ×2 IMPLANT
ADAPTER VALVE BIOPSY EBUS (MISCELLANEOUS) IMPLANT
BAG COUNTER SPONGE SURGICOUNT (BAG) ×2 IMPLANT
BRUSH CYTOL CELLEBRITY 1.5X140 (MISCELLANEOUS) ×2 IMPLANT
BRUSH SUPERTRAX BIOPSY (INSTRUMENTS) IMPLANT
BRUSH SUPERTRAX NDL-TIP CYTO (INSTRUMENTS) ×2 IMPLANT
CANISTER SUCTION 3000ML PPV (SUCTIONS) ×2 IMPLANT
CNTNR URN SCR LID CUP LEK RST (MISCELLANEOUS) ×2 IMPLANT
COVER BACK TABLE 60X90IN (DRAPES) ×2 IMPLANT
FILTER STRAW FLUID ASPIR (MISCELLANEOUS) IMPLANT
FORCEPS BIOP 1.5 SINGLE USE (MISCELLANEOUS) ×2 IMPLANT
FORCEPS BIOP SUPERTRX PREMAR (INSTRUMENTS) ×2 IMPLANT
GAUZE SPONGE 4X4 12PLY STRL (GAUZE/BANDAGES/DRESSINGS) ×2 IMPLANT
GLOVE BIO SURGEON STRL SZ7.5 (GLOVE) ×4 IMPLANT
GOWN STRL REUS W/ TWL LRG LVL3 (GOWN DISPOSABLE) ×4 IMPLANT
KIT CLEAN ENDO COMPLIANCE (KITS) ×2 IMPLANT
KIT LOCATABLE GUIDE (CANNULA) IMPLANT
KIT MARKER FIDUCIAL DELIVERY (KITS) IMPLANT
KIT TURNOVER KIT B (KITS) ×2 IMPLANT
MARKER SKIN DUAL TIP RULER LAB (MISCELLANEOUS) ×2 IMPLANT
NDL SUPERTRX PREMARK BIOPSY (NEEDLE) ×2 IMPLANT
OIL SILICONE PENTAX (PARTS (SERVICE/REPAIRS)) ×2 IMPLANT
PAD ARMBOARD POSITIONER FOAM (MISCELLANEOUS) ×4 IMPLANT
PATCHES PATIENT (LABEL) ×6 IMPLANT
SOLN 0.9% NACL POUR BTL 1000ML (IV SOLUTION) ×2 IMPLANT
SOLN STERILE WATER BTL 1000 ML (IV SOLUTION) ×2 IMPLANT
SYR 20ML ECCENTRIC (SYRINGE) ×2 IMPLANT
SYR 20ML LL LF (SYRINGE) ×2 IMPLANT
SYR 50ML SLIP (SYRINGE) ×2 IMPLANT
SuperLock Fiducial Marker IMPLANT
SuperLock Fiducial marker IMPLANT
TOWEL GREEN STERILE FF (TOWEL DISPOSABLE) ×2 IMPLANT
TRAP SPECIMEN MUCUS 40CC (MISCELLANEOUS) IMPLANT
TUBE CONNECTING 20X1/4 (TUBING) ×2 IMPLANT
UNDERPAD 30X36 HEAVY ABSORB (UNDERPADS AND DIAPERS) ×2 IMPLANT
VALVE BIOPSY SINGLE USE (MISCELLANEOUS) ×2 IMPLANT
VALVE SUCTION BRONCHIO DISP (MISCELLANEOUS) ×2 IMPLANT

## 2024-06-20 NOTE — Op Note (Signed)
 Procedure Note  Patient: Joshua Zhang  [employer] Cios mobile C-arm was utilized to identify and biopsy right middle lobe and left upper lobe nodules.  Needle-in-lesion was confirmed using real-time Cios imaging, and images were uploaded to PACS.    Right middle lobe nodule    Left upper lobe nodule     Lamar Chris, MD, PhD 06/20/2024, 5:18 PM [facility] [phone] or if no answer before 7:00PM call [phone] For any issues after 7:00PM please call eLink [phone]

## 2024-06-20 NOTE — Interval H&P Note (Signed)
 History and Physical Interval Note:  06/20/2024 10:46 AM  Joshua Zhang  has presented today for surgery, with the diagnosis of Bilateral pulmonary nodules.  The various methods of treatment have been discussed with the patient and family. After consideration of risks, benefits and other options for treatment, the patient has consented to  Procedure(s): VIDEO BRONCHOSCOPY WITH ENDOBRONCHIAL NAVIGATION (Bilateral) as a surgical intervention.  The patient's history has been reviewed, patient examined, no change in status, stable for surgery.  I have reviewed the patient's chart and labs.  Questions were answered to the patient's satisfaction.     Lamar GORMAN Chris

## 2024-06-20 NOTE — Discharge Instructions (Signed)

## 2024-06-20 NOTE — Anesthesia Procedure Notes (Signed)
 Procedure Name: Intubation Date/Time: 06/20/2024 2:35 PM  Performed by: Kharee Lesesne A, CRNAPre-anesthesia Checklist: Patient identified, Emergency Drugs available, Suction available and Patient being monitored Patient Re-evaluated:Patient Re-evaluated prior to induction Oxygen Delivery Method: Circle System Utilized Preoxygenation: Pre-oxygenation with 100% oxygen Induction Type: IV induction Ventilation: Mask ventilation without difficulty Tube type: Oral Number of attempts: 1 Airway Equipment and Method: Stylet and Oral airway Placement Confirmation: ETT inserted through vocal cords under direct vision, positive ETCO2 and breath sounds checked- equal and bilateral Tube secured with: Tape Dental Injury: Teeth and Oropharynx as per pre-operative assessment

## 2024-06-20 NOTE — Transfer of Care (Signed)
 Immediate Anesthesia Transfer of Care Note  Patient: Joshua Zhang  Procedure(s) Performed: VIDEO BRONCHOSCOPY WITH ENDOBRONCHIAL NAVIGATION (Bilateral) BRONCHOSCOPY, WITH NEEDLE ASPIRATION BIOPSY CRYOTHERAPY INSERTION, FIDUCIAL MARKERS  Patient Location: PACU  Anesthesia Type:General  Level of Consciousness: awake, alert , and oriented  Airway & Oxygen Therapy: Patient Spontanous Breathing and Patient connected to nasal cannula oxygen  Post-op Assessment: Report given to RN and Post -op Vital signs reviewed and stable  Post vital signs: Reviewed and stable  Last Vitals:  Vitals Value Taken Time  BP    Temp    Pulse    Resp    SpO2      Last Pain:  Vitals:   06/20/24 1102  TempSrc:   PainSc: 0-No pain      Patients Stated Pain Goal: 0 (06/20/24 1102)  Complications: No notable events documented.

## 2024-06-20 NOTE — Op Note (Signed)
 Video Bronchoscopy with Robotic Assisted Bronchoscopic Navigation   Date of Operation: 06/20/2024   Pre-op Diagnosis: Right middle lobe and left upper lobe nodules  Post-op Diagnosis: Same  Surgeon: Lamar Chris  Assistants: None  Anesthesia: General endotracheal anesthesia  Operation: Flexible video fiberoptic bronchoscopy with robotic assistance and biopsies.  Estimated Blood Loss: Minimal  Complications: None  Indications and History: Joshua Zhang is a 53 y.o. male with history of rectal adenocarcinoma.  He was found to have bilateral small pulmonary nodules on surveillance imaging.  Recommendation made to achieve a tissue diagnosis via robotic assisted navigational bronchoscopy.  The risks, benefits, complications, treatment options and expected outcomes were discussed with the patient.  The possibilities of pneumothorax, pneumonia, reaction to medication, pulmonary aspiration, perforation of a viscus, bleeding, failure to diagnose a condition and creating a complication requiring transfusion or operation were discussed with the patient who freely signed the consent.    Description of Procedure: The patient was seen in the Preoperative Area, was examined and was deemed appropriate to proceed.  The patient was taken to [facility] room 3, identified as Joshua Zhang and the procedure verified as Flexible Video Fiberoptic Bronchoscopy.  A Time Out was held and the above information confirmed.   Prior to the date of the procedure a high-resolution CT scan of the chest was performed. Utilizing ION software program a virtual tracheobronchial tree was generated to allow the creation of distinct navigation pathways to the patient's parenchymal abnormalities. After being taken to the operating room general anesthesia was initiated and the patient  was orally intubated. The video fiberoptic bronchoscope was introduced via the endotracheal tube and a general inspection was performed  which showed normal right and left lung anatomy. Aspiration of the bilateral mainstems was completed to remove any remaining secretions. Robotic catheter inserted into patient's endotracheal tube.   Target #1 right middle lobe pulmonary nodule: The distinct navigation pathways prepared prior to this procedure were then utilized to navigate to patient's lesion identified on CT scan. The robotic catheter was secured into place and the vision probe was withdrawn.  Lesion location was approximated using fluoroscopy.  Local registration and targeting was performed using [employer] Cios mobile C-arm three-dimensional imaging. Under fluoroscopic guidance transbronchial needle biopsies and transbronchial cryoprobe biopsies were performed to be sent for cytology and pathology.  Needle-in-lesion was confirmed using Cios mobile C-arm.    Under fluoroscopic guidance a single fiducial marker was placed adjacent to the nodule.  Target #2 left upper lobe pulmonary nodule: The distinct navigation pathways prepared prior to this procedure were then utilized to navigate to patient's lesion identified on CT scan. The robotic catheter was secured into place and the vision probe was withdrawn.  Lesion location was approximated using fluoroscopy.  Local registration and targeting was performed using [employer] Cios mobile C-arm three-dimensional imaging. Under fluoroscopic guidance transbronchial needle biopsies and transbronchial forceps biopsies were performed to be sent for cytology and pathology.  Needle-in-lesion was confirmed using Cios mobile C-arm.  Under fluoroscopic guidance a single fiducial marker was placed adjacent to the nodule.  At the end of the procedure a general airway inspection was performed and there was no evidence of active bleeding. The bronchoscope was removed.  The patient tolerated the procedure well. There was no significant blood loss and there were no obvious complications. A  post-procedural chest x-ray is pending.  Samples Target #1: 1. Transbronchial Wang needle biopsies from right middle lobe nodule 2. Transbronchial cryoprobe biopsies from  right middle lobe nodule  Samples Target #2: 1. Transbronchial Wang needle biopsies from left upper lobe nodule 2. Transbronchial cryoprobe biopsies from left upper lobe nodule  Plans:  The patient will be discharged from the PACU to home when recovered from anesthesia and after chest x-ray is reviewed. We will review the cytology, pathology and microbiology results with the patient when they become available. Outpatient followup will be with S. Groce, NP and dr Shelah.   Lamar Shelah, MD, PhD 06/20/2024, 3:35 PM [facility] [phone] or if no answer before 7:00PM call [phone] For any issues after 7:00PM please call eLink [phone]

## 2024-06-20 NOTE — Anesthesia Preprocedure Evaluation (Signed)
 Anesthesia Evaluation  Patient identified by MRN, date of birth, ID band Patient awake    Reviewed: Allergy & Precautions, H&P , NPO status , Patient's Chart, lab work & pertinent test results  Airway Mallampati: II  TM Distance: >3 FB Neck ROM: Full    Dental  (+) Dental Advisory Given   Pulmonary neg pulmonary ROS, former smoker   Pulmonary exam normal breath sounds clear to auscultation       Cardiovascular hypertension, Pt. on medications negative cardio ROS Normal cardiovascular exam Rhythm:Regular Rate:Normal     Neuro/Psych negative neurological ROS  negative psych ROS   GI/Hepatic negative GI ROS, Neg liver ROS,,,  Endo/Other  negative endocrine ROS    Renal/GU Renal InsufficiencyRenal disease  negative genitourinary   Musculoskeletal negative musculoskeletal ROS (+)    Abdominal   Peds negative pediatric ROS (+)  Hematology  (+) HIV  Anesthesia Other Findings Lung nodule  Reproductive/Obstetrics negative OB ROS                              Anesthesia Physical Anesthesia Plan  ASA: 3  Anesthesia Plan: General   Post-op Pain Management:    Induction: Intravenous  PONV Risk Score and Plan: 2 and Propofol  infusion and Treatment may vary due to age or medical condition  Airway Management Planned: Oral ETT  Additional Equipment:   Intra-op Plan:   Post-operative Plan: Extubation in OR  Informed Consent: I have reviewed the patients History and Physical, chart, labs and discussed the procedure including the risks, benefits and alternatives for the proposed anesthesia with the patient or authorized representative who has indicated his/her understanding and acceptance.     Dental advisory given  Plan Discussed with: CRNA  Anesthesia Plan Comments:         Anesthesia Quick Evaluation

## 2024-06-21 ENCOUNTER — Encounter (HOSPITAL_COMMUNITY)
Admission: RE | Admit: 2024-06-21 | Discharge: 2024-06-21 | Disposition: A | Source: Ambulatory Visit | Attending: Nurse Practitioner

## 2024-06-21 ENCOUNTER — Encounter (HOSPITAL_COMMUNITY): Payer: Self-pay | Admitting: Emergency Medicine

## 2024-06-21 DIAGNOSIS — C20 Malignant neoplasm of rectum: Secondary | ICD-10-CM | POA: Diagnosis present

## 2024-06-21 DIAGNOSIS — R918 Other nonspecific abnormal finding of lung field: Secondary | ICD-10-CM | POA: Diagnosis present

## 2024-06-21 LAB — GLUCOSE, CAPILLARY: Glucose-Capillary: 94 mg/dL (ref 70–99)

## 2024-06-21 MED ORDER — FLUDEOXYGLUCOSE F - 18 (FDG) INJECTION
10.0000 | Freq: Once | INTRAVENOUS | Status: AC
  Administered 2024-06-21: 9.97 via INTRAVENOUS

## 2024-06-21 NOTE — Anesthesia Postprocedure Evaluation (Signed)
 Anesthesia Post Note  Patient: Joshua Zhang  Procedure(s) Performed: VIDEO BRONCHOSCOPY WITH ENDOBRONCHIAL NAVIGATION (Bilateral) BRONCHOSCOPY, WITH NEEDLE ASPIRATION BIOPSY CRYOTHERAPY INSERTION, FIDUCIAL MARKERS     Patient location during evaluation: PACU Anesthesia Type: General Level of consciousness: awake and alert Pain management: pain level controlled Vital Signs Assessment: post-procedure vital signs reviewed and stable Respiratory status: spontaneous breathing, nonlabored ventilation and respiratory function stable Cardiovascular status: blood pressure returned to baseline and stable Postop Assessment: no apparent nausea or vomiting Anesthetic complications: no   No notable events documented.  Last Vitals:  Vitals:   06/20/24 1630 06/20/24 1645  BP: (!) 145/91 (!) 151/96  Pulse: 76 77  Resp: 12 17  Temp:  36.8 C  SpO2: 94% 95%    Last Pain:  Vitals:   06/20/24 1645  TempSrc:   PainSc: 0-No pain                 Butler Levander Pinal

## 2024-06-22 ENCOUNTER — Ambulatory Visit: Admitting: Emergency Medicine

## 2024-06-23 ENCOUNTER — Encounter: Payer: Self-pay | Admitting: Nurse Practitioner

## 2024-06-23 LAB — CYTOLOGY - NON PAP

## 2024-06-28 ENCOUNTER — Ambulatory Visit: Admitting: Emergency Medicine

## 2024-06-28 ENCOUNTER — Other Ambulatory Visit: Payer: Self-pay

## 2024-06-28 ENCOUNTER — Telehealth: Payer: Self-pay

## 2024-06-28 ENCOUNTER — Ambulatory Visit: Payer: BC Managed Care – PPO | Admitting: Internal Medicine

## 2024-06-28 ENCOUNTER — Encounter: Payer: Self-pay | Admitting: Emergency Medicine

## 2024-06-28 ENCOUNTER — Inpatient Hospital Stay: Attending: Hematology | Admitting: Hematology

## 2024-06-28 VITALS — BP 136/91 | HR 81 | Temp 98.3°F | Resp 17 | Wt 213.5 lb

## 2024-06-28 VITALS — BP 143/90 | HR 86 | Temp 98.2°F | Wt 213.0 lb

## 2024-06-28 DIAGNOSIS — B20 Human immunodeficiency virus [HIV] disease: Secondary | ICD-10-CM

## 2024-06-28 DIAGNOSIS — Z23 Encounter for immunization: Secondary | ICD-10-CM

## 2024-06-28 DIAGNOSIS — I700 Atherosclerosis of aorta: Secondary | ICD-10-CM | POA: Insufficient documentation

## 2024-06-28 DIAGNOSIS — C7801 Secondary malignant neoplasm of right lung: Secondary | ICD-10-CM | POA: Insufficient documentation

## 2024-06-28 DIAGNOSIS — D122 Benign neoplasm of ascending colon: Secondary | ICD-10-CM | POA: Insufficient documentation

## 2024-06-28 DIAGNOSIS — Z9221 Personal history of antineoplastic chemotherapy: Secondary | ICD-10-CM | POA: Insufficient documentation

## 2024-06-28 DIAGNOSIS — Z79899 Other long term (current) drug therapy: Secondary | ICD-10-CM | POA: Diagnosis not present

## 2024-06-28 DIAGNOSIS — C189 Malignant neoplasm of colon, unspecified: Secondary | ICD-10-CM | POA: Diagnosis not present

## 2024-06-28 DIAGNOSIS — D124 Benign neoplasm of descending colon: Secondary | ICD-10-CM | POA: Diagnosis not present

## 2024-06-28 DIAGNOSIS — D696 Thrombocytopenia, unspecified: Secondary | ICD-10-CM | POA: Diagnosis not present

## 2024-06-28 DIAGNOSIS — D125 Benign neoplasm of sigmoid colon: Secondary | ICD-10-CM | POA: Diagnosis not present

## 2024-06-28 DIAGNOSIS — Z9049 Acquired absence of other specified parts of digestive tract: Secondary | ICD-10-CM | POA: Diagnosis not present

## 2024-06-28 DIAGNOSIS — Z923 Personal history of irradiation: Secondary | ICD-10-CM | POA: Diagnosis not present

## 2024-06-28 DIAGNOSIS — D123 Benign neoplasm of transverse colon: Secondary | ICD-10-CM | POA: Insufficient documentation

## 2024-06-28 DIAGNOSIS — K8020 Calculus of gallbladder without cholecystitis without obstruction: Secondary | ICD-10-CM | POA: Insufficient documentation

## 2024-06-28 DIAGNOSIS — C7802 Secondary malignant neoplasm of left lung: Secondary | ICD-10-CM | POA: Diagnosis not present

## 2024-06-28 DIAGNOSIS — C20 Malignant neoplasm of rectum: Secondary | ICD-10-CM | POA: Diagnosis not present

## 2024-06-28 DIAGNOSIS — Z21 Asymptomatic human immunodeficiency virus [HIV] infection status: Secondary | ICD-10-CM | POA: Diagnosis not present

## 2024-06-28 DIAGNOSIS — Z79624 Long term (current) use of inhibitors of nucleotide synthesis: Secondary | ICD-10-CM | POA: Insufficient documentation

## 2024-06-28 DIAGNOSIS — K760 Fatty (change of) liver, not elsewhere classified: Secondary | ICD-10-CM | POA: Insufficient documentation

## 2024-06-28 DIAGNOSIS — C19 Malignant neoplasm of rectosigmoid junction: Secondary | ICD-10-CM | POA: Diagnosis present

## 2024-06-28 MED ORDER — BICTEGRAVIR-EMTRICITAB-TENOFOV 50-200-25 MG PO TABS: 1.0000 | ORAL_TABLET | Freq: Every day | ORAL | 11 refills | Status: DC

## 2024-06-28 MED ORDER — BICTEGRAVIR-EMTRICITAB-TENOFOV 50-200-25 MG PO TABS
1.0000 | ORAL_TABLET | Freq: Every day | ORAL | 11 refills | Status: AC
  Filled 2024-06-28 – 2024-07-04 (×2): qty 30, 30d supply, fill #0
  Filled 2024-07-27 – 2024-08-01 (×2): qty 30, 30d supply, fill #1

## 2024-06-28 NOTE — Telephone Encounter (Signed)
 Thoracic Tumor Board Review  Quintell Bonnin was reviewed before presentation on the on the Thoracic Tumor Board on 06/29/2024. 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  Delfino Blackwell's personal history of rectal cancer at age 53 is somewhat suspicious for a hereditary cancer syndrome. His mismatch repair (MMR) tumor testing was normal which is reassuring. Yet ~10% of patient with Lynch syndrome have normal or MMR-proficient tumors. Additionally, his mother has a history of breast cancer.  Santana Fryer, MS, CGC  Certified Genetic Counselor  Email: [email or web address]  Phone: [phone]

## 2024-06-28 NOTE — Progress Notes (Signed)
 Patient Active Problem List   Diagnosis Date Noted   Pulmonary nodules 09/30/2023   Status post reversal of ileostomy 06/14/2020   Hyperglycemia 06/14/2020   Neuropathy    Hypertension    History of chemotherapy    Ileostomy present (HCC) 06/12/2020   Impaired fasting glucose 02/02/2020   Rectal adenocarcinoma (HCC) 08/21/2019   Injury of tendon of triceps 03/03/2019   Mixed hyperlipidemia 07/26/2018   Low back pain 09/29/2017   Essential hypertension 04/01/2017   Renal insufficiency 03/31/2017   Dyslipidemia 03/31/2017   HIV disease (HCC) 11/11/2016   Tinea versicolor 04/22/2016    Patient's Medications  New Prescriptions   No medications on file  Previous Medications   AMLODIPINE  (NORVASC ) 5 MG TABLET    Take 5 mg by mouth daily.   ATORVASTATIN  (LIPITOR) 40 MG TABLET    Take 1 tablet (40 mg total) by mouth daily.   BICTEGRAVIR-EMTRICITABINE -TENOFOVIR  AF (BIKTARVY ) 50-200-25 MG TABS TABLET    TAKE 1 TABLET BY MOUTH DAILY.   LOSARTAN  (COZAAR ) 25 MG TABLET    Take 25 mg by mouth daily.    MULTIPLE VITAMIN (MULTIVITAMIN) TABLET    Take 1 tablet by mouth daily.  Modified Medications   No medications on file  Discontinued Medications   No medications on file    Subjective: Joshua Zhang is in for his routine HIV (heterosexual) follow-up visit.  He has been seen by dr Elaine previously  06/28/24 id clinic f/u Patient recently dx'ed lung met from colon cancer No issue with biktarvy  No other complaint    06/29/23 id clinic f/u Due for colonoscopy this coming year Pcp to manage age-appropriate cancer screening No concern today Reviewed meds only 10 mg lipitor -- discussed reprieve No other question No missed dose biktarvy  last 4 weeks Planning to go home in [place] ([place] this christmas   12/29/22 id clinic f/u First visit with me Taking biktarvy /compliant No complaint today Hx rectal cancer stage ct3n1, sp 8 cycles neoadjuvant folfox RT, s/p surgery  03/2020 clear margin, no residual cancer and ileostomy take down done 05/2020 -- routine f/u ct 08/2022 no evidence disease. 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 He was from [place] originally. Not previously to [place], but been to [place], [place], and all over the [place] .  Review of Systems: Review of Systems  Constitutional:  Negative for fever and weight loss.  Psychiatric/Behavioral:  Negative for depression.     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    Port-A-Cath in place 10/12/2019   rectal ca dx'd 06/2019   Renal insufficiency     Social History   Tobacco Use   Smoking status: Former    Current packs/day: 0.00    Average packs/day: 0.3 packs/day for 5.0 years (1.3 ttl pk-yrs)    Types: Cigarettes    Start date: 57    Quit date: 2003    Years since quitting: 22.9   Smokeless tobacco: Never  Tobacco comments:    Quit 18 years ago  Vaping Use   Vaping status: Never Used  Substance Use Topics   Alcohol use: Yes    Comment: 5-6 driniks per week    Drug use: Not Currently    Family History  Problem Relation Age of Onset   Breast cancer Mother    Aortic aneurysm Father     No Known Allergies  Health Maintenance  Topic Date Due   Hepatitis B Vaccines 19-59 Average Risk (3 of 3 - 19+ 3-dose series) 08/05/2017   Influenza Vaccine  02/18/2024   COVID-19 Vaccine (4 - 2025-26 season) 03/20/2024   Pneumococcal Vaccine: 50+ Years (2 of 2 - PCV) 06/08/2025 (Originally 02/02/2018)   Colonoscopy  09/06/2030    DTaP/Tdap/Td (4 - Td or Tdap) 09/27/2032   Hepatitis C Screening  Completed   HIV Screening  Completed   Zoster Vaccines- Shingrix  Completed   HPV VACCINES  Aged Out   Meningococcal B Vaccine  Aged Out    Objective:  Vitals:   06/28/24 0944  BP: (!) 143/90  Pulse: 86  Temp: 98.2 F (36.8 C)  TempSrc: Oral  SpO2: 98%  Weight: 213 lb (96.6 kg)   Body mass index is 30.56 kg/m.  Physical Exam Constitutional:      Comments: He is in good spirits.  Cardiovascular:     Rate and Rhythm: Normal rate and regular rhythm.     Heart sounds: No murmur heard. Pulmonary:     Effort: Pulmonary effort is normal.     Breath sounds: Normal breath sounds.  Psychiatric:        Mood and Affect: Mood normal.     Lab Results Lab Results  Component Value Date   WBC 5.3 06/06/2024   HGB 15.6 06/06/2024   HCT 41.7 06/06/2024   MCV 94.1 06/06/2024   PLT 177 06/06/2024    Lab Results  Component Value Date   CREATININE 1.09 06/06/2024   BUN 16 06/06/2024   NA 139 06/06/2024   K 4.0 06/06/2024   CL 105 06/06/2024   CO2 28 06/06/2024    Lab Results  Component Value Date   ALT 40 06/06/2024   AST 37 06/06/2024   ALKPHOS 76 06/06/2024   BILITOT 1.3 (H) 06/06/2024    Lab Results  Component Value Date   CHOL 224 (H) 12/15/2022   HDL 47 12/15/2022   LDLCALC 127 (H) 12/15/2022   TRIG 351 (H) 12/15/2022   CHOLHDL 4.8 12/15/2022   Lab Results  Component Value Date   LABRPR NON-REACTIVE 12/15/2022   HIV 1 RNA Quant  Date Value  06/29/2023 Not Detected Copies/mL  12/15/2022 <20 Copies/mL (H)  12/11/2021 NOT DETECTED copies/mL   CD4 T Cell Abs (/uL)  Date Value  12/15/2022 291 (L)  12/11/2021 376 (L)  12/24/2020 248 (L)     Problem List Items Addressed This Visit     HIV disease (HCC) - Primary   Relevant Medications   bictegravir-emtricitabine -tenofovir  AF (BIKTARVY ) 50-200-25 MG TABS tablet   Other Relevant Orders   T-helper cells (CD4) count   HIV 1 RNA quant-no  reflex-bld   Other Visit Diagnoses       Malignant neoplasm of colon, unspecified part of colon (HCC)       Relevant Medications   bictegravir-emtricitabine -tenofovir  AF (BIKTARVY ) 50-200-25 MG TABS tablet         #hiv #reprieve -- increased lipitor from 10 to 40 mg 06/29/23    06/28/24.  No missed dose last 4 weeks. Doing well on biktarvy . compliant    -discussed u=u -encourage compliance -continue current HIV medication biktaryvy -labs today -f/u in 12 months    #colon cancer No evidence disease  Repeat colonoscopy 04/2024 no evidence of disease  However lung nodules had bronchoscopy and biopsy 06/20/24 and that showed concerning colonic adeno met   -f/u onc     #hcm -vaccination Prevnar 20 today 06/28/2024 Td 2024 Meningococcal 2018 Hep b by 2018 -hepatitis Retest antibody hep b level today 06/2023; 2018 hep b and c serology negative -std Deferred -- monogamous relationship -cancer screening See above     Constance ONEIDA Passer, MD [facility] [phone] pager   [phone] cell 06/28/2024, 9:56 AM

## 2024-06-28 NOTE — Progress Notes (Addendum)
 [facility]   [phone] [phone]   Clinic Follow up Note   Patient Care Team: Joshua Zhang as PCP - General (Physician Assistant) Debby Hila, MD as Consulting Physician (General Surgery) Lanny Callander, MD as Consulting Physician (Hematology) Dewey Rush, MD as Consulting Physician (Radiation Oncology)  Date of Service:  06/28/2024  CHIEF COMPLAINT: f/u of colon cancer with mets to lungs   CURRENT THERAPY:  Pending   Oncology History   Rectal adenocarcinoma (HCC) cT3N1M0, Stage IIIB, MMR normal, ypT0N0 -low rectum mass found on screening colonoscopy in 06/2019. Biopsy showed moderately differentiated adenocarcinoma. Staged cT3N1 by MRI -s/p 8 cycles neoadjuvant FOLFOX, then concurrent  chemoRT with Xeloda .  -s/p surgery with Dr Debby on 04/03/20, path showed complete response with no residual carcinoma. Ileostomy takedown 06/12/20 (which caused hernia requiring repair) -surveillance colonoscopy 09/06/20 with Dr. Kristie was benign. He will reach out regarding repeat. -most recent CT AP from September 10, 2022 showed no evidence of cancer recurrence, new 5 mm left upper lobe pulmonary nodule, indeterminate.   -follow up CT chest on 05/31/2023 showed multiple stable lung nodules except the RUL nodule is slightly more prominent  - Due to the slowly enlarging two bilateral upper lobe lung nodules, he underwent bronchoscopy and biopsy, both showed adenocarcinoma, consistent with metastasis from colon cancer.  PET scan was negative for adenopathy or other metastasis.  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 The disease has demonstrated indolent behavior with slow growth over 3 years, without nodal or distant metastasis on PET scan. This represents a late recurrence, which  is uncommon but possible. Curative intent remains the goal. SBRT is favored due to high efficacy, ease, and minimal recovery time compared to bilateral surgery, which is more invasive. Chemotherapy is not favored given the indolent nature and low sensitivity to cytotoxic agents. Anticipated cure rate with SBRT exceeds 90%, with low risk of local recurrence. Risk of recurrence elsewhere is certainly possible but considered low given disease stability. Circulating tumor DNA testing (Signatera) will further assess for molecular evidence of recurrence and guide post-treatment surveillance. - Presented case at multidisciplinary tumor board for consensus on management. - Ordered Signatera circulating tumor DNA assay using prior rectal tumor tissue and arranged blood draw for next Monday at noon. - Discussed treatment options including SBRT, surgery, and ablation; will finalize plan after tumor board discussion. - If SBRT is chosen, will coordinate with radiation oncology for initiation, with flexibility to start after Christmas per his preference. - Plan for post-treatment surveillance with CT scans every six months for three years, then annually up to five years. - Will determine follow-up provider (medical oncology vs. radiation oncology) for ongoing surveillance after treatment. - If Signatera remains positive after SBRT, will consider further evaluation including colonoscopy to rule out other sites of recurrence.  Plan - I personally reviewed his multiple CT and PET scan images, and discussed the biopsy results with patient and his wife - I will present his case in sorest at the tumor conference tomorrow, to see what the consensus from tumor board.  I will call him after the conference - Will proceed Signatera test this week, and continue monitoring every 3 months after treatment.  Addendum - Case reviewed in thoracic tumor board, the consensus is SBRT radiation, surgery is not recommended due to the  bilateral lesions. -Spoke with his wife, she will  talk to him and message us  back by MyChart, he will likely agree with the radiation. -Referral was sent to his radiation oncologist Dr. Dewey -Patient is also concerned about the enlarged heart on the PET scan, I will order an echocardiogram, and encouraged him to follow-up with PCP -I plan to see him back in 4 months with lab including Signatera, and CT chest without contrast   SUMMARY OF ONCOLOGIC HISTORY: Oncology History Overview Note  Cancer Staging Rectal adenocarcinoma [facility]) Staging form: Colon and Rectum, AJCC 8th Edition - Clinical stage from 08/21/2019: Stage IIIB (cT3, cN1, cM0) - Signed by Lanny Callander, MD on 08/21/2019 Stage prefix: Initial diagnosis Histologic grade (G): G2 Histologic grading system: 4 grade system    Rectal adenocarcinoma (HCC)  07/03/2019 Procedure   Colonoscopy by Dr. Kristie  IMPRESSION -Likely Malignant 5cm, circumferential, bleeding tumor in the rectum, 304 cm from the anal verge -biopsy done  -One 8 mm sessile polyp in the distal sigmoid colon, removed with a hot snare x2; resected and retrieved.  -Two small sessile polyps, 1 in the mid-descending colon and 1 in the mid transverse colon - removed by cold biopsies.  -One 7mm sessile polyp in the mid transverse colon, removed with a hot snare x1; resected and retrieved.  -One 8mm sessile polyp in the proximal ascending colon, removed with a hot snare X1; resected and retrieved.  -few large scattered diverticula.   07/03/2019 Initial Biopsy   FINAL DIAGNOSIS 07/03/19  A. Colon, Descending, Polyp, Polypectomy:   -TUBULAR ADENOMA   -No high grade dysplasia or malignancy.  B. Colon, transverse, polyp, polypectomy:   -TUBULAR ADENOMA  -No high grade dysplasia or malignancy. C. Colon, Ascending, Polyp, Polypectomy:   -Fragments of sessile serrated adenoma/polyp D. Colon, sigmoid, polyp, polypectomy:   -Fragments of Tubular Adenoma  -No high grade dysplasia  or malignancy. E. Rectum, Mass, Biopsy:   -INVASIVE COLONIC ADENOCARCINOMA, MODERATELY-DIFFERENTIATED, see comment     07/12/2019 Imaging   CT AP W Contrast 07/12/19  IMPRESSION: 1. No evidence of metastatic disease within the chest, abdomen or pelvis. 2. Possible mild wall thickening in the sigmoid colon, although no focal colonic mass is identified. There are few distal colonic diverticula. 3. Sequela of prior granulomatous disease. 4. Possible cholelithiasis with mild wall thickening of the gallbladder fundus. 5. Aortic Atherosclerosis (ICD10-I70.0).   08/21/2019 Initial Diagnosis   Rectal adenocarcinoma (HCC)   08/21/2019 Cancer Staging   Staging form: Colon and Rectum, AJCC 8th Edition - Clinical stage from 08/21/2019: Stage IIIB (cT3, cN1, cM0) - Signed by Lanny Callander, MD on 08/21/2019   08/31/2019 - 12/07/2019 Chemotherapy   FOLFOX q2weeks starting 08/31/19. Removed 5FU bolus and increased steroids pre-meds due to thrombocytopenia and skin rash starting with C4. Completed 12/07/19    11/21/2019 Imaging   CT AP W contrast  IMPRESSION: 1. Stable exam. No evidence for metastatic disease within the abdomen or pelvis. 2. Splenomegaly.  New from previous exam. 3. Gallstones.   Aortic Atherosclerosis (ICD10-I70.0).   12/25/2019 - 02/01/2020 Chemotherapy   Concurrent chemoRT with Xeloda  2000mg  in the AM and 1500mg  in the PM on days of RT  12/25/19-7/15/2   12/25/2019 - 02/01/2020 Radiation Therapy   Concurrent  chemoRT with Dr Dewey 12/25/19-02/01/20   04/03/2020 Surgery   XI ROBOTIC ASSISTED LOWER ANTERIOR RESECTION, SPLENIC FLEXURE MOBILIZATION, RIGID PROCTOSCOPY and DIVERTING LOOP ILEOSTOMY with Dr Georgean and Dr Sheldon   04/03/2020 Pathology Results   FINAL MICROSCOPIC DIAGNOSIS:   A. RECTOSIGMOID COLON, LOW ANTERIOR  RESECTION:  - No residual invasive carcinoma status post neoadjuvant treatment.  - See oncology table.   B. FINAL DISTAL MARGIN, EXCISION:  - Unremarkable colonic mucosa.     06/12/2020 Surgery   ILEOSTOMY REVERSAL and PAC removal by Dr Debby    11/26/2020 Imaging   CT C/A/P  IMPRESSION: 1. Changes of prior low anterior resection. Soft tissue nodularity in the presacral space/mesorectal fat, which are nonspecific and may reflect post treatment/surgical changes but disease involvement not excluded. Attention on close interval follow-up exams is recommended. 2. No evidence of metastatic disease within the chest. 3. Similar splenomegaly. 4. Cholelithiasis without evidence of acute cholecystitis. 5. Aortic atherosclerosis.   02/26/2021 Imaging   IMPRESSION: 1. Status post low anterior resection. Nodularity in the presacral space is stable to minimally increased in the interval. Continued close attention recommended as recurrent disease not excluded. 2. Cholelithiasis. 3. Aortic Atherosclerosis (ICD10-I70.0).   03/21/2021 PET scan   IMPRESSION: 1. Small presacral nodule which appears minimally more conspicuous since May 2022, measuring 9 mm today compared to 6 mm than shows low level hypermetabolism. Continued close attention on follow-up recommended as recurrent/metastatic disease is a concern. 2. Hypermetabolic FDG uptake in the anal region likely physiologic. Attention on follow-up recommended. 3. No evidence for distant metastases in the chest, abdomen, or pelvis. 4. Hepatic steatosis. 5. Cholelithiasis. 6.  Aortic Atherosclerois (ICD10-170.0)   04/29/2021 Pathology Results   FINAL MICROSCOPIC DIAGNOSIS:   A. LYMPH NODE, PRE-SACRAL, BIOPSY:  - Reactive lymphoid hyperplasia  - No metastatic carcinoma identified    08/19/2021 Imaging   EXAM: CT ABDOMEN AND PELVIS WITH CONTRAST  IMPRESSION: 1. No new suspicious mass or lymphadenopathy identified in the abdomen or pelvis. 2. Postsurgical changes of the lower rectum. Decreased size of presacral nodular densities measuring up to 6 mm in short axis. 3. Colonic diverticulosis. 4. Other ancillary  findings as described.      Discussed the use of AI scribe software for clinical note transcription with the patient, who gave verbal consent to proceed.  History of Present Illness Joshua Zhang is a 53 year old male with metastatic colorectal adenocarcinoma to the lungs who presents for follow-up and discussion of management options for progressive pulmonary metastases.  He was diagnosed with colorectal adenocarcinoma approximately five years ago and completed rectal radiation. Pulmonary nodules were identified in 2022 and were radiographically stable until the past year, when interval growth was noted. Last week he underwent bronchoscopy with biopsy of right middle lobe and left upper lobe nodules, which both confirmed metastatic adenocarcinoma with immunohistochemistry consistent with gastrointestinal origin. PET scan showed both nodules to be grossly stable, mildly hypermetabolic with SUV 3.5, and no lymph node or distant metastases.  He is currently asymptomatic without cough, dyspnea, or gastrointestinal complaints. He is not receiving chemotherapy. He has remote light tobacco use over 30 years ago and reports good pulmonary function.  Management options of SBRT, surgery, and ablation were discussed. He is not due for colonoscopy and has no symptoms warranting earlier endoscopic evaluation.     All other systems were reviewed with the patient and are negative.  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    Port-A-Cath in place 10/12/2019   rectal ca dx'd 06/2019   Renal insufficiency     SURGICAL HISTORY: Past Surgical History:  Procedure Laterality Date   APPENDECTOMY     53 years old    BRONCHIAL NEEDLE ASPIRATION BIOPSY  06/20/2024   Procedure: BRONCHOSCOPY, WITH NEEDLE ASPIRATION BIOPSY;  Surgeon: Shelah Lamar RAMAN, MD;  Location: [facility] ENDOSCOPY;  Service: Pulmonary;;   CRYOTHERAPY  06/20/2024   Procedure: CRYOTHERAPY;  Surgeon: Shelah Lamar RAMAN, MD;  Location: [facility] ENDOSCOPY;  Service: Pulmonary;;   DIVERTING ILEOSTOMY N/A 04/03/2020   Procedure: DIVERTING LOOP ILEOSTOMY;  Surgeon: Debby Hila, MD;  Location: [facility] ORS;  Service: General;  Laterality: N/A;   FIDUCIAL MARKER PLACEMENT  06/20/2024   Procedure: INSERTION, FIDUCIAL MARKERS;  Surgeon: Shelah Lamar RAMAN, MD;  Location: [facility] ENDOSCOPY;  Service: Pulmonary;;   FINGER SURGERY Right    ILEOSTOMY CLOSURE N/A 06/12/2020   Procedure: ILEOSTOMY REVERSAL;  Surgeon: Debby Hila, MD;  Location: [facility] ORS;  Service: General;  Laterality: N/A;   INCISIONAL HERNIA REPAIR N/A 09/26/2020   Procedure: LAPAROSCOPIC INCISIONAL HERNIA REPAIR WITH MESH;  Surgeon: Debby Hila, MD;  Location: [facility] ORS;  Service: General;  Laterality: N/A;   IR IMAGING GUIDED PORT INSERTION  08/30/2019   PORT-A-CATH REMOVAL Right 06/12/2020   Procedure: REMOVAL PORT-A-CATH;  Surgeon: Debby Hila, MD;  Location: [facility] ORS;  Service: General;  Laterality: Right;   SHOULDER SURGERY Bilateral 53 years old   VIDEO BRONCHOSCOPY WITH ENDOBRONCHIAL NAVIGATION Bilateral 06/20/2024   Procedure: VIDEO BRONCHOSCOPY WITH ENDOBRONCHIAL NAVIGATION;  Surgeon: Shelah Lamar RAMAN, MD;  Location: [facility] ENDOSCOPY;  Service: Pulmonary;  Laterality: Bilateral;   WISDOM TOOTH EXTRACTION     XI ROBOTIC ASSISTED LOWER ANTERIOR RESECTION N/A 04/03/2020   Procedure: XI ROBOTIC ASSISTED LOWER ANTERIOR RESECTION, SPLENIC FLEXURE MOBILIZATION, RIGID PROCTOSCOPY;  Surgeon: Debby Hila, MD;  Location: [facility] ORS;  Service: General;  Laterality: N/A;    I have reviewed the social history and family history with the patient and they are unchanged from previous note.  ALLERGIES:  has no known allergies.  MEDICATIONS:  Current Outpatient Medications  Medication Sig Dispense Refill   amLODipine  (NORVASC ) 5 MG tablet Take 5  mg by mouth daily.     atorvastatin  (LIPITOR) 40 MG tablet Take 1 tablet (40 mg total) by mouth daily. 30 tablet 11   bictegravir-emtricitabine -tenofovir  AF (BIKTARVY ) 50-200-25 MG TABS tablet Take 1 tablet by mouth daily. 30 tablet 11   losartan  (COZAAR ) 25 MG tablet Take 25 mg by mouth daily.      Multiple Vitamin (MULTIVITAMIN) tablet Take 1 tablet by mouth daily.     No current facility-administered medications for this visit.    PHYSICAL EXAMINATION: ECOG PERFORMANCE STATUS: 0 - Asymptomatic  Vitals:   06/28/24 1118  BP: (!) 136/91  Pulse: 81  Resp: 17  Temp: 98.3 F (36.8 C)  SpO2: 98%   Wt Readings from Last 3 Encounters:  06/28/24 213 lb 8 oz (96.8 kg)  06/28/24 213 lb (96.6 kg)  06/20/24 200 lb (90.7 kg)     GENERAL:alert, no distress and comfortable SKIN: skin color, texture, turgor are normal, no rashes or significant lesions EYES: normal, Conjunctiva are pink and non-injected, sclera clear NECK: supple, thyroid normal size, non-tender, without nodularity LYMPH:  no palpable lymphadenopathy in the cervical, axillary  LUNGS: clear to auscultation and percussion with normal breathing effort HEART: regular rate & rhythm and no murmurs and no lower extremity edema ABDOMEN:abdomen soft, non-tender and normal bowel sounds Musculoskeletal:no cyanosis of digits and no clubbing  NEURO: alert & oriented x 3 with fluent speech, no focal motor/sensory deficits  Physical Exam    LABORATORY DATA:  I have reviewed the data as listed    Latest Ref Rng & Units 06/06/2024   10:51 AM 06/03/2023    8:30 AM 12/07/2022   10:53 AM  CBC  WBC 4.0 - 10.5 K/uL 5.3  4.7  5.8   Hemoglobin 13.0 - 17.0 g/dL 84.3  83.1  85.6   Hematocrit 39.0 - 52.0 % 41.7  46.3  39.0   Platelets 150 - 400 K/uL 177  201  195         Latest Ref Rng & Units 06/06/2024   10:51 AM 06/03/2023    8:30 AM 12/15/2022    9:00 AM  CMP  Glucose 70 - 99 mg/dL 95  97  893   BUN 6 - 20 mg/dL 16  9  15     Creatinine 0.61 - 1.24 mg/dL 8.90  8.94  8.96   Sodium 135 - 145 mmol/L 139  140  139   Potassium 3.5 - 5.1 mmol/L 4.0  4.0  4.0   Chloride 98 - 111 mmol/L 105  108  106   CO2 22 - 32 mmol/L 28  26  25    Calcium  8.9 - 10.3 mg/dL 9.1  9.2  9.3   Total Protein 6.5 - 8.1 g/dL 6.6  6.8  6.3   Total Bilirubin 0.0 - 1.2 mg/dL 1.3  1.0  0.7   Alkaline Phos 38 - 126 U/L 76  77    AST 15 - 41 U/L 37  25  29   ALT 0 - 44 U/L 40  29  38       RADIOGRAPHIC STUDIES: I have personally reviewed the radiological images as listed and agreed with the findings in the report. No results found.    No orders of the defined types were placed in this encounter.  All questions were answered. The patient knows to call the clinic with any problems, questions or concerns. No barriers to learning was detected. The total time spent in the appointment was 60 minutes, including review of chart and various tests results, discussions about plan of care and coordination of care plan     Onita Mattock, MD 06/28/2024

## 2024-06-28 NOTE — Patient Instructions (Signed)
 Just a couple tests today  See your oncologist in follow up   Continue biktarvy    Vaccine today: Pneumonia (prevnar 20)

## 2024-06-28 NOTE — Assessment & Plan Note (Signed)
 cT3N1M0, Stage IIIB, MMR normal, ypT0N0 -low rectum mass found on screening colonoscopy in 06/2019. Biopsy showed moderately differentiated adenocarcinoma. Staged cT3N1 by MRI -s/p 8 cycles neoadjuvant FOLFOX, then concurrent  chemoRT with Xeloda .  -s/p surgery with Dr Debby on 04/03/20, path showed complete response with no residual carcinoma. Ileostomy takedown 06/12/20 (which caused hernia requiring repair) -surveillance colonoscopy 09/06/20 with Dr. Kristie was benign. He will reach out regarding repeat. -most recent CT AP from September 10, 2022 showed no evidence of cancer recurrence, new 5 mm left upper lobe pulmonary nodule, indeterminate.   -follow up CT chest on 05/31/2023 showed multiple stable lung nodules except the RUL nodule is slightly more prominent  - Due to the slowly enlarging two bilateral upper lobe lung nodules, he underwent bronchoscopy and biopsy, both showed adenocarcinoma, consistent with metastasis from colon cancer.  PET scan was negative for adenopathy or other metastasis.

## 2024-06-28 NOTE — Addendum Note (Signed)
 Addended by: Kyandra Mcclaine M on: 06/28/2024 11:21 AM   Modules accepted: Orders

## 2024-06-29 ENCOUNTER — Ambulatory Visit: Admitting: Emergency Medicine

## 2024-06-29 ENCOUNTER — Other Ambulatory Visit: Payer: Self-pay

## 2024-06-29 LAB — T-HELPER CELLS (CD4) COUNT (NOT AT ARMC)
CD4 % Helper T Cell: 31 % — ABNORMAL LOW (ref 33–65)
CD4 T Cell Abs: 246 /uL — ABNORMAL LOW (ref 400–1790)

## 2024-06-29 NOTE — Addendum Note (Signed)
 Addended by: LANNY CALLANDER on: 06/29/2024 08:18 AM   Modules accepted: Orders

## 2024-06-30 ENCOUNTER — Other Ambulatory Visit: Payer: Self-pay

## 2024-06-30 DIAGNOSIS — C20 Malignant neoplasm of rectum: Secondary | ICD-10-CM

## 2024-06-30 LAB — HIV-1 RNA QUANT-NO REFLEX-BLD
HIV 1 RNA Quant: NOT DETECTED {copies}/mL
HIV-1 RNA Quant, Log: NOT DETECTED {Log_copies}/mL

## 2024-06-30 NOTE — Progress Notes (Signed)
 As per Dr. Lanny, order placed successfully in portal for [employer]. All paperwork uploaded with the order. Kit taken to lab to be drawn Monday 12/15.

## 2024-07-01 ENCOUNTER — Encounter: Payer: Self-pay | Admitting: Hematology

## 2024-07-01 NOTE — Progress Notes (Signed)
 The proposed treatment discussed in conference is for discussion purpose only and is not a binding recommendation.  The patients have not been physically examined, or presented with their treatment options.  Therefore, final treatment plans cannot be decided.

## 2024-07-03 ENCOUNTER — Other Ambulatory Visit: Payer: Self-pay

## 2024-07-03 ENCOUNTER — Inpatient Hospital Stay

## 2024-07-03 DIAGNOSIS — C19 Malignant neoplasm of rectosigmoid junction: Secondary | ICD-10-CM | POA: Diagnosis not present

## 2024-07-03 DIAGNOSIS — C20 Malignant neoplasm of rectum: Secondary | ICD-10-CM

## 2024-07-03 LAB — CBC WITH DIFFERENTIAL (CANCER CENTER ONLY)
Abs Immature Granulocytes: 0.01 K/uL (ref 0.00–0.07)
Basophils Absolute: 0 K/uL (ref 0.0–0.1)
Basophils Relative: 1 %
Eosinophils Absolute: 0.1 K/uL (ref 0.0–0.5)
Eosinophils Relative: 1 %
HCT: 43.8 % (ref 39.0–52.0)
Hemoglobin: 16.4 g/dL (ref 13.0–17.0)
Immature Granulocytes: 0 %
Lymphocytes Relative: 19 %
Lymphs Abs: 1 K/uL (ref 0.7–4.0)
MCH: 35.5 pg — ABNORMAL HIGH (ref 26.0–34.0)
MCHC: 37.4 g/dL — ABNORMAL HIGH (ref 30.0–36.0)
MCV: 94.8 fL (ref 80.0–100.0)
Monocytes Absolute: 0.4 K/uL (ref 0.1–1.0)
Monocytes Relative: 7 %
Neutro Abs: 4 K/uL (ref 1.7–7.7)
Neutrophils Relative %: 72 %
Platelet Count: 195 K/uL (ref 150–400)
RBC: 4.62 MIL/uL (ref 4.22–5.81)
RDW: 11.9 % (ref 11.5–15.5)
WBC Count: 5.5 K/uL (ref 4.0–10.5)
nRBC: 0 % (ref 0.0–0.2)

## 2024-07-03 LAB — CMP (CANCER CENTER ONLY)
ALT: 42 U/L (ref 0–44)
AST: 45 U/L — ABNORMAL HIGH (ref 15–41)
Albumin: 4.3 g/dL (ref 3.5–5.0)
Alkaline Phosphatase: 95 U/L (ref 38–126)
Anion gap: 12 (ref 5–15)
BUN: 15 mg/dL (ref 6–20)
CO2: 21 mmol/L — ABNORMAL LOW (ref 22–32)
Calcium: 9.3 mg/dL (ref 8.9–10.3)
Chloride: 103 mmol/L (ref 98–111)
Creatinine: 1.15 mg/dL (ref 0.61–1.24)
GFR, Estimated: >60 mL/min (ref >60)
Glucose, Bld: 110 mg/dL — ABNORMAL HIGH (ref 70–99)
Potassium: 3.8 mmol/L (ref 3.5–5.1)
Sodium: 136 mmol/L (ref 135–145)
Total Bilirubin: 1.2 mg/dL (ref 0.0–1.2)
Total Protein: 7 g/dL (ref 6.5–8.1)

## 2024-07-03 LAB — CEA (ACCESS): CEA (CHCC): 1.6 ng/mL (ref 0.00–5.00)

## 2024-07-03 LAB — GENETIC SCREENING ORDER

## 2024-07-03 NOTE — Progress Notes (Signed)
 Sent Secure Chat message to [facility]  scheduler to contact pt to get him scheduled for the Echocardiogram Dr Lanny order for the pt's based off pt's last PET Scan results.

## 2024-07-04 ENCOUNTER — Other Ambulatory Visit: Payer: Self-pay

## 2024-07-04 LAB — CYTOLOGY - NON PAP

## 2024-07-06 ENCOUNTER — Other Ambulatory Visit: Payer: Self-pay

## 2024-07-06 ENCOUNTER — Other Ambulatory Visit (HOSPITAL_COMMUNITY): Payer: Self-pay

## 2024-07-06 NOTE — Progress Notes (Signed)
 Specialty Pharmacy Refill Coordination Note  Spoke with Joshua Zhang is a 53 y.o. male contacted today regarding refills of specialty medication(s) Bictegravir-Emtricitab-Tenofov (BIKTARVY )  Doses on hand: 7  Patient requested: Delivery   Delivery date: 07/10/24   Verified address: [address] [place] [address]  Medication will be filled on 07/07/24

## 2024-07-06 NOTE — Progress Notes (Signed)
 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  He is s/p neoadjuvant chemotherapy and surgery in 2021.  He was noted to have pulmonary nodules in 2022.  He is here today to discuss radiation therapy to the bilateral pulmonary nodules.  Biopsies of Bilateral Lungs 06/20/2024     Past/Anticipated interventions by pulmonary, if any:  Past/Anticipated interventions by cardiothoracic surgery, if any:    Past/Anticipated interventions by medical oncology, if any:  Dr. Lanny 06/28/2024 -SBRT is favored due to high efficacy, ease, and minimal recovery time compared to bilateral surgery, which is more invasive. Chemotherapy is not favored given the indolent nature and low sensitivity to cytotoxic agents.  - Ordered [employer] circulating tumor DNA assay using prior rectal tumor tissue and arranged blood draw for next Monday at noon. - Discussed treatment options including SBRT, surgery, and ablation; will finalize plan after tumor board discussion. - If SBRT is chosen, will coordinate with radiation oncology for initiation, with flexibility to start after Christmas per his preference. - Plan for post-treatment surveillance with CT scans every six months for three years, then annually up to five years.   Tobacco/Marijuana/Snuff/ETOH use: Former smoker  Signs/Symptoms Weight changes, if any: Stable Respiratory complaints, if any: No Hemoptysis, if any: He reports non-productive cough, dry, hacky.  On occasion he coughs up clear/yellowish/white thick sputum. Pain issues, if any:  Denies chest pain, pressure, or tightness.  SAFETY ISSUES: Prior radiation? Rectal 2021 Pacemaker/ICD? No Possible current pregnancy? N/a Is the patient on methotrexate? No  Current Complaints / other details:

## 2024-07-06 NOTE — Progress Notes (Incomplete)
 GI Location of Tumor / Histology: ***  Joshua Zhang presented *** months ago with symptoms of: ***  Biopsies of *** (if applicable) revealed: {:18581}  Past/Anticipated interventions by surgeon, if any: {:18581}  Past/Anticipated interventions by medical oncology, if any: {:18581}  Weight changes, if any: {:18581}  Bowel/Bladder complaints, if any: {:18581}  Nausea / Vomiting, if any: {:18581}  Pain issues, if any:  {:18581}  Any blood per rectum:   {:18581}  Anal Patient -Abnormal pap smear  SAFETY ISSUES: Prior radiation? {:18581} Pacemaker/ICD? {:18581} Possible current pregnancy? {:18581} Is the patient on methotrexate? {:18581}  Current Complaints/Details:

## 2024-07-07 ENCOUNTER — Other Ambulatory Visit: Payer: Self-pay | Admitting: Internal Medicine

## 2024-07-07 ENCOUNTER — Encounter: Payer: Self-pay | Admitting: Radiation Oncology

## 2024-07-07 ENCOUNTER — Encounter: Payer: Self-pay | Admitting: Hematology

## 2024-07-07 ENCOUNTER — Other Ambulatory Visit: Payer: Self-pay

## 2024-07-07 ENCOUNTER — Ambulatory Visit
Admission: RE | Admit: 2024-07-07 | Discharge: 2024-07-07 | Disposition: A | Discharge status: Home / Self Care | Source: Ambulatory Visit | Attending: Radiation Oncology | Admitting: Radiation Oncology

## 2024-07-07 VITALS — BP 161/101 | HR 93 | Temp 97.8°F | Resp 18 | Ht 70.0 in | Wt 209.4 lb

## 2024-07-07 DIAGNOSIS — I10 Essential (primary) hypertension: Secondary | ICD-10-CM | POA: Diagnosis not present

## 2024-07-07 DIAGNOSIS — T451X5A Adverse effect of antineoplastic and immunosuppressive drugs, initial encounter: Secondary | ICD-10-CM | POA: Diagnosis not present

## 2024-07-07 DIAGNOSIS — I2510 Atherosclerotic heart disease of native coronary artery without angina pectoris: Secondary | ICD-10-CM | POA: Insufficient documentation

## 2024-07-07 DIAGNOSIS — N2889 Other specified disorders of kidney and ureter: Secondary | ICD-10-CM | POA: Diagnosis not present

## 2024-07-07 DIAGNOSIS — C7802 Secondary malignant neoplasm of left lung: Secondary | ICD-10-CM | POA: Diagnosis present

## 2024-07-07 DIAGNOSIS — G620 Drug-induced polyneuropathy: Secondary | ICD-10-CM | POA: Diagnosis not present

## 2024-07-07 DIAGNOSIS — Z79899 Other long term (current) drug therapy: Secondary | ICD-10-CM | POA: Insufficient documentation

## 2024-07-07 DIAGNOSIS — Z9221 Personal history of antineoplastic chemotherapy: Secondary | ICD-10-CM | POA: Insufficient documentation

## 2024-07-07 DIAGNOSIS — I700 Atherosclerosis of aorta: Secondary | ICD-10-CM | POA: Diagnosis not present

## 2024-07-07 DIAGNOSIS — N289 Disorder of kidney and ureter, unspecified: Secondary | ICD-10-CM | POA: Diagnosis not present

## 2024-07-07 DIAGNOSIS — B20 Human immunodeficiency virus [HIV] disease: Secondary | ICD-10-CM

## 2024-07-07 DIAGNOSIS — Z87891 Personal history of nicotine dependence: Secondary | ICD-10-CM | POA: Insufficient documentation

## 2024-07-07 DIAGNOSIS — C20 Malignant neoplasm of rectum: Secondary | ICD-10-CM | POA: Insufficient documentation

## 2024-07-07 DIAGNOSIS — C7801 Secondary malignant neoplasm of right lung: Secondary | ICD-10-CM | POA: Diagnosis not present

## 2024-07-07 DIAGNOSIS — Z923 Personal history of irradiation: Secondary | ICD-10-CM | POA: Diagnosis not present

## 2024-07-07 DIAGNOSIS — Z803 Family history of malignant neoplasm of breast: Secondary | ICD-10-CM | POA: Diagnosis not present

## 2024-07-07 DIAGNOSIS — N3289 Other specified disorders of bladder: Secondary | ICD-10-CM | POA: Diagnosis not present

## 2024-07-07 DIAGNOSIS — E785 Hyperlipidemia, unspecified: Secondary | ICD-10-CM

## 2024-07-07 DIAGNOSIS — Z79624 Long term (current) use of inhibitors of nucleotide synthesis: Secondary | ICD-10-CM | POA: Diagnosis not present

## 2024-07-07 NOTE — Progress Notes (Cosign Needed)
 " Radiation Oncology         [phone] ________________________________  Name: Joshua Zhang        MRN: [id number]  Date of Service: 07/07/2024 DOB: Aug 24, 1970  CC:[name] Lanny Onita, MD     REFERRING PHYSICIAN: Lanny Onita, MD   DIAGNOSIS: The encounter diagnosis was Malignant neoplasm metastatic to both lungs [facility]).   Bilateral pulmonary metastases from stage IIIb (cT3, N1, M0) rectal adenocarcinoma   HISTORY OF PRESENT ILLNESS: Joshua Zhang is a 53 y.o. male seen at the request of Dr. Lanny for pulmonary nodules from metastatic colorectal adenocarcinoma.  Patient was originally diagnosed with stage IIIb (cT3, N1, M0) rectal adenocarcinoma in 2020.  He is status post 8 cycles of neoadjuvant FOLFOX, concurrent chemoradiation, and surgery with Dr. Debby on 04/03/2020.  He continued under surveillance with Dr. Lanny.  CTAP on 09/10/2022 demonstrated no evidence of cancer recurrence however a new 5 mm left upper lobe pulmonary nodule was noted.  Follow-up CT of the chest on 05/31/2023 demonstrated stable lung nodules, except the right upper lobe nodule was slightly more prominent.  Given these findings, he underwent bronchoscopy and biopsy on 04/20/2024.  Surgical pathology of the LUL and RML nodules demonstrated adenocarcinoma, consistent with metastasis from colon cancer.  PET scan on 06/21/2024 was negative for adenopathy or any other metastatic disease.  Patient's case was discussed at our thoracic tumor board.  He was not thought to be a surgical or ablation candidate at this time.  He was kindly referred to us  radiation treatment options.  Patient reports to be doing well overall today. He notes a chronic, non-productive cough but denies any shortness of breath, hemoptysis, or chest pain.   PREVIOUS RADIATION THERAPY: Yes   12/25/19 - 02/01/20:   The patient was treated to the pelvis to a dose of 45 Gy at 1.8 Gy per fraction under the care of Dr.  Dewey.   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    Port-A-Cath in place 10/12/2019   rectal ca dx'd 06/2019   Renal insufficiency        PAST SURGICAL HISTORY: Past Surgical History:  Procedure Laterality Date   APPENDECTOMY     53 years old    BRONCHIAL NEEDLE ASPIRATION BIOPSY  06/20/2024   Procedure: BRONCHOSCOPY, WITH NEEDLE ASPIRATION BIOPSY;  Surgeon: Shelah Lamar RAMAN, MD;  Location: [facility] ENDOSCOPY;  Service: Pulmonary;;   CRYOTHERAPY  06/20/2024   Procedure: CRYOTHERAPY;  Surgeon: Shelah Lamar RAMAN, MD;  Location: [facility] ENDOSCOPY;  Service: Pulmonary;;   DIVERTING ILEOSTOMY N/A 04/03/2020   Procedure: DIVERTING LOOP ILEOSTOMY;  Surgeon: Debby Hila, MD;  Location: [facility] ORS;  Service: General;  Laterality: N/A;   FIDUCIAL MARKER PLACEMENT  06/20/2024   Procedure: INSERTION, FIDUCIAL MARKERS;  Surgeon: Shelah Lamar RAMAN, MD;  Location: [facility] ENDOSCOPY;  Service: Pulmonary;;   FINGER SURGERY Right    ILEOSTOMY CLOSURE N/A 06/12/2020   Procedure: ILEOSTOMY REVERSAL;  Surgeon: Debby Hila, MD;  Location: [facility] ORS;  Service: General;  Laterality: N/A;   INCISIONAL HERNIA REPAIR N/A 09/26/2020   Procedure: LAPAROSCOPIC INCISIONAL HERNIA REPAIR WITH MESH;  Surgeon: Debby Hila, MD;  Location: [facility] ORS;  Service: General;  Laterality: N/A;   IR IMAGING GUIDED PORT INSERTION  08/30/2019   PORT-A-CATH  REMOVAL Right 06/12/2020   Procedure: REMOVAL PORT-A-CATH;  Surgeon: Debby Hila, MD;  Location: [facility] ORS;  Service: General;  Laterality: Right;   SHOULDER SURGERY Bilateral 53 years old   VIDEO BRONCHOSCOPY WITH ENDOBRONCHIAL NAVIGATION Bilateral 06/20/2024   Procedure: VIDEO BRONCHOSCOPY WITH ENDOBRONCHIAL NAVIGATION;  Surgeon: Shelah Lamar RAMAN, MD;  Location: [facility] ENDOSCOPY;  Service: Pulmonary;  Laterality: Bilateral;   WISDOM TOOTH  EXTRACTION     XI ROBOTIC ASSISTED LOWER ANTERIOR RESECTION N/A 04/03/2020   Procedure: XI ROBOTIC ASSISTED LOWER ANTERIOR RESECTION, SPLENIC FLEXURE MOBILIZATION, RIGID PROCTOSCOPY;  Surgeon: Debby Hila, MD;  Location: [facility] ORS;  Service: General;  Laterality: N/A;     FAMILY HISTORY:  Family History  Problem Relation Age of Onset   Breast cancer Mother    Aortic aneurysm Father      SOCIAL HISTORY:  reports that he quit smoking about 22 years ago. His smoking use included cigarettes. He started smoking about 27 years ago. He has a 1.3 pack-year smoking history. He has never used smokeless tobacco. He reports current alcohol use. He reports that he does not currently use drugs.   ALLERGIES: Patient has no known allergies.   MEDICATIONS:  Current Outpatient Medications  Medication Sig Dispense Refill   amLODipine  (NORVASC ) 5 MG tablet Take 5 mg by mouth daily.     atorvastatin  (LIPITOR) 40 MG tablet Take 1 tablet (40 mg total) by mouth daily. 30 tablet 11   bictegravir-emtricitabine -tenofovir  AF (BIKTARVY ) 50-200-25 MG TABS tablet Take 1 tablet by mouth daily. 30 tablet 11   losartan  (COZAAR ) 25 MG tablet Take 25 mg by mouth daily.      Multiple Vitamin (MULTIVITAMIN) tablet Take 1 tablet by mouth daily.     No current facility-administered medications for this encounter.     REVIEW OF SYSTEMS: Notable for that above.      PHYSICAL EXAM:  Wt Readings from Last 3 Encounters:  07/07/24 209 lb 6.4 oz (95 kg)  06/28/24 213 lb 8 oz (96.8 kg)  06/28/24 213 lb (96.6 kg)   Temp Readings from Last 3 Encounters:  07/07/24 97.8 F (36.6 C)  06/28/24 98.3 F (36.8 C)  06/28/24 98.2 F (36.8 C) (Oral)   BP Readings from Last 3 Encounters:  07/07/24 (!) 161/101  06/28/24 (!) 136/91  06/28/24 (!) 143/90   Pulse Readings from Last 3 Encounters:  07/07/24 93  06/28/24 81  06/28/24 86   Pain Assessment Pain Score: 0-No pain/10  In general this is a well appearing male  in no acute distress. He's alert and oriented x4 and appropriate throughout the examination. Cardiopulmonary assessment is negative for acute distress and he exhibits normal effort.     ECOG = 0  0 - Asymptomatic (Fully active, able to carry on all predisease activities without restriction)  1 - Symptomatic but completely ambulatory (Restricted in physically strenuous activity but ambulatory and able to carry out work of a light or sedentary nature. For example, light housework, office work)  2 - Symptomatic, <50% in bed during the day (Ambulatory and capable of all self care but unable to carry out any work activities. Up and about more than 50% of waking hours)  3 - Symptomatic, >50% in bed, but not bedbound (Capable of only limited self-care, confined to bed or chair 50% or more of waking hours)  4 - Bedbound (Completely disabled. Cannot carry on any self-care. Totally confined to bed or chair)  5 - Death   Raylene CLORINDA [facility],  [place], et al. [phone]). Toxicity and response criteria of the [facility]. Am. DOROTHA Bridges. Oncol. 5 (6): 649-55    LABORATORY DATA:  Lab Results  Component Value Date   WBC 5.5 07/03/2024   HGB 16.4 07/03/2024   HCT 43.8 07/03/2024   MCV 94.8 07/03/2024   PLT 195 07/03/2024   Lab Results  Component Value Date   NA 136 07/03/2024   K 3.8 07/03/2024   CL 103 07/03/2024   CO2 21 (L) 07/03/2024   Lab Results  Component Value Date   ALT 42 07/03/2024   AST 45 (H) 07/03/2024   ALKPHOS 95 07/03/2024   BILITOT 1.2 07/03/2024      RADIOGRAPHY: NM PET Image Restag (PS) Skull Base To Thigh Result Date: 06/26/2024 CLINICAL DATA:  Subsequent treatment strategy for rectal cancer with enlarging lung nodules. EXAM: NUCLEAR MEDICINE PET SKULL BASE TO THIGH TECHNIQUE: 10.0 mCi F-18 FDG was injected intravenously. Full-ring PET imaging was performed from the skull base to thigh after the radiotracer. CT data was obtained and used for  attenuation correction and anatomic localization. Fasting blood glucose: 90 for mg/dl COMPARISON:  CT chest 88/96/7974, CT abdomen pelvis 08/19/2021, PET 03/21/2021. FINDINGS: Mediastinal blood pool activity: SUV max 2.0 Liver activity: SUV max NA NECK: No abnormal hypermetabolism. Incidental CT findings: None. CHEST: Mildly hypermetabolic apical left upper lobe nodule measures 6 x 10 mm (7/18), similar to 05/22/2024, SUV max 3.5. New fiducial marker with focal ground-glass seen posteriorly. Minimally hypermetabolic right middle lobe nodule is seen with associated fiducial marker, measuring roughly 10 x 12 mm (7/36), stable, with SUV max 2.4. New adjacent ground-glass in the right middle lobe. No additional abnormal hypermetabolism. Incidental CT findings: Atherosclerotic calcification of the aorta with age advanced involvement of all 3 coronary arteries. Heart is enlarged. No pericardial or pleural effusion. Calcified granulomas. ABDOMEN/PELVIS: No abnormal hypermetabolism. Incidental CT findings: Gallstones. Small low-attenuation lesion in the right kidney. No specific follow-up necessary. Distal ileal anastomosis, lower anterior resection. Bladder wall thickening. SKELETON: No abnormal hypermetabolism. Incidental CT findings: Degenerative changes in the spine. IMPRESSION: 1. Right middle and left upper lobe nodules are grossly stable in size with new associated fiducial markers and adjacent changes of radiation therapy. Findings are compatible with biopsy-proven metastatic colon cancer. No evidence of new metastatic disease. 2.  Age advanced three-vessel coronary artery calcification. 3. Cholelithiasis. 4. Bladder wall thickening. 5.  Aortic atherosclerosis (ICD10-I70.0). Electronically Signed   By: Newell Eke M.D.   On: 06/26/2024 11:47   DG Chest Port 1 View Result Date: 06/20/2024 EXAM: 1 VIEW(S) XRAY OF THE CHEST 06/20/2024 04:30:00 PM COMPARISON: Chest CT 05/22/2024. CLINICAL HISTORY: S/P  bronchoscopy with biopsy. FINDINGS: LINES, TUBES AND DEVICES: NONE. LUNGS AND PLEURA: No focal pulmonary opacity. No pleural effusion. No pneumothorax. Small metal clips are present in the left upper lobe and right parahilar region, likely related to recent bronchoscopy with biopsies. HEART AND MEDIASTINUM: No acute abnormality of the cardiac and mediastinal silhouettes. BONES AND SOFT TISSUES: No acute osseous abnormality. IMPRESSION: 1. Small metal clips over the right parahilar region and left upper lobe, likely related to recent bronchoscopy with biopsies. No Pneumothorax . Electronically signed by: Toribio Agreste MD 06/20/2024 04:50 PM EST RP Workstation: [id number]   DG C-ARM BRONCHOSCOPY Result Date: 06/20/2024 C-ARM BRONCHOSCOPY: Fluoroscopy was utilized by the requesting physician.  No radiographic interpretation.       IMPRESSION/PLAN: 1. Bilateral pulmonary metastases from stage IIIb (cT3, N1, M0) rectal adenocarcinoma  We reviewed this patient's current workup.  He presents today with biopsy-proven pulmonary metastases in the LUL and RML of the lung.  PET imaging fortunately demonstrates no other evidence of metastatic disease.  He is not thought to be a good surgical or ablative treatment at this time.  Dr. Dewey recommends stereotactic body radiotherapy (SBRT) to the pulmonary nodules.   Today, we talked to the patient and family about the findings and work-up thus far.  We discussed the natural history of pulmonary metastases and general treatment, highlighting the role of radiotherapy in the management.  We discussed the available radiation techniques, and focused on the details of logistics and delivery.  We reviewed the anticipated acute and late sequelae associated with radiation in this setting.  The patient was encouraged to ask questions that I answered to the best of my ability. A patient consent form was discussed and signed.  We retained a copy for our records.  The patient would  like to proceed with radiation and will be scheduled for CT simulation.    In a visit lasting 60 minutes, greater than 50% of the time was spent face to face discussing the patient's condition, in preparation for the discussion, and coordinating the patient's care.   The above documentation reflects my direct findings during this shared patient visit. Please see the separate note by Dr. Dewey on this date for the remainder of the patient's plan of care.    Leeroy Due, PA-C    **Disclaimer: This note was dictated with voice recognition software. Similar sounding words can inadvertently be transcribed and this note may contain transcription errors which may not have been corrected upon publication of note.**  "

## 2024-07-21 ENCOUNTER — Ambulatory Visit
Admission: RE | Admit: 2024-07-21 | Discharge: 2024-07-21 | Disposition: A | Discharge status: Home / Self Care | Source: Ambulatory Visit | Attending: Radiation Oncology | Admitting: Radiation Oncology

## 2024-07-21 DIAGNOSIS — C7802 Secondary malignant neoplasm of left lung: Secondary | ICD-10-CM | POA: Diagnosis present

## 2024-07-21 DIAGNOSIS — C20 Malignant neoplasm of rectum: Secondary | ICD-10-CM | POA: Diagnosis present

## 2024-07-21 DIAGNOSIS — C7801 Secondary malignant neoplasm of right lung: Secondary | ICD-10-CM | POA: Diagnosis present

## 2024-07-26 ENCOUNTER — Ambulatory Visit: Payer: Self-pay | Admitting: Nurse Practitioner

## 2024-07-26 ENCOUNTER — Ambulatory Visit (HOSPITAL_COMMUNITY)
Admission: RE | Admit: 2024-07-26 | Discharge: 2024-07-26 | Disposition: A | Discharge status: Home / Self Care | Source: Ambulatory Visit | Attending: Radiation Oncology | Admitting: Radiation Oncology

## 2024-07-26 DIAGNOSIS — I119 Hypertensive heart disease without heart failure: Secondary | ICD-10-CM | POA: Diagnosis present

## 2024-07-26 DIAGNOSIS — I517 Cardiomegaly: Secondary | ICD-10-CM

## 2024-07-26 DIAGNOSIS — E785 Hyperlipidemia, unspecified: Secondary | ICD-10-CM | POA: Diagnosis not present

## 2024-07-26 DIAGNOSIS — C20 Malignant neoplasm of rectum: Secondary | ICD-10-CM | POA: Diagnosis not present

## 2024-07-26 LAB — ECHOCARDIOGRAM COMPLETE
Area-P 1/2: 3.95 cm2
Calc EF: 53.8 %
S' Lateral: 4 cm
Single Plane A2C EF: 55.5 %
Single Plane A4C EF: 51.2 %

## 2024-07-26 NOTE — Progress Notes (Signed)
" °  Echocardiogram 2D Echocardiogram has been performed.  Joshua Zhang 07/26/2024, 11:48 AM "

## 2024-07-27 ENCOUNTER — Other Ambulatory Visit (HOSPITAL_COMMUNITY): Payer: Self-pay

## 2024-07-27 DIAGNOSIS — C7802 Secondary malignant neoplasm of left lung: Secondary | ICD-10-CM | POA: Diagnosis not present

## 2024-07-29 LAB — SIGNATERA
SIGNATERA MTM READOUT: 0 MTM/ml
SIGNATERA TEST RESULT: NEGATIVE

## 2024-08-01 ENCOUNTER — Other Ambulatory Visit: Payer: Self-pay

## 2024-08-02 ENCOUNTER — Encounter (INDEPENDENT_AMBULATORY_CARE_PROVIDER_SITE_OTHER): Payer: Self-pay

## 2024-08-02 ENCOUNTER — Other Ambulatory Visit: Payer: Self-pay

## 2024-08-02 ENCOUNTER — Other Ambulatory Visit: Payer: Self-pay | Admitting: Pharmacy Technician

## 2024-08-02 NOTE — Progress Notes (Signed)
 Specialty Pharmacy Refill Coordination Note  Joshua Zhang is a 54 y.o. male contacted today regarding refills of specialty medication(s) Bictegravir-Emtricitab-Tenofov (BIKTARVY )   Patient requested Delivery   Delivery date: 08/08/24   Verified address: [address] [place] [address]   Medication will be filled on: 08/07/24

## 2024-08-07 ENCOUNTER — Other Ambulatory Visit: Payer: Self-pay

## 2024-08-07 ENCOUNTER — Ambulatory Visit
Admission: RE | Admit: 2024-08-07 | Discharge: 2024-08-07 | Disposition: A | Source: Ambulatory Visit | Attending: Radiation Oncology | Admitting: Radiation Oncology

## 2024-08-07 DIAGNOSIS — C7802 Secondary malignant neoplasm of left lung: Secondary | ICD-10-CM | POA: Diagnosis not present

## 2024-08-07 LAB — RAD ONC ARIA SESSION SUMMARY
Course Elapsed Days: 0
Plan Fractions Treated to Date: 1
Plan Fractions Treated to Date: 1
Plan Prescribed Dose Per Fraction: 12 Gy
Plan Prescribed Dose Per Fraction: 12 Gy
Plan Total Fractions Prescribed: 5
Plan Total Fractions Prescribed: 5
Plan Total Prescribed Dose: 60 Gy
Plan Total Prescribed Dose: 60 Gy
Reference Point Dosage Given to Date: 12 Gy
Reference Point Dosage Given to Date: 12 Gy
Reference Point Session Dosage Given: 12 Gy
Reference Point Session Dosage Given: 12 Gy
Session Number: 1

## 2024-08-08 ENCOUNTER — Ambulatory Visit: Admitting: Radiation Oncology

## 2024-08-09 ENCOUNTER — Ambulatory Visit
Admission: RE | Admit: 2024-08-09 | Discharge: 2024-08-09 | Disposition: A | Source: Ambulatory Visit | Attending: Radiation Oncology | Admitting: Radiation Oncology

## 2024-08-09 ENCOUNTER — Other Ambulatory Visit: Payer: Self-pay

## 2024-08-09 DIAGNOSIS — C7802 Secondary malignant neoplasm of left lung: Secondary | ICD-10-CM | POA: Diagnosis not present

## 2024-08-09 LAB — RAD ONC ARIA SESSION SUMMARY
Course Elapsed Days: 2
Plan Fractions Treated to Date: 2
Plan Fractions Treated to Date: 2
Plan Prescribed Dose Per Fraction: 12 Gy
Plan Prescribed Dose Per Fraction: 12 Gy
Plan Total Fractions Prescribed: 5
Plan Total Fractions Prescribed: 5
Plan Total Prescribed Dose: 60 Gy
Plan Total Prescribed Dose: 60 Gy
Reference Point Dosage Given to Date: 24 Gy
Reference Point Dosage Given to Date: 24 Gy
Reference Point Session Dosage Given: 12 Gy
Reference Point Session Dosage Given: 12 Gy
Session Number: 2

## 2024-08-10 ENCOUNTER — Ambulatory Visit: Admitting: Radiation Oncology

## 2024-08-11 ENCOUNTER — Ambulatory Visit
Admission: RE | Admit: 2024-08-11 | Discharge: 2024-08-11 | Disposition: A | Source: Ambulatory Visit | Attending: Radiation Oncology | Admitting: Radiation Oncology

## 2024-08-11 ENCOUNTER — Other Ambulatory Visit: Payer: Self-pay

## 2024-08-11 ENCOUNTER — Ambulatory Visit
Admission: RE | Admit: 2024-08-11 | Discharge: 2024-08-11 | Disposition: A | Source: Ambulatory Visit | Attending: Radiation Oncology

## 2024-08-11 DIAGNOSIS — C7802 Secondary malignant neoplasm of left lung: Secondary | ICD-10-CM | POA: Diagnosis not present

## 2024-08-11 LAB — RAD ONC ARIA SESSION SUMMARY
Course Elapsed Days: 4
Plan Fractions Treated to Date: 3
Plan Fractions Treated to Date: 3
Plan Prescribed Dose Per Fraction: 12 Gy
Plan Prescribed Dose Per Fraction: 12 Gy
Plan Total Fractions Prescribed: 5
Plan Total Fractions Prescribed: 5
Plan Total Prescribed Dose: 60 Gy
Plan Total Prescribed Dose: 60 Gy
Reference Point Dosage Given to Date: 36 Gy
Reference Point Dosage Given to Date: 36 Gy
Reference Point Session Dosage Given: 12 Gy
Reference Point Session Dosage Given: 12 Gy
Session Number: 3

## 2024-08-15 ENCOUNTER — Ambulatory Visit
Admission: RE | Admit: 2024-08-15 | Discharge: 2024-08-15 | Disposition: A | Source: Ambulatory Visit | Attending: Radiation Oncology | Admitting: Radiation Oncology

## 2024-08-15 ENCOUNTER — Other Ambulatory Visit: Payer: Self-pay

## 2024-08-15 DIAGNOSIS — C7802 Secondary malignant neoplasm of left lung: Secondary | ICD-10-CM | POA: Diagnosis not present

## 2024-08-15 LAB — RAD ONC ARIA SESSION SUMMARY
Course Elapsed Days: 8
Plan Fractions Treated to Date: 4
Plan Fractions Treated to Date: 4
Plan Prescribed Dose Per Fraction: 12 Gy
Plan Prescribed Dose Per Fraction: 12 Gy
Plan Total Fractions Prescribed: 5
Plan Total Fractions Prescribed: 5
Plan Total Prescribed Dose: 60 Gy
Plan Total Prescribed Dose: 60 Gy
Reference Point Dosage Given to Date: 48 Gy
Reference Point Dosage Given to Date: 48 Gy
Reference Point Session Dosage Given: 12 Gy
Reference Point Session Dosage Given: 12 Gy
Session Number: 4

## 2024-08-17 ENCOUNTER — Ambulatory Visit
Admission: RE | Admit: 2024-08-17 | Discharge: 2024-08-17 | Disposition: A | Discharge status: Home / Self Care | Source: Ambulatory Visit | Attending: Radiation Oncology | Admitting: Radiation Oncology

## 2024-08-17 ENCOUNTER — Other Ambulatory Visit: Payer: Self-pay

## 2024-08-17 LAB — RAD ONC ARIA SESSION SUMMARY
Course Elapsed Days: 10
Plan Fractions Treated to Date: 5
Plan Fractions Treated to Date: 5
Plan Prescribed Dose Per Fraction: 12 Gy
Plan Prescribed Dose Per Fraction: 12 Gy
Plan Total Fractions Prescribed: 5
Plan Total Fractions Prescribed: 5
Plan Total Prescribed Dose: 60 Gy
Plan Total Prescribed Dose: 60 Gy
Reference Point Dosage Given to Date: 60 Gy
Reference Point Dosage Given to Date: 60 Gy
Reference Point Session Dosage Given: 12 Gy
Reference Point Session Dosage Given: 12 Gy
Session Number: 5

## 2024-08-18 NOTE — Radiation Completion Notes (Signed)
 Patient Name: Joshua Zhang, Joshua Zhang MRN: [id number] Date of Birth: [birth year] Referring Physician: ONITA MATTOCK, M.D. Date of Service: 2024-08-18 Radiation Oncologist: Norleen Limes, M.D. 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 Staging on 2019-08-21: Rectal adenocarcinoma (HCC) T=cT3, N=cN1, M=cM0 Intent: Palliative     ==========DELIVERED PLANS==========  First Treatment Date: 2024-08-07 Last Treatment Date: 2024-08-17   Plan Name: Lung_LUL_SBRT Site: Lung, Left Technique: SBRT/SRT-IMRT Mode: Photon Dose Per Fraction: 12 Gy Prescribed Dose (Delivered / Prescribed): 60 Gy / 60 Gy Prescribed Fxs (Delivered / Prescribed): 5 / 5   Plan Name: Lung_RML_SBRT Site: Lung, Right Technique: SBRT/SRT-IMRT Mode: Photon Dose Per Fraction: 12 Gy Prescribed Dose (Delivered / Prescribed): 60 Gy / 60 Gy Prescribed Fxs (Delivered / Prescribed): 5 / 5     ==========ON TREATMENT VISIT DATES========== 2024-08-07, 2024-08-07, 2024-08-09, 2024-08-11, 2024-08-11, 2024-08-11, 2024-08-15, 2024-08-15, 2024-08-17, 2024-08-17     ==========UPCOMING VISITS========== 10/19/2024 [facility] IMAGING CT CHEST WO CONTRAST [facility] 2  08/31/2024 [facility] EST PT 30 Ann Hedger K, NP  08/30/2024 [facility] LAB ONLY [facility] LAB  08/30/2024 [facility] GEN COUNSEL 60 Woodard Burnard BROCKS, Counselor        ==========APPENDIX - ON TREATMENT VISIT NOTES==========   See weekly On Treatment Notes in Epic for details in the Media tab (listed as Progress notes on the On Treatment Visit Dates listed above).

## 2024-08-30 ENCOUNTER — Inpatient Hospital Stay

## 2024-08-30 ENCOUNTER — Inpatient Hospital Stay: Attending: Hematology | Admitting: Genetic Counselor

## 2024-08-30 ENCOUNTER — Inpatient Hospital Stay: Admitting: Hematology

## 2024-08-31 ENCOUNTER — Inpatient Hospital Stay: Admitting: Nurse Practitioner

## 2024-10-19 ENCOUNTER — Other Ambulatory Visit (HOSPITAL_COMMUNITY)

## 2025-06-28 ENCOUNTER — Ambulatory Visit: Admitting: Internal Medicine
